# Patient Record
Sex: Male | Born: 1935 | Race: Black or African American | Hispanic: No | Marital: Single | Smoking: Never smoker
Health system: Southern US, Community
[De-identification: ages and names within clinical notes are randomized; demographics above are authoritative.]

## PROBLEM LIST (undated history)

## (undated) DIAGNOSIS — K5790 Diverticulosis of intestine, part unspecified, without perforation or abscess without bleeding: Secondary | ICD-10-CM

## (undated) DIAGNOSIS — Z0000 Encounter for general adult medical examination without abnormal findings: Secondary | ICD-10-CM

## (undated) DIAGNOSIS — E785 Hyperlipidemia, unspecified: Secondary | ICD-10-CM

## (undated) DIAGNOSIS — I252 Old myocardial infarction: Secondary | ICD-10-CM

## (undated) DIAGNOSIS — N19 Unspecified kidney failure: Secondary | ICD-10-CM

## (undated) DIAGNOSIS — D649 Anemia, unspecified: Secondary | ICD-10-CM

## (undated) DIAGNOSIS — I714 Abdominal aortic aneurysm, without rupture, unspecified: Secondary | ICD-10-CM

## (undated) DIAGNOSIS — C61 Malignant neoplasm of prostate: Secondary | ICD-10-CM

## (undated) DIAGNOSIS — M7989 Other specified soft tissue disorders: Secondary | ICD-10-CM

## (undated) DIAGNOSIS — M7020 Olecranon bursitis, unspecified elbow: Secondary | ICD-10-CM

## (undated) DIAGNOSIS — I10 Essential (primary) hypertension: Secondary | ICD-10-CM

## (undated) HISTORY — DX: Abdominal aortic aneurysm, without rupture, unspecified: I71.40

## (undated) HISTORY — DX: Unspecified kidney failure: N19

## (undated) HISTORY — DX: Diverticulosis of intestine, part unspecified, without perforation or abscess without bleeding: K57.90

## (undated) HISTORY — DX: Anemia, unspecified: D64.9

## (undated) HISTORY — DX: Malignant neoplasm of prostate: C61

## (undated) HISTORY — DX: Old myocardial infarction: I25.2

## (undated) HISTORY — DX: Other specified soft tissue disorders: M79.89

## (undated) HISTORY — DX: Olecranon bursitis, unspecified elbow: M70.20

## (undated) HISTORY — DX: Essential (primary) hypertension: I10

## (undated) HISTORY — DX: Abdominal aortic aneurysm, without rupture: I71.4

## (undated) HISTORY — DX: Hyperlipidemia, unspecified: E78.5

## (undated) HISTORY — DX: Encounter for general adult medical examination without abnormal findings: Z00.00

---

## 1997-09-22 ENCOUNTER — Ambulatory Visit: Admission: RE | Admit: 1997-09-22 | Discharge: 1997-09-22 | Payer: Self-pay | Admitting: Vascular Surgery

## 1997-12-20 ENCOUNTER — Inpatient Hospital Stay (HOSPITAL_COMMUNITY): Admission: RE | Admit: 1997-12-20 | Discharge: 1997-12-24 | Payer: Self-pay | Admitting: Vascular Surgery

## 1998-02-25 HISTORY — PX: OTHER SURGICAL HISTORY: SHX169

## 2000-04-15 ENCOUNTER — Emergency Department (HOSPITAL_COMMUNITY): Admission: EM | Admit: 2000-04-15 | Discharge: 2000-04-15 | Payer: Self-pay | Admitting: Emergency Medicine

## 2000-04-23 ENCOUNTER — Encounter: Payer: Self-pay | Admitting: Internal Medicine

## 2003-02-16 ENCOUNTER — Emergency Department (HOSPITAL_COMMUNITY): Admission: EM | Admit: 2003-02-16 | Discharge: 2003-02-16 | Payer: Self-pay | Admitting: Emergency Medicine

## 2004-01-27 ENCOUNTER — Ambulatory Visit: Payer: Self-pay | Admitting: Internal Medicine

## 2004-02-26 HISTORY — PX: PROSTATE CRYOABLATION: SUR358

## 2004-02-26 HISTORY — PX: SUPRAPUBIC CATHETER PLACEMENT: SHX2473

## 2004-02-26 HISTORY — PX: COLONOSCOPY: SHX174

## 2004-03-08 ENCOUNTER — Ambulatory Visit: Payer: Self-pay | Admitting: Internal Medicine

## 2004-04-19 ENCOUNTER — Ambulatory Visit: Payer: Self-pay | Admitting: Internal Medicine

## 2004-06-13 ENCOUNTER — Encounter: Admission: RE | Admit: 2004-06-13 | Discharge: 2004-06-13 | Payer: Self-pay | Admitting: Urology

## 2004-07-19 ENCOUNTER — Ambulatory Visit: Payer: Self-pay | Admitting: Internal Medicine

## 2004-08-17 ENCOUNTER — Ambulatory Visit (HOSPITAL_COMMUNITY): Admission: RE | Admit: 2004-08-17 | Discharge: 2004-08-17 | Payer: Self-pay | Admitting: Urology

## 2004-10-30 ENCOUNTER — Ambulatory Visit: Payer: Self-pay | Admitting: Internal Medicine

## 2004-12-17 ENCOUNTER — Ambulatory Visit: Payer: Self-pay | Admitting: Internal Medicine

## 2005-03-21 ENCOUNTER — Ambulatory Visit: Payer: Self-pay | Admitting: Internal Medicine

## 2005-06-21 ENCOUNTER — Ambulatory Visit: Payer: Self-pay | Admitting: Internal Medicine

## 2005-09-19 ENCOUNTER — Ambulatory Visit: Payer: Self-pay | Admitting: Internal Medicine

## 2005-12-20 ENCOUNTER — Ambulatory Visit: Payer: Self-pay | Admitting: Internal Medicine

## 2006-01-21 ENCOUNTER — Ambulatory Visit: Payer: Self-pay | Admitting: Internal Medicine

## 2006-02-12 ENCOUNTER — Ambulatory Visit: Payer: Self-pay | Admitting: Gastroenterology

## 2006-02-28 ENCOUNTER — Ambulatory Visit: Payer: Self-pay | Admitting: Gastroenterology

## 2006-04-15 ENCOUNTER — Ambulatory Visit: Payer: Self-pay | Admitting: Internal Medicine

## 2006-04-15 LAB — CONVERTED CEMR LAB
CO2: 34 meq/L — ABNORMAL HIGH (ref 19–32)
Calcium: 9.3 mg/dL (ref 8.4–10.5)
Chloride: 101 meq/L (ref 96–112)
GFR calc non Af Amer: 52 mL/min
Glucose, Bld: 120 mg/dL — ABNORMAL HIGH (ref 70–99)

## 2006-07-02 ENCOUNTER — Ambulatory Visit: Payer: Self-pay | Admitting: Internal Medicine

## 2006-07-02 LAB — CONVERTED CEMR LAB
Chloride: 107 meq/L (ref 96–112)
Creatinine, Ser: 1.6 mg/dL — ABNORMAL HIGH (ref 0.4–1.5)
Glucose, Bld: 91 mg/dL (ref 70–99)
Sodium: 144 meq/L (ref 135–145)

## 2006-12-05 DIAGNOSIS — R609 Edema, unspecified: Secondary | ICD-10-CM

## 2006-12-05 DIAGNOSIS — I252 Old myocardial infarction: Secondary | ICD-10-CM

## 2006-12-05 DIAGNOSIS — I10 Essential (primary) hypertension: Secondary | ICD-10-CM

## 2006-12-05 DIAGNOSIS — M109 Gout, unspecified: Secondary | ICD-10-CM

## 2006-12-05 DIAGNOSIS — K5730 Diverticulosis of large intestine without perforation or abscess without bleeding: Secondary | ICD-10-CM | POA: Insufficient documentation

## 2006-12-05 DIAGNOSIS — E785 Hyperlipidemia, unspecified: Secondary | ICD-10-CM

## 2006-12-05 DIAGNOSIS — C61 Malignant neoplasm of prostate: Secondary | ICD-10-CM

## 2007-01-23 ENCOUNTER — Ambulatory Visit: Payer: Self-pay | Admitting: Internal Medicine

## 2007-01-23 LAB — CONVERTED CEMR LAB
ALT: 25 units/L (ref 0–53)
Albumin: 4 g/dL (ref 3.5–5.2)
Alkaline Phosphatase: 56 units/L (ref 39–117)
BUN: 24 mg/dL — ABNORMAL HIGH (ref 6–23)
Basophils Absolute: 0 10*3/uL (ref 0.0–0.1)
Basophils Relative: 0.6 % (ref 0.0–1.0)
Bilirubin Urine: NEGATIVE
Calcium: 8.9 mg/dL (ref 8.4–10.5)
GFR calc Af Amer: 54 mL/min
GFR calc non Af Amer: 45 mL/min
HDL: 37.1 mg/dL — ABNORMAL LOW (ref >39.0)
Hemoglobin, Urine: NEGATIVE
Hemoglobin: 12.1 g/dL — ABNORMAL LOW (ref 13.0–17.0)
Leukocytes, UA: NEGATIVE
Lymphocytes Relative: 20.6 % (ref 12.0–46.0)
MCHC: 34.8 g/dL (ref 30.0–36.0)
Monocytes Absolute: 0.7 10*3/uL (ref 0.2–0.7)
Monocytes Relative: 9.6 % (ref 3.0–11.0)
Neutro Abs: 4.7 10*3/uL (ref 1.4–7.7)
Platelets: 174 10*3/uL (ref 150–400)
Potassium: 3.6 meq/L (ref 3.5–5.1)
Pro B Natriuretic peptide (BNP): 151 pg/mL — ABNORMAL HIGH (ref 0.0–100.0)
Specific Gravity, Urine: 1.01 (ref 1.000–1.03)
TSH: 2.43 microintl units/mL (ref 0.35–5.50)
Total CHOL/HDL Ratio: 4.9
Total Protein, Urine: NEGATIVE mg/dL
Total Protein: 7.8 g/dL (ref 6.0–8.3)
Triglycerides: 62 mg/dL (ref 0–149)
Urine Glucose: NEGATIVE mg/dL
VLDL: 12 mg/dL (ref 0–40)
pH: 6 (ref 5.0–8.0)

## 2007-02-27 ENCOUNTER — Encounter: Payer: Self-pay | Admitting: Internal Medicine

## 2007-07-30 ENCOUNTER — Ambulatory Visit: Payer: Self-pay | Admitting: Internal Medicine

## 2007-07-31 LAB — CONVERTED CEMR LAB
CO2: 30 meq/L (ref 19–32)
Chloride: 100 meq/L (ref 96–112)
Creatinine, Ser: 1.5 mg/dL (ref 0.4–1.5)
GFR calc non Af Amer: 48 mL/min
Potassium: 3.6 meq/L (ref 3.5–5.1)
Sodium: 139 meq/L (ref 135–145)

## 2007-08-27 ENCOUNTER — Encounter: Payer: Self-pay | Admitting: Internal Medicine

## 2007-09-18 ENCOUNTER — Encounter: Payer: Self-pay | Admitting: Internal Medicine

## 2008-02-01 ENCOUNTER — Ambulatory Visit: Payer: Self-pay | Admitting: Internal Medicine

## 2008-02-15 ENCOUNTER — Ambulatory Visit: Payer: Self-pay | Admitting: Internal Medicine

## 2008-03-15 ENCOUNTER — Encounter: Payer: Self-pay | Admitting: Internal Medicine

## 2008-03-16 ENCOUNTER — Inpatient Hospital Stay (HOSPITAL_COMMUNITY): Admission: EM | Admit: 2008-03-16 | Discharge: 2008-03-17 | Payer: Self-pay | Admitting: Emergency Medicine

## 2008-03-16 ENCOUNTER — Ambulatory Visit: Payer: Self-pay | Admitting: Internal Medicine

## 2008-03-16 ENCOUNTER — Ambulatory Visit: Payer: Self-pay | Admitting: Pulmonary Disease

## 2008-03-17 ENCOUNTER — Telehealth (INDEPENDENT_AMBULATORY_CARE_PROVIDER_SITE_OTHER): Payer: Self-pay | Admitting: *Deleted

## 2008-03-25 ENCOUNTER — Ambulatory Visit: Payer: Self-pay | Admitting: Internal Medicine

## 2008-03-25 LAB — CONVERTED CEMR LAB
Basophils Relative: 0.4 % (ref 0.0–3.0)
Eosinophils Relative: 1.4 % (ref 0.0–5.0)
HCT: 32.4 % — ABNORMAL LOW (ref 39.0–52.0)
Hemoglobin: 11.1 g/dL — ABNORMAL LOW (ref 13.0–17.0)
Monocytes Absolute: 0.7 10*3/uL (ref 0.1–1.0)
Monocytes Relative: 10.2 % (ref 3.0–12.0)
Neutro Abs: 4.8 10*3/uL (ref 1.4–7.7)
WBC: 7.2 10*3/uL (ref 4.5–10.5)

## 2008-03-29 ENCOUNTER — Ambulatory Visit: Payer: Self-pay | Admitting: Internal Medicine

## 2008-05-12 ENCOUNTER — Ambulatory Visit: Payer: Self-pay | Admitting: Internal Medicine

## 2008-05-12 DIAGNOSIS — D638 Anemia in other chronic diseases classified elsewhere: Secondary | ICD-10-CM | POA: Insufficient documentation

## 2008-05-13 ENCOUNTER — Telehealth (INDEPENDENT_AMBULATORY_CARE_PROVIDER_SITE_OTHER): Payer: Self-pay | Admitting: *Deleted

## 2008-05-13 LAB — CONVERTED CEMR LAB
ALT: 28 units/L (ref 0–53)
Basophils Relative: 0.5 % (ref 0.0–3.0)
Bilirubin, Direct: 0.1 mg/dL (ref 0.0–0.3)
Chloride: 101 meq/L (ref 96–112)
Creatinine, Ser: 1.6 mg/dL — ABNORMAL HIGH (ref 0.4–1.5)
Eosinophils Absolute: 0.1 10*3/uL (ref 0.0–0.7)
LDL Cholesterol: 73 mg/dL (ref 0–99)
MCHC: 33.9 g/dL (ref 30.0–36.0)
MCV: 93.2 fL (ref 78.0–100.0)
Monocytes Absolute: 0.9 10*3/uL (ref 0.1–1.0)
Neutro Abs: 6.1 10*3/uL (ref 1.4–7.7)
Neutrophils Relative %: 71.6 % (ref 43.0–77.0)
Potassium: 4 meq/L (ref 3.5–5.1)
RBC: 3.56 M/uL — ABNORMAL LOW (ref 4.22–5.81)
Sodium: 140 meq/L (ref 135–145)
Total Bilirubin: 0.8 mg/dL (ref 0.3–1.2)
Total CHOL/HDL Ratio: 3

## 2008-08-15 ENCOUNTER — Ambulatory Visit: Payer: Self-pay | Admitting: Internal Medicine

## 2008-09-23 ENCOUNTER — Encounter: Payer: Self-pay | Admitting: Internal Medicine

## 2008-11-14 ENCOUNTER — Ambulatory Visit: Payer: Self-pay | Admitting: Internal Medicine

## 2008-11-14 LAB — CONVERTED CEMR LAB
BUN: 25 mg/dL — ABNORMAL HIGH (ref 6–23)
Calcium: 9 mg/dL (ref 8.4–10.5)
Creatinine, Ser: 1.6 mg/dL — ABNORMAL HIGH (ref 0.4–1.5)
Eosinophils Relative: 1.2 % (ref 0.0–5.0)
GFR calc non Af Amer: 53.69 mL/min (ref >60)
Lymphocytes Relative: 13.3 % (ref 12.0–46.0)
Monocytes Relative: 11.1 % (ref 3.0–12.0)
Neutrophils Relative %: 73.6 % (ref 43.0–77.0)
Platelets: 151 10*3/uL (ref 150.0–400.0)
Potassium: 3.7 meq/L (ref 3.5–5.1)
WBC: 9 10*3/uL (ref 4.5–10.5)

## 2009-02-06 ENCOUNTER — Ambulatory Visit: Payer: Self-pay | Admitting: Internal Medicine

## 2009-02-08 LAB — CONVERTED CEMR LAB
ALT: 38 units/L (ref 0–53)
BUN: 30 mg/dL — ABNORMAL HIGH (ref 6–23)
Basophils Absolute: 0.1 10*3/uL (ref 0.0–0.1)
Bilirubin, Direct: 0.1 mg/dL (ref 0.0–0.3)
CRP, High Sensitivity: 19.2 — ABNORMAL HIGH (ref 0.00–5.00)
Cholesterol: 141 mg/dL (ref 0–200)
Creatinine, Ser: 1.6 mg/dL — ABNORMAL HIGH (ref 0.4–1.5)
Eosinophils Relative: 1.8 % (ref 0.0–5.0)
GFR calc non Af Amer: 53.66 mL/min (ref >60)
Glucose, Bld: 116 mg/dL — ABNORMAL HIGH (ref 70–99)
LDL Cholesterol: 87 mg/dL (ref 0–99)
Leukocytes, UA: NEGATIVE
MCV: 96.1 fL (ref 78.0–100.0)
Monocytes Absolute: 0.7 10*3/uL (ref 0.1–1.0)
Neutrophils Relative %: 68.4 % (ref 43.0–77.0)
Nitrite: NEGATIVE
Platelets: 206 10*3/uL (ref 150.0–400.0)
RDW: 12.1 % (ref 11.5–14.6)
Specific Gravity, Urine: <=1.005 (ref 1.000–1.030)
TSH: 1.83 microintl units/mL (ref 0.35–5.50)
Total Bilirubin: 1.1 mg/dL (ref 0.3–1.2)
Triglycerides: 49 mg/dL (ref 0.0–149.0)
Uric Acid, Serum: 10 mg/dL — ABNORMAL HIGH (ref 4.0–7.8)
Urine Glucose: NEGATIVE mg/dL
Urobilinogen, UA: 0.2 (ref 0.0–1.0)
VLDL: 9.8 mg/dL (ref 0.0–40.0)
WBC: 7.2 10*3/uL (ref 4.5–10.5)

## 2009-03-21 ENCOUNTER — Ambulatory Visit: Payer: Self-pay | Admitting: Internal Medicine

## 2009-03-24 ENCOUNTER — Encounter: Payer: Self-pay | Admitting: Internal Medicine

## 2009-04-20 ENCOUNTER — Ambulatory Visit: Payer: Self-pay | Admitting: Internal Medicine

## 2009-04-21 LAB — CONVERTED CEMR LAB
ALT: 33 units/L (ref 0–53)
Albumin: 3.8 g/dL (ref 3.5–5.2)
BUN: 26 mg/dL — ABNORMAL HIGH (ref 6–23)
Chloride: 104 meq/L (ref 96–112)
GFR calc non Af Amer: 46.82 mL/min (ref >60)
Potassium: 3.8 meq/L (ref 3.5–5.1)
Sodium: 141 meq/L (ref 135–145)
Total Protein: 7.8 g/dL (ref 6.0–8.3)
Uric Acid, Serum: 5.2 mg/dL (ref 4.0–7.8)

## 2009-04-25 ENCOUNTER — Telehealth (INDEPENDENT_AMBULATORY_CARE_PROVIDER_SITE_OTHER): Payer: Self-pay | Admitting: *Deleted

## 2009-07-11 ENCOUNTER — Ambulatory Visit: Payer: Self-pay | Admitting: Internal Medicine

## 2009-07-12 ENCOUNTER — Telehealth: Payer: Self-pay | Admitting: Internal Medicine

## 2009-07-12 ENCOUNTER — Encounter: Payer: Self-pay | Admitting: Internal Medicine

## 2009-07-12 ENCOUNTER — Ambulatory Visit: Payer: Self-pay

## 2009-07-12 LAB — CONVERTED CEMR LAB
ALT: 30 units/L (ref 0–53)
Albumin: 4 g/dL (ref 3.5–5.2)
Alkaline Phosphatase: 66 units/L (ref 39–117)
Basophils Relative: 0.1 % (ref 0.0–3.0)
Bilirubin, Direct: 0.1 mg/dL (ref 0.0–0.3)
CO2: 32 meq/L (ref 19–32)
Chloride: 103 meq/L (ref 96–112)
Creatinine, Ser: 1.7 mg/dL — ABNORMAL HIGH (ref 0.4–1.5)
Eosinophils Relative: 1.2 % (ref 0.0–5.0)
Hemoglobin: 11 g/dL — ABNORMAL LOW (ref 13.0–17.0)
Lymphocytes Relative: 13.6 % (ref 12.0–46.0)
MCHC: 34.1 g/dL (ref 30.0–36.0)
MCV: 94.5 fL (ref 78.0–100.0)
Monocytes Absolute: 0.8 10*3/uL (ref 0.1–1.0)
Neutro Abs: 5.7 10*3/uL (ref 1.4–7.7)
Neutrophils Relative %: 74.3 % (ref 43.0–77.0)
RBC: 3.4 M/uL — ABNORMAL LOW (ref 4.22–5.81)
Sodium: 145 meq/L (ref 135–145)
Total Protein: 7.2 g/dL (ref 6.0–8.3)
WBC: 7.6 10*3/uL (ref 4.5–10.5)

## 2009-08-22 ENCOUNTER — Ambulatory Visit: Payer: Self-pay | Admitting: Internal Medicine

## 2009-08-24 LAB — CONVERTED CEMR LAB
Basophils Absolute: 0 10*3/uL (ref 0.0–0.1)
Eosinophils Absolute: 0.1 10*3/uL (ref 0.0–0.7)
Folate: 19.7 ng/mL
HCT: 32.3 % — ABNORMAL LOW (ref 39.0–52.0)
Iron: 31 ug/dL — ABNORMAL LOW (ref 42–165)
Lymphs Abs: 1 10*3/uL (ref 0.7–4.0)
MCHC: 33.4 g/dL (ref 30.0–36.0)
MCV: 95.3 fL (ref 78.0–100.0)
Monocytes Absolute: 0.9 10*3/uL (ref 0.1–1.0)
Neutrophils Relative %: 78 % — ABNORMAL HIGH (ref 43.0–77.0)
Platelets: 196 10*3/uL (ref 150.0–400.0)
RDW: 14.2 % (ref 11.5–14.6)
Transferrin: 200.5 mg/dL — ABNORMAL LOW (ref 212.0–360.0)

## 2009-09-11 ENCOUNTER — Ambulatory Visit: Payer: Self-pay | Admitting: Internal Medicine

## 2009-09-12 LAB — CONVERTED CEMR LAB
Fecal Occult Blood: NEGATIVE
OCCULT 3: NEGATIVE
OCCULT 4: NEGATIVE
OCCULT 5: NEGATIVE

## 2009-09-29 ENCOUNTER — Encounter: Payer: Self-pay | Admitting: Internal Medicine

## 2009-10-16 ENCOUNTER — Ambulatory Visit: Payer: Self-pay | Admitting: Internal Medicine

## 2009-10-17 LAB — CONVERTED CEMR LAB
Basophils Absolute: 0 10*3/uL (ref 0.0–0.1)
Calcium: 9.2 mg/dL (ref 8.4–10.5)
Eosinophils Relative: 1.5 % (ref 0.0–5.0)
GFR calc non Af Amer: 53.18 mL/min (ref >60)
Glucose, Bld: 73 mg/dL (ref 70–99)
Hemoglobin: 11 g/dL — ABNORMAL LOW (ref 13.0–17.0)
Lymphocytes Relative: 15.7 % (ref 12.0–46.0)
Monocytes Relative: 9.6 % (ref 3.0–12.0)
Neutro Abs: 4.6 10*3/uL (ref 1.4–7.7)
RDW: 14.4 % (ref 11.5–14.6)
Sodium: 142 meq/L (ref 135–145)
WBC: 6.4 10*3/uL (ref 4.5–10.5)

## 2010-02-08 ENCOUNTER — Ambulatory Visit: Payer: Self-pay | Admitting: Internal Medicine

## 2010-02-08 ENCOUNTER — Encounter: Payer: Self-pay | Admitting: Internal Medicine

## 2010-02-08 LAB — CONVERTED CEMR LAB
AST: 48 units/L — ABNORMAL HIGH (ref 0–37)
Alkaline Phosphatase: 58 units/L (ref 39–117)
BUN: 26 mg/dL — ABNORMAL HIGH (ref 6–23)
Basophils Absolute: 0 10*3/uL (ref 0.0–0.1)
Calcium: 8.8 mg/dL (ref 8.4–10.5)
Cholesterol: 133 mg/dL (ref 0–200)
GFR calc non Af Amer: 52.76 mL/min — ABNORMAL LOW (ref >60.00)
Glucose, Bld: 93 mg/dL (ref 70–99)
HDL: 38.4 mg/dL — ABNORMAL LOW (ref >39.00)
Lymphocytes Relative: 14.5 % (ref 12.0–46.0)
Monocytes Relative: 10.2 % (ref 3.0–12.0)
Platelets: 166 10*3/uL (ref 150.0–400.0)
RDW: 13.6 % (ref 11.5–14.6)
Sodium: 141 meq/L (ref 135–145)
Specific Gravity, Urine: 1.01 (ref 1.000–1.030)
TSH: 3.14 microintl units/mL (ref 0.35–5.50)
Total Bilirubin: 0.9 mg/dL (ref 0.3–1.2)
Total Protein, Urine: NEGATIVE mg/dL
Urine Glucose: NEGATIVE mg/dL
Urobilinogen, UA: 0.2 (ref 0.0–1.0)
VLDL: 10.6 mg/dL (ref 0.0–40.0)

## 2010-03-25 LAB — CONVERTED CEMR LAB
Basophils Absolute: 0.2 10*3/uL — ABNORMAL HIGH (ref 0.0–0.1)
Basophils Relative: 2.1 % (ref 0.0–3.0)
Calcium: 9.1 mg/dL (ref 8.4–10.5)
Chloride: 102 meq/L (ref 96–112)
Cholesterol: 174 mg/dL (ref 0–200)
Creatinine, Ser: 1.6 mg/dL — ABNORMAL HIGH (ref 0.4–1.5)
Eosinophils Absolute: 0.2 10*3/uL (ref 0.0–0.7)
GFR calc non Af Amer: 45 mL/min
MCHC: 35 g/dL (ref 30.0–36.0)
MCV: 92.9 fL (ref 78.0–100.0)
Neutro Abs: 5 10*3/uL (ref 1.4–7.7)
Neutrophils Relative %: 69.7 % (ref 43.0–77.0)
RBC: 3.6 M/uL — ABNORMAL LOW (ref 4.22–5.81)
RDW: 12.8 % (ref 11.5–14.6)
Sodium: 141 meq/L (ref 135–145)
TSH: 2.4 microintl units/mL (ref 0.35–5.50)
Total Bilirubin: 1 mg/dL (ref 0.3–1.2)
Triglycerides: 63 mg/dL (ref 0–149)
VLDL: 13 mg/dL (ref 0–40)

## 2010-03-29 NOTE — Assessment & Plan Note (Signed)
Summary: Primary svc/ eval L leg swlling   Primary Provider/Referring Provider:  [name]  CC:  3 month followup.  Pt c/o swelling in left foot since had gout x 1 month ago.  He denies any other complaints today.Marland Kitchen  History of Present Illness: 89 yobm  with difficult to control hypertension complicated by mild chronic renal insufficiency and tendency to chronic leg swelling.  Developed Gout summer of 2009,  saw gout doctor ? given colchicine.  February 01, 2008 comprehensive eval no more gout, does not know gout doctor's name, did not bring in med calendar as requested  February 15, 2008 --returns for follow up, med review. . LDL goal <70, last visit, LDL 124. Discussed options, pt agreed to go on statin therapy along with lifestyle modifications.   March 29, 2008 after hosp discharge for gib on asa,  did not recur once asa stopped, GI svc saw pt and rec conservative rx for presumed diverticular bleed.   No abd pain/wt loss.  May 12, 2008 ov   main complaint is flareup of gout in the right hand.  It just started colchicine seen the day before office visit, no diarrhea yet  and only took avfew pills so far.  no fever, gout resolved and did not need to continue colchicine.  August 15, 2008 ov no change in rx  November 14, 2008 ov c/o mild increase in Left leg swelling when can't take furosemide due to activities planned x 1 month - in past both legs swelled, now just the left, no pain, no better after elevation. Pt denies any significant sore throat, dysphagia, itching, sneezing,  nasal congestion or excess secretions,  fever, chills, sweats, unintended wt loss, pleuritic or exertional cp, hempoptysis, change in activity tolerance  orthopnea, pnd.   March 21, 2009 6 wk followup.  Pt states no complaints today. Uric acid 10 on eval and no recent flares of gout but pt concerned about cost of even as needed colchcine.  no tia/claudication.  rec start allopurinol  April 20, 2009 ov minimal  gouty c/os or need for colchicine  Jul 11, 2009 3 month followup.  Pt c/o swelling in left foot since had gout x 1 month ago.  He denies any other complaints today. no pain does not resolved over night  Current Medications (verified): 1)  Clonidine Hcl 0.2 Mg  Tabs (Clonidine Hcl) .... 1/2 Tab in The Morning, 1 Whole Tab At Bedtime 2)  Protegra   Caps (Multiple Vitamins-Minerals) .... Once Daily 3)  Atenolol 100 Mg  Tabs (Atenolol) .... 1/2 Once Daily 4)  Furosemide 80 Mg  Tabs (Furosemide) .Marland Kitchen.. 1 Whole Tab By Mouth  Every Morning, 1/2 Tab Every Evening 5)  Minoxidil 10 Mg  Tabs (Minoxidil) .Marland Kitchen.. 1 Whole Tab By Mouth  Every Morning, 1/2 Tab in The Evening 6)  Simvastatin 20 Mg Tabs (Simvastatin) .Marland Kitchen.. 1 By Mouth At Bedtime 7)  Colchicine 0.6 Mg Tabs (Colchicine) .Marland Kitchen.. 1 Tab By Mouth Every 6 Hours As Needed 8)  Allopurinol 300 Mg Tabs (Allopurinol) .... One Daily  Allergies (verified): No Known Drug Allergies  Past History:  Past Medical History: Diverticulosis..............................................................Marland KitchenPatterson     - Colonoscopy 02/28/06     -  LGIB  03/15/2008 while  on asa, dc'd Gout     - Uric acid 10 01/2009 > allopurinol started March 21, 2009  Left leg swelling onset 05/2009     - Venous dopplers ordered Jul 11, 2009 >>> Hyperlipidemia     -  Target LDL < 70 (hbp, PVD) Myocardial infarction, hx of OLECRANON BURSITIIS........................................................Marland KitchenYates Renal failure baseline creat 1.5 Hypertension PROSTATE CA......................................................................Marland KitchenWrenn      - Cryotherapy 1/06 HEALTH MAINTENANCE.........................................................Marland KitchenWert     - DT 9/03     - Pneumovax 9/04     - CPX  February 06, 2009  Complex Med regimen     -Meds calendar adjust-February 15, 2008   Vital Signs:  Patient profile:   76 year old male Weight:      203 pounds O2 Sat:      97 % on Room air Temp:      98.4 degrees F oral Pulse rate:   61 / minute BP sitting:   126 / 74  (left arm)  Vitals Entered By: Vernie Murders (Jul 11, 2009 10:31 AM)  O2 Flow:  Room air  Physical Exam  Additional Exam:  Ambulatory healthy appearing in no acute distress. Afeb with normal vital signs   wt  206 March 29, 2008 >  201 February 06, 2009 > 201 March 21, 2009 > 206 April 20, 2009 > 203 Jul 11, 2009  HEENT: nl dentition, turbinates, and orophanx. EAC clear with coarse ear hair- no wax seen TMs normal Neck without JVD/Nodes/TM Lungs clear to A and P bilaterally without cough on insp or exp maneuvers RRR no s3 or murmur, mod pitting on left knee down, trace on right Abd soft and benign with nl excursion in the supine position. No bruits or organomegaly Ext warm without calf tenderness, cyanosis clubbing. Neg Homan's    Sodium                    145 mEq/L                   135-145   Potassium                 4.0 mEq/L                   3.5-5.1   Chloride                  103 mEq/L                   96-112   Carbon Dioxide            32 mEq/L                    19-32   Glucose                   94 mg/dL                    16-10   BUN                  [H]  32 mg/dL                    9-60   Creatinine           [H]  1.7 mg/dL                   4.5-4.0   Calcium                   9.1 mg/dL                   9.8-11.9   GFR  49.65 mL/min                >60  Tests: (2) CBC Platelet w/Diff (CBCD)   White Cell Count          7.6 K/uL                    4.5-10.5   Red Cell Count       [L]  3.40 Mil/uL                 4.22-5.81   Hemoglobin           [L]  11.0 g/dL                   78.2-95.6   Hematocrit           [L]  32.2 %                      39.0-52.0   MCV                       94.5 fl                     78.0-100.0   MCHC                      34.1 g/dL                   21.3-08.6   RDW                       14.6 %                      11.5-14.6   Platelet Count             166.0 K/uL                  150.0-400.0   Neutrophil %              74.3 %                      43.0-77.0   Lymphocyte %              13.6 %                      12.0-46.0   Monocyte %                10.8 %                      3.0-12.0   Eosinophils%              1.2 %                       0.0-5.0   Basophils %               0.1 %                       0.0-3.0   Neutrophill Absolute      5.7 K/uL                    1.4-7.7   Lymphocyte Absolute       1.0 K/uL  0.7-4.0   Monocyte Absolute         0.8 K/uL                    0.1-1.0  Eosinophils, Absolute                             0.1 K/uL                    0.0-0.7   Basophils Absolute        0.0 K/uL                    0.0-0.1  Tests: (3) Hepatic/Liver Function Panel (HEPATIC)   Total Bilirubin           0.5 mg/dL                   1.6-1.0   Direct Bilirubin          0.1 mg/dL                   9.6-0.4   Alkaline Phosphatase      66 U/L                      39-117   AST                  [H]  43 U/L                      0-37   ALT                       30 U/L                      0-53   Total Protein             7.2 g/dL                    5.4-0.9   Albumin                   4.0 g/dL                    8.1-1.9  Tests: (4) TSH (TSH)   FastTSH                   2.69 uIU/mL                 0.35-5.50  Tests: (5) B-Type Natiuretic Peptide (BNPR)  B-Type Natriuetic Peptide                        [H]  408.3 pg/mL                 0.0-100.0  Impression & Recommendations:  Problem # 1:  EDEMA, CHRONIC (ICD-782.3)  Orders: TLB-Hepatic/Liver Function Pnl (80076-HEPATIC) TLB-TSH (Thyroid Stimulating Hormone) (84443-TSH) TLB-BNP (B-Natriuretic Peptide) (83880-BNPR) Misc. Referral (Misc. Ref) Misc. Referral (Misc. Ref) Est. Patient Level IV ([address])  now asym and persistent, r/o dvt  See instructions for specific recommendations   Problem # 2:  HYPERTENSION (ICD-401.9)  ok on rx:  His updated medication list for  this problem includes:    Clonidine Hcl 0.2 Mg Tabs (Clonidine hcl) .Marland Kitchen... 1/2 tab in the morning, 1 whole tab  at bedtime    Atenolol 100 Mg Tabs (Atenolol) .Marland Kitchen... 1/2 once daily    Furosemide 80 Mg Tabs (Furosemide) .Marland Kitchen... 1 whole tab by mouth  every morning, 1/2 tab every evening    Minoxidil 10 Mg Tabs (Minoxidil) .Marland Kitchen... 1 whole tab by mouth  every morning, 1/2 tab in the evening  Orders: Est. Patient Level IV ([address])  Problem # 3:  RENAL FAILURE (ICD-586)  Orders: TLB-BMP (Basic Metabolic Panel-BMET) (80048-METABOL) Est. Patient Level IV (60454)  Labs Reviewed: BUN: 26 (04/20/2009)   Cr: 1.8 (04/20/2009)   > 1.7  Jul 11, 2009  Hgb: 12.5 (02/06/2009)   Hct: 37.8 (02/06/2009)   Ca++: 9.0 (04/20/2009)    TP: 7.8 (04/20/2009)   Alb: 3.8 (04/20/2009)  Problem # 4:  GOUT (ICD-274.9)  His updated medication list for this problem includes:    Colchicine 0.6 Mg Tabs (Colchicine) .Marland Kitchen... 1 tab by mouth every 6 hours as needed    Allopurinol 100 Mg Tabs (Allopurinol) ..... One daily  Orders: Prescription Created Electronically [phone])  Given renal insufficiency probably ok to use the 100 mg dose unless more freq flares erupt.   Each maintenance medication was reviewed in detail including most importantly the difference between maintenance and as needed and under what circumstances the prns are to be used. This was done in the context of a medication calendar review which provided the patient with a user-friendly unambiguous mechanism for medication administration and reconciliation and provides an action plan for all active problems. It is critical that this be shown to every doctor  for modification during the office visit if necessary so the patient can use it as a working document.   Problem # 5:  ANEMIA (ICD-285.9)  Orders: TLB-CBC Platelet - w/Differential (85025-CBCD) Est. Patient Level IV (99214)  Hct drifting down again, reccheck with fe studies next ov  Medications Added to  Medication List This Visit: 1)  Allopurinol 100 Mg Tabs (Allopurinol) .... One daily  Patient Instructions: 1)  See Patient Care Coordinator before leaving for venous dopplers 2)  Elevate leg above heart while sleeping 3)  See Tammy NP w/in 6 weeks with all your medications, even over the counter meds, separated in two separate bags, the ones you take no matter what vs the ones you stop once you feel better and take only as needed.  She will generate for you a new user friendly medication calendar that will put [place] all on the same page re: your medication use.  4)  NEEDS RECHECK CBC AND FE STUDIES NEXT OV Prescriptions: ALLOPURINOL 100 MG TABS (ALLOPURINOL) one daily  #30 x 11   Entered and Authorized by:   Nyoka Cowden MD   Signed by:   Nyoka Cowden MD on 07/11/2009   Method used:   Electronically to        [facility]. [phone]* (retail)       [address]       [place], [place]  [address]       Ph: [phone]       Fax: [phone]   RxID:   [id number]

## 2010-03-29 NOTE — Assessment & Plan Note (Signed)
Summary: Primary svc/  hyperuricemia, start allopurinol   Primary Provider/Referring Provider:  [name]  CC:  6 wk followup.  Pt states no complaints today.Marland Kitchen  History of Present Illness: 74 yobm  with difficult to control hypertension complicated by mild chronic renal insufficiency and tendency to chronic leg swelling.  Developed Gout summer of 2009,  saw gout doctor ? given colchicine.  February 01, 2008 comprehensive eval no more gout, does not know gout doctor's name, did not bring in med calendar as requested  February 15, 2008 --returns for follow up, med review. . LDL goal <70, last visit, LDL 124. Discussed options, pt agreed to go on statin therapy along with lifestyle modifications.   March 29, 2008 after hosp discharge for gib on asa,  did not recur once asa stopped, GI svc saw pt and rec conservative rx for presumed diverticular bleed.   No abd pain/wt loss.  May 12, 2008 ov   main complaint is flareup of gout in the right hand.  It just started colchicine seen the day before office visit, no diarrhea yet  and only took avfew pills so far.  no fever, gout resolved and did not need to continue colchicine.  August 15, 2008 ov no change in rx  November 14, 2008 ov c/o mild increase in leg swelling when can't take furosemide due to activities planned.   February 06, 2009 ov for comprehensive eval, no new c/o's, leg swelling tolerable. March 21, 2009 6 wk followup.  Pt states no complaints today. Uric acid 10 on eval and no recent flares of gout but pt concerned about cost of even as needed colchcine.  no tia/claudication.   Pt denies any significant sore throat, dysphagia, itching, sneezing,  nasal congestion or excess secretions,  fever, chills, sweats, unintended wt loss, pleuritic or exertional cp, hempoptysis, change in activity tolerance  orthopnea pnd or leg swelling   Current Medications (verified): 1)  Clonidine Hcl 0.2 Mg  Tabs (Clonidine Hcl) .... 1/2 Tab in The  Morning, 1 Whole Tab At Bedtime 2)  Protegra   Caps (Multiple Vitamins-Minerals) .... Once Daily 3)  Atenolol 100 Mg  Tabs (Atenolol) .... 1/2 Once Daily 4)  Furosemide 80 Mg  Tabs (Furosemide) .Marland Kitchen.. 1 Whole Tab By Mouth  Every Morning, 1/2 Tab Every Evening 5)  Minoxidil 10 Mg  Tabs (Minoxidil) .Marland Kitchen.. 1 Whole Tab By Mouth  Every Morning, 1/2 Tab in The Evening 6)  Simvastatin 20 Mg Tabs (Simvastatin) .Marland Kitchen.. 1 By Mouth At Bedtime 7)  Colchicine 0.6 Mg Tabs (Colchicine) .Marland Kitchen.. 1 Tab By Mouth Every 6 Hours As Needed  Allergies (verified): No Known Drug Allergies  Past History:  Past Medical History: Diverticulosis..............................................................Marland KitchenPatterson     - Colonoscopy 02/28/06     -  LGIB  03/15/2008 while  on asa, dc'd Gout     - Uric acid 10 01/2009 > allopurinol started March 21, 2009  Hyperlipidemia     - Target LDL < 70 (hbp, PVD) Myocardial infarction, hx of OLECRANON BURSITIIS.......................................................Marland KitchenYates Renal failure baseline creat 1.5 Hypertension PROSTATE CA......................................................................Marland KitchenWrenn      - Cryotherapy 1/06 HEALTH MAINTENANCE.........................................................Marland KitchenWert     - DT 9/03     - Pneumovax 9/04     - CPX  February 06, 2009  Complex Med regimen     -Meds calendar adjust-February 15, 2008   Vital Signs:  Patient profile:   77 year old male Weight:      201 pounds BMI:  28.94 O2 Sat:      99 % on Room air Temp:     97.4 degrees F oral Pulse rate:   60 / minute BP sitting:   130 / 86  (right arm)  Vitals Entered By: Vernie Murders (March 21, 2009 9:28 AM)  O2 Flow:  Room air  Physical Exam  Additional Exam:  Ambulatory healthy appearing in no acute distress. Afeb with normal vital signs   wt  206 March 29, 2008 > 205 May 12, 2008  > 201 November 14, 2008 > 201 February 06, 2009 > 201 March 21, 2009  HEENT: nl  dentition, turbinates, and orophanx. EAC clear with coarse ear hair- no wax seen TMs normal Neck without JVD/Nodes/TM Lungs clear to A and P bilaterally without cough on insp or exp maneuvers RRR no s3 or murmur,  mild pitting edema both feet Abd soft and benign with nl excursion in the supine position. No bruits or organomegaly Ext warm without calf tenderness, cyanosis clubbing    Impression & Recommendations:  Problem # 1:  HYPERTENSION (ICD-401.9)  ok on rx His updated medication list for this problem includes:    Clonidine Hcl 0.2 Mg Tabs (Clonidine hcl) .Marland Kitchen... 1/2 tab in the morning, 1 whole tab at bedtime    Atenolol 100 Mg Tabs (Atenolol) .Marland Kitchen... 1/2 once daily    Furosemide 80 Mg Tabs (Furosemide) .Marland Kitchen... 1 whole tab by mouth  every morning, 1/2 tab every evening    Minoxidil 10 Mg Tabs (Minoxidil) .Marland Kitchen... 1 whole tab by mouth  every morning, 1/2 tab in the evening  Orders: Est. Patient Level III (04540)  Problem # 2:  RENAL FAILURE (ICD-586)  Labs Reviewed: BUN: 30 (02/06/2009)   Cr: 1.6 (02/06/2009)    Hgb: 12.5 (02/06/2009)   Hct: 37.8 (02/06/2009)   Ca++: 9.1 (02/06/2009)    TP: 8.8 (02/06/2009)   Alb: 4.0 (02/06/2009)  Orders: Est. Patient Level III (98119)  Problem # 3:  GOUT (ICD-274.9)  His updated medication list for this problem includes:    Colchicine 0.6 Mg Tabs (Colchicine) .Marland Kitchen... 1 tab by mouth every 6 hours as needed    Allopurinol 300 Mg Tabs (Allopurinol) ..... One daily    Colchicine 0.6 Mg Tabs (Colchicine) ..... One twice daily as needed    Each maintenance medication was reviewed in detail including most importantly the difference between maintenance and as needed and under what circumstances the prns are to be used. This was done in the context of a medication calendar review which provided the patient with a user-friendly unambiguous mechanism for medication administration and reconciliation and provides an action plan for all active problems. It is  critical that this be shown to every doctor  for modification during the office visit if necessary so the patient can use it as a working document.   Orders: Est. Patient Level III (14782)  Medications Added to Medication List This Visit: 1)  Allopurinol 300 Mg Tabs (Allopurinol) .... One daily 2)  Colchicine 0.6 Mg Tabs (Colchicine) .... One twice daily as needed  Patient Instructions: 1)  Start Allopurinol daily 2)  Please schedule a follow-up appointment in 4 weeks, sooner if needed to recheck your uric acid level 3)  See calendar for specific medication instructions and bring it back for each and every office visit for every healthcare provider you see.  Without it,  you may not receive the best quality medical care that we feel you deserve.  Prescriptions: COLCHICINE 0.6  MG TABS (COLCHICINE) one twice daily as needed  #15 x 11   Entered and Authorized by:   Nyoka Cowden MD   Signed by:   Nyoka Cowden MD on 03/21/2009   Method used:   Electronically to        [facility]. [phone]* (retail)       [address]       [place], [place]  [address]       Ph: [phone]       Fax: [phone]   RxID:   [id number] ALLOPURINOL 300 MG TABS (ALLOPURINOL) one daily  #30 x 11   Entered and Authorized by:   Nyoka Cowden MD   Signed by:   Nyoka Cowden MD on 03/21/2009   Method used:   Electronically to        [facility]. [phone]* (retail)       [address]       [place], [place]  [address]       Ph: [phone]       Fax: [phone]   RxID:   [id number]

## 2010-03-29 NOTE — Assessment & Plan Note (Signed)
Summary: 6 wk med cal ///kp   Primary Provider/Referring Provider:  [name]  CC:  Med Calander.  Pt brought all meds today.  Marland Kitchen  History of Present Illness: 68 yobm  with difficult to control hypertension complicated by mild chronic renal insufficiency and tendency to chronic leg swelling.  Developed Gout summer of 2009,  saw gout doctor ? given colchicine.  February 01, 2008 comprehensive eval no more gout, does not know gout doctor's name, did not bring in med calendar as requested  February 15, 2008 --returns for follow up, med review. . LDL goal <70, last visit, LDL 124. Discussed options, pt agreed to go on statin therapy along with lifestyle modifications.   March 29, 2008 after hosp discharge for gib on asa,  did not recur once asa stopped, GI svc saw pt and rec conservative rx for presumed diverticular bleed.   No abd pain/wt loss.  May 12, 2008 ov   main complaint is flareup of gout in the right hand.  It just started colchicine seen the day before office visit, no diarrhea yet  and only took avfew pills so far.  no fever, gout resolved and did not need to continue colchicine.  August 15, 2008 ov no change in rx  November 14, 2008 ov c/o mild increase in Left leg swelling when can't take furosemide due to activities planned x 1 month - in past both legs swelled, now just the left, no pain, no better after elevation. Pt denies any significant sore throat, dysphagia, itching, sneezing,  nasal congestion or excess secretions,  fever, chills, sweats, unintended wt loss, pleuritic or exertional cp, hempoptysis, change in activity tolerance  orthopnea, pnd.    March 21, 2009 6 wk followup.  Pt states no complaints today. Uric acid 10 on eval and no recent flares of gout but pt concerned about cost of even as needed colchcine.  no tia/claudication.  rec start allopurinol  April 20, 2009 ov minimal gouty c/os or need for colchicine  Jul 11, 2009 3 month followup.  Pt c/o swelling in  left foot since had gout x 1 month ago.  He denies any other complaints today. no pain does not resolved over night  August 22, 2009--Returns for follow up and med review. He is doing well since last visit. No major gout flares since last visit.  Had venous dopplers last visit which were neg. Labs showed no sign. change in renal insufficiency w/ sCr at 1.7. Mild anemia, hgb sl down at 11.0. We reviewed meds today and updated his med calendar. Denies chest pain, dyspnea, orthopnea, hemoptysis, fever, n/v/d, edema, headache,  bloody stools. Last colon. showed devertics in 2008.   Allergies (verified): No Known Drug Allergies  Past History:  Past Surgical History: Last updated: 02/01/2008 R IA Aneurysmectomy...............................................Marland KitchenEarly  Family History: Last updated: 02/01/2008 HBP brother Pancreatic ca brother CVA  father and ? mother  Social History: Last updated: 02/06/2009 Never smoker No ETOH Retired, stays busy with church activities, no aerobics Single  Risk Factors: Smoking Status: never (12/05/2006)  Past Medical History: Diverticulosis..............................................................Marland KitchenPatterson     - Colonoscopy 02/28/06     -  LGIB  03/15/2008 while  on asa, dc'd Gout     - Uric acid 10 01/2009 > allopurinol started March 21, 2009  Left leg swelling onset 05/2009     - Venous dopplers ordered Jul 11, 2009 >>>neg  Hyperlipidemia     - Target LDL < 70 (hbp, PVD) Myocardial infarction,  hx of OLECRANON BURSITIIS........................................................Marland KitchenYates Renal failure baseline creat 1.5 Hypertension PROSTATE CA......................................................................Marland KitchenWrenn      - Cryotherapy 1/06 ANEMIA       -08/22/09 hbg 11.0, iron studies> HEALTH MAINTENANCE.........................................................Marland KitchenWert     - DT 9/03     - Pneumovax 9/04     - CPX  February 06, 2009  Complex Med  regimen     -Meds calendar adjust-February 15, 2008 , August 22, 2009   Review of Systems      See HPI  Vital Signs:  Patient profile:   77 year old male Height:      70 inches Weight:      201 pounds BMI:     28.94 O2 Sat:      95 % on Room air Temp:     97.9 degrees F oral Pulse rate:   59 / minute BP sitting:   140 / 88  (left arm) Cuff size:   regular  Vitals Entered By: Gweneth Dimitri RN (August 22, 2009 9:04 AM)  O2 Flow:  Room air CC: Med Calander.  Pt brought all meds today.   Is Patient Diabetic? No Comments Medications reviewed with patient Daytime contact number verified with patient. Gweneth Dimitri RN  August 22, 2009 9:05 AM    Physical Exam  Additional Exam:  Ambulatory healthy appearing in no acute distress. Afeb with normal vital signs   wt  206 March 29, 2008 >  201 February 06, 2009 > 201 March 21, 2009 > 206 April 20, 2009 > 203 Jul 11, 2009 >>201 August 22, 2009 HEENT: nl dentition, turbinates, and orophanx. EAC clear with coarse ear hair- no wax seen TMs normal Neck without JVD/Nodes/TM Lungs clear to A and P bilaterally without cough on insp or exp maneuvers RRR no s3 or murmur, , bilateral 1-2 + edema  Abd soft and benign with nl excursion in the supine position. No bruits or organomegaly Ext warm without calf tenderness, cyanosis clubbing. Neg Homan's    Impression & Recommendations:  Problem # 1:  ANEMIA (ICD-285.9) Sl tr down in hgb, will check iron panel.  follow up on labs.  Orders: TLB-CBC Platelet - w/Differential (85025-CBCD) TLB-IBC Pnl (Iron/FE;Transferrin) (83550-IBC) TLB-B12 + Folate Pnl (11914_78295-A21/HYQ) Est. Patient Level IV (65784)  Problem # 2:  HYPERTENSION (ICD-401.9)  controlled on rx  His updated medication list for this problem includes:    Clonidine Hcl 0.2 Mg Tabs (Clonidine hcl) .Marland Kitchen... 1/2 tab in the morning, 1 whole tab at bedtime    Atenolol 100 Mg Tabs (Atenolol) .Marland Kitchen... 1/2 once daily    Furosemide 80 Mg  Tabs (Furosemide) .Marland Kitchen... 1 whole tab by mouth  every morning, 1/2 tab every evening    Minoxidil 10 Mg Tabs (Minoxidil) .Marland Kitchen... 1 whole tab by mouth  every morning, 1/2 tab in the evening  Orders: Est. Patient Level IV (69629)  Problem # 3:  GOUT (ICD-274.9)  controlled on rx  His updated medication list for this problem includes:    Colchicine 0.6 Mg Tabs (Colchicine) .Marland Kitchen... 1 tab by mouth every 6 hours as needed    Allopurinol 100 Mg Tabs (Allopurinol) ..... One daily  Orders: Est. Patient Level IV (52841)  Complete Medication List: 1)  Clonidine Hcl 0.2 Mg Tabs (Clonidine hcl) .... 1/2 tab in the morning, 1 whole tab at bedtime 2)  Protegra Caps (Multiple vitamins-minerals) .... Once daily 3)  Atenolol 100 Mg Tabs (Atenolol) .... 1/2 once daily 4)  Furosemide 80 Mg Tabs (  Furosemide) .Marland KitchenMarland KitchenMarland Kitchen 1 whole tab by mouth  every morning, 1/2 tab every evening 5)  Minoxidil 10 Mg Tabs (Minoxidil) .Marland KitchenMarland KitchenMarland Kitchen 1 whole tab by mouth  every morning, 1/2 tab in the evening 6)  Simvastatin 20 Mg Tabs (Simvastatin) .Marland Kitchen.. 1 by mouth at bedtime 7)  Colchicine 0.6 Mg Tabs (Colchicine) .Marland Kitchen.. 1 tab by mouth every 6 hours as needed 8)  Allopurinol 100 Mg Tabs (Allopurinol) .... One daily  Patient Instructions: 1)  Follow med calendar closely and bring to each visit.  2)  I will call with lab results.  3)  follow up Dr. [name] in 3 months and as needed     Appended Document: med calendar update Medications Added FUROSEMIDE 80 MG  TABS (FUROSEMIDE) 1 whole tab by mouth  every morning, 1/2 tab at lunch MINOXIDIL 10 MG  TABS (MINOXIDIL) 1 whole tab by mouth  every morning, 1/2 tab at bedtime          Clinical Lists Changes  Medications: Changed medication from FUROSEMIDE 80 MG  TABS (FUROSEMIDE) 1 whole tab by mouth  every morning, 1/2 tab every evening to FUROSEMIDE 80 MG  TABS (FUROSEMIDE) 1 whole tab by mouth  every morning, 1/2 tab at lunch Changed medication from MINOXIDIL 10 MG  TABS (MINOXIDIL) 1 whole tab  by mouth  every morning, 1/2 tab in the evening to MINOXIDIL 10 MG  TABS (MINOXIDIL) 1 whole tab by mouth  every morning, 1/2 tab at bedtime

## 2010-03-29 NOTE — Assessment & Plan Note (Signed)
Summary: Primary svc/ ext ov with multiple chronic problems   Primary Provider/Referring Provider:  [name]  CC:  Followup.  Pt states has been doing well.  No complaints today.Marland Kitchen  History of Present Illness: 77 yobm  with difficult to control hypertension complicated by mild chronic renal insufficiency and tendency to chronic leg swelling.  Developed Gout summer of 2009,  saw gout doctor ? given colchicine.  February 01, 2008 comprehensive eval no more gout, does not know gout doctor's name, did not bring in med calendar as requested  February 15, 2008 --returns for follow up, med review. . LDL goal <70, last visit, LDL 124. Discussed options, pt agreed to go on statin therapy along with lifestyle modifications.   March 29, 2008 after hosp discharge for gib on asa,  did not recur once asa stopped, GI svc saw pt and rec conservative rx for presumed diverticular bleed.   No abd pain/wt loss.  May 12, 2008 ov   main complaint is flareup of gout in the right hand.  It just started colchicine seen the day before office visit, no diarrhea yet  and only took avfew pills so far.  no fever, gout resolved and did not need to continue colchicine.  August 15, 2008 ov no change in rx  November 14, 2008 ov c/o mild increase in Left leg swelling when can't take furosemide due to activities planned x 1 month - in past both legs swelled, now just the left, no pain, no better after elevation. Pt denies any significant sore throat, dysphagia, itching, sneezing,  nasal congestion or excess secretions,  fever, chills, sweats, unintended wt loss, pleuritic or exertional cp, hempoptysis, change in activity tolerance  orthopnea, pnd.    March 21, 2009 6 wk followup.  Pt states no complaints today. Uric acid 10 on eval and no recent flares of gout but pt concerned about cost of even as needed colchcine.  no tia/claudication.  rec start allopurinol  April 20, 2009 ov minimal gouty c/os or need for  colchicine  Jul 11, 2009 3 month followup.  Pt c/o swelling in left foot since had gout x 1 month ago.  He denies any other complaints today. no pain does not resolved over night  August 22, 2009--Returns for follow up and med review. He is doing well since last visit. No major gout flares since last visit.  Had venous dopplers last visit which were neg. Labs showed no sign. change in renal insufficiency w/ sCr at 1.7. Mild anemia, hgb sl down at 11.0. We reviewed meds today and updated his med calendar.   October 16, 2009 Followup.  Pt states has been doing well.  No complaints today. legs swell the more he's on them. No orthopnea, doe or cps tia or claudicaiton.  Current Medications (verified): 1)  Clonidine Hcl 0.2 Mg  Tabs (Clonidine Hcl) .... 1/2 Tab in The Morning, 1 Whole Tab At Bedtime 2)  Protegra   Caps (Multiple Vitamins-Minerals) .... Once Daily 3)  Atenolol 100 Mg  Tabs (Atenolol) .... 1/2 Once Daily 4)  Furosemide 80 Mg  Tabs (Furosemide) .Marland Kitchen.. 1 Whole Tab By Mouth  Every Morning, 1/2 Tab At Lunch 5)  Minoxidil 10 Mg  Tabs (Minoxidil) .Marland Kitchen.. 1 Whole Tab By Mouth  Every Morning, 1/2 Tab At Bedtime 6)  Allopurinol 100 Mg Tabs (Allopurinol) .... One Daily 7)  Simvastatin 20 Mg Tabs (Simvastatin) .Marland Kitchen.. 1 By Mouth At Bedtime 8)  Colchicine 0.6 Mg Tabs (Colchicine) .Marland KitchenMarland KitchenMarland Kitchen  1 Tab By Mouth Every 6 Hours As Needed 9)  Ferrous Sulfate 325 (65 Fe) Mg Tabs (Ferrous Sulfate) .... Take 1 Tablet By Mouth Two Times A Day  Allergies (verified): No Known Drug Allergies  Past History:  Past Medical History: Diverticulosis..............................................................Marland KitchenPatterson     - Colonoscopy 02/28/06     -  LGIB  03/15/2008 while  on asa, dc'd Gout     - Uric acid 10 01/2009 > allopurinol started March 21, 2009  Left leg swelling onset 05/2009     - Venous dopplers ordered Jul 11, 2009 >>>neg  Hyperlipidemia     - Target LDL < 70 (hbp, PVD) Myocardial infarction, hx of OLECRANON  BURSITIIS........................................................Marland KitchenYates Renal failure baseline creat 1.5 Hypertension PROSTATE CA......................................................................Marland KitchenWrenn      - Cryotherapy 1/06 ANEMIA       -08/22/09 hbg 11.0, iron studies> 11%  HEALTH MAINTENANCE..........................................................Marland KitchenWert     - DT 9/03     - Pneumovax 9/04     - CPX  February 06, 2009  Complex Med regimen     -Meds calendar adjust-February 15, 2008 , August 22, 2009   Vital Signs:  Patient profile:   77 year old male Weight:      204 pounds O2 Sat:      95 % on Room air Temp:     98.2 degrees F oral Pulse rate:   56 / minute BP sitting:   128 / 78  (left arm)  Vitals Entered By: Vernie Murders (October 16, 2009 10:49 AM)  O2 Flow:  Room air  Physical Exam  Additional Exam:  Ambulatory healthy appearing elderly bm  in no acute distress.  wt  206 March 29, 2008 >   201 March 21, 2009   > 203 Jul 11, 2009 >>201 August 22, 2009 > 204 October 16, 2009  HEENT: nl dentition, turbinates, and orophanx. EAC clear with coarse ear hair- no wax seen TMs normal Neck without JVD/Nodes/TM Lungs clear to A and P bilaterally without cough on insp or exp maneuvers RRR no s3 or murmur, , bilateral 1-2 + edema  Abd soft and benign with nl excursion in the supine position. No bruits or organomegaly Ext warm without calf tenderness, cyanosis clubbing. Neg Homan's    Sodium                    142 mEq/L                   135-145   Potassium                 3.9 mEq/L                   3.5-5.1   Chloride                  100 mEq/L                   96-112   Carbon Dioxide       [H]  33 mEq/L                    19-32   Glucose                   73 mg/dL                    40-98   BUN                  [  H]  28 mg/dL                    7-82   Creatinine           [H]  1.6 mg/dL                   9.5-6.2   Calcium                   9.2 mg/dL                    1.3-08.6   GFR                       53.18 mL/min                >60  Tests: (2) CBC Platelet w/Diff (CBCD)   White Cell Count          6.4 K/uL                    4.5-10.5   Red Cell Count       [L]  3.41 Mil/uL                 4.22-5.81   Hemoglobin           [L]  11.0 g/dL                   57.8-46.9   Hematocrit           [L]  32.1 %                      39.0-52.0   MCV                       94.1 fl                     78.0-100.0   MCHC                      34.2 g/dL                   62.9-52.8   RDW                       14.4 %                      11.5-14.6   Platelet Count            184.0 K/uL                  150.0-400.0   Neutrophil %              72.8 %                      43.0-77.0   Lymphocyte %              15.7 %                      12.0-46.0   Monocyte %                9.6 %                       3.0-12.0  Eosinophils%              1.5 %                       0.0-5.0   Basophils %               0.4 %                       0.0-3.0   Neutrophill Absolute      4.6 K/uL                    1.4-7.7   Lymphocyte Absolute       1.0 K/uL                    0.7-4.0   Monocyte Absolute         0.6 K/uL                    0.1-1.0  Eosinophils, Absolute                             0.1 K/uL                    0.0-0.7   Basophils Absolute        0.0 K/uL                    0.0-0.1  Impression & Recommendations:  Problem # 1:  RENAL FAILURE (ICD-586)  Orders: Est. Patient Level IV (04540) TLB-BMP (Basic Metabolic Panel-BMET) (80048-METABOL) TLB-CBC Platelet - w/Differential (85025-CBCD)  Labs Reviewed: BUN: 32 (07/11/2009)   Cr: 1.7 (07/11/2009)    >  October 16, 2009 = 1.6  Hgb: 10.8 (08/22/2009)   Hct: 32.3 (08/22/2009)   Ca++: 9.1 (07/11/2009)    TP: 7.2 (07/11/2009)   Alb: 4.0 (07/11/2009)  Problem # 2:  HYPERTENSION (ICD-401.9)  His updated medication list for this problem includes:    Clonidine Hcl 0.2 Mg Tabs (Clonidine hcl) .Marland Kitchen... 1/2 tab in the morning, 1 whole tab  at bedtime    Atenolol 100 Mg Tabs (Atenolol) .Marland Kitchen... 1/2 once daily    Furosemide 80 Mg Tabs (Furosemide) .Marland Kitchen... 1 whole tab by mouth  every morning, 1/2 tab at lunch    Minoxidil 10 Mg Tabs (Minoxidil) .Marland Kitchen... 1 whole tab by mouth  every morning, 1/2 tab at bedtime  Orders: Est. Patient Level IV (98119)  Problem # 3:  EDEMA, CHRONIC (ICD-782.3)  venous dopplers reviewed   Each maintenance medication was reviewed in detail including most importantly the difference between maintenance and as needed and under what circumstances the prns are to be used. This was done in the context of a medication calendar review which provided the patient with a user-friendly unambiguous mechanism for medication administration and reconciliation and provides an action plan for all active problems. It is critical that this be shown to every doctor  for modification during the office visit if necessary so the patient can use it as a working document.   Orders: Est. Patient Level IV (14782)  Problem # 4:  ANEMIA (ICD-285.9) s/p GIB secondary to divertiulitis  His updated medication list for this problem includes:    Ferrous Sulfate 325 (65 Fe) Mg Tabs (Ferrous sulfate) .Marland Kitchen... Take 1 tablet by mouth two times a day   Fesat 11% in June  HGB 10.8 > 11.0 October 16, 2009  so no change in rx  Patient Instructions: 1)  See calendar for specific medication instructions and bring it back for each and every office visit for every healthcare provider you see.  Without it,  you may not receive the best quality medical care that we feel you deserve. 2)  CPX due on Dec 13 or after, return in meantime if needed

## 2010-03-29 NOTE — Progress Notes (Signed)
Summary: diarrhea  Phone Note Call from Patient Call back at Home Phone [phone]   Caller: Patient Call For: wert Summary of Call: pt c/o diarrhea/ runny x 1 day "several times last night and again this morning after breakfast. denies fever/ vomiting. says this is "slightly uncomfortable". has not taken any OTC meds. please advise.  Initial call taken by: Tivis Ringer, CNA,  April 25, 2009 10:29 AM  Follow-up for Phone Call        told pt to try immodium for the diarrhea, pt not having any other symptoms told pt if further symptoms develop or if the diarrhea gets worse to give [place] a call back Follow-up by: Philipp Deputy CMA,  April 25, 2009 10:37 AM

## 2010-03-29 NOTE — Letter (Signed)
Summary: [facility]   [facility]   Imported By: Lennie Odor 03/30/2009 15:17:11  _____________________________________________________________________  External Attachment:    Type:   Image     Comment:   External Document

## 2010-03-29 NOTE — Assessment & Plan Note (Signed)
Summary: Primary svc/ f/u gout/ cri   Primary Provider/Referring Provider:  [name]  CC:  1 month followup.  Pt states no complaints today.Marland Kitchen  History of Present Illness: 4 yobm  with difficult to control hypertension complicated by mild chronic renal insufficiency and tendency to chronic leg swelling.  Developed Gout summer of 2009,  saw gout doctor ? given colchicine.  February 01, 2008 comprehensive eval no more gout, does not know gout doctor's name, did not bring in med calendar as requested  February 15, 2008 --returns for follow up, med review. . LDL goal <70, last visit, LDL 124. Discussed options, pt agreed to go on statin therapy along with lifestyle modifications.   March 29, 2008 after hosp discharge for gib on asa,  did not recur once asa stopped, GI svc saw pt and rec conservative rx for presumed diverticular bleed.   No abd pain/wt loss.  May 12, 2008 ov   main complaint is flareup of gout in the right hand.  It just started colchicine seen the day before office visit, no diarrhea yet  and only took avfew pills so far.  no fever, gout resolved and did not need to continue colchicine.  August 15, 2008 ov no change in rx  November 14, 2008 ov c/o mild increase in leg swelling when can't take furosemide due to activities planned.   February 06, 2009 ov for comprehensive eval, no new c/o's, leg swelling tolerable. March 21, 2009 6 wk followup.  Pt states no complaints today. Uric acid 10 on eval and no recent flares of gout but pt concerned about cost of even as needed colchcine.  no tia/claudication.  rec start allopurinol  April 20, 2009 ov minimal gouty c/os or need for colchicine.  Pt denies any significant sore throat, dysphagia, itching, sneezing,  nasal congestion or excess secretions,  fever, chills, sweats, unintended wt loss, pleuritic or exertional cp, hempoptysis, change in activity tolerance  orthopnea pnd or leg swelling   Current Medications (verified): 1)   Clonidine Hcl 0.2 Mg  Tabs (Clonidine Hcl) .... 1/2 Tab in The Morning, 1 Whole Tab At Bedtime 2)  Protegra   Caps (Multiple Vitamins-Minerals) .... Once Daily 3)  Atenolol 100 Mg  Tabs (Atenolol) .... 1/2 Once Daily 4)  Furosemide 80 Mg  Tabs (Furosemide) .Marland Kitchen.. 1 Whole Tab By Mouth  Every Morning, 1/2 Tab Every Evening 5)  Minoxidil 10 Mg  Tabs (Minoxidil) .Marland Kitchen.. 1 Whole Tab By Mouth  Every Morning, 1/2 Tab in The Evening 6)  Simvastatin 20 Mg Tabs (Simvastatin) .Marland Kitchen.. 1 By Mouth At Bedtime 7)  Colchicine 0.6 Mg Tabs (Colchicine) .Marland Kitchen.. 1 Tab By Mouth Every 6 Hours As Needed 8)  Allopurinol 300 Mg Tabs (Allopurinol) .... One Daily  Allergies (verified): No Known Drug Allergies  Vital Signs:  Patient profile:   77 year old male Weight:      206.25 pounds O2 Sat:      98 % on Room air Temp:     97.9 degrees F oral Pulse rate:   72 / minute BP sitting:   140 / 78  (left arm)  Vitals Entered By: Vernie Murders (April 20, 2009 11:24 AM)  O2 Flow:  Room air  Physical Exam  Additional Exam:  Ambulatory healthy appearing in no acute distress. Afeb with normal vital signs   wt  206 March 29, 2008 > 205 May 12, 2008   201 February 06, 2009 > 201 March 21, 2009 >  206 April 20, 2009  HEENT: nl dentition, turbinates, and orophanx. EAC clear with coarse ear hair- no wax seen TMs normal Neck without JVD/Nodes/TM Lungs clear to A and P bilaterally without cough on insp or exp maneuvers RRR no s3 or murmur,  mild pitting edema both feet Abd soft and benign with nl excursion in the supine position. No bruits or organomegaly Ext warm without calf tenderness, cyanosis clubbing  Total Bilirubin           0.5 mg/dL                   1.6-1.0   Direct Bilirubin          0.2 mg/dL                   9.6-0.4   Alkaline Phosphatase      64 U/L                      39-117   AST                  [H]  47 U/L                      0-37   ALT                       33 U/L                      0-53    Total Protein             7.8 g/dL                    5.4-0.9   Albumin                   3.8 g/dL                    8.1-1.9  Tests: (2) BMP (METABOL)   Sodium                    141 mEq/L                   135-145   Potassium                 3.8 mEq/L                   3.5-5.1   Chloride                  104 mEq/L                   96-112   Carbon Dioxide       [H]  33 mEq/L                    19-32   Glucose                   83 mg/dL                    14-78   BUN                  [H]  26 mg/dL                    2-95  Creatinine           [H]  1.8 mg/dL                   1.6-1.0   Calcium                   9.0 mg/dL                   9.6-04.5   GFR                       46.82 mL/min                >60  Tests: (3) Uric Acid (URIC)   Uric Acid                 5.2 mg/dL                   4.0-9.8   Impression & Recommendations:  Problem # 1:  RENAL FAILURE (ICD-586)  Orders: TLB-BMP (Basic Metabolic Panel-BMET) (80048-METABOL) Est. Patient Level III (11914)  Labs Reviewed: BUN: 30 (02/06/2009)   Cr: 1.6 (02/06/2009)    > 1.8 April 20, 2009  Hgb: 12.5 (02/06/2009)   Hct: 37.8 (02/06/2009)   Ca++: 9.1 (02/06/2009)    TP: 8.8 (02/06/2009)   Alb: 4.0 (02/06/2009)  Problem # 2:  GOUT (ICD-274.9)  The following medications were removed from the medication list:    Colchicine 0.6 Mg Tabs (Colchicine) ..... One twice daily as needed His updated medication list for this problem includes:    Colchicine 0.6 Mg Tabs (Colchicine) .Marland Kitchen... 1 tab by mouth every 6 hours as needed    Allopurinol 300 Mg Tabs (Allopurinol) ..... One daily  Uric acid down to acceptable range, no change in rx  Problem # 3:  HYPERTENSION (ICD-401.9)  ok on rx His updated medication list for this problem includes:    Clonidine Hcl 0.2 Mg Tabs (Clonidine hcl) .Marland Kitchen... 1/2 tab in the morning, 1 whole tab at bedtime    Atenolol 100 Mg Tabs (Atenolol) .Marland Kitchen... 1/2 once daily    Furosemide 80 Mg Tabs (Furosemide)  .Marland Kitchen... 1 whole tab by mouth  every morning, 1/2 tab every evening    Minoxidil 10 Mg Tabs (Minoxidil) .Marland Kitchen... 1 whole tab by mouth  every morning, 1/2 tab in the evening  Orders: Est. Patient Level III (78295)  Other Orders: TLB-Hepatic/Liver Function Pnl (80076-HEPATIC) TLB-Uric Acid, Blood (84550-URIC)  Patient Instructions: 1)  Return to office in 3 months, sooner if needed  2)  See calendar for specific medication instructions and bring it back for each and every office visit for every healthcare provider you see.  Without it,  you may not receive the best quality medical care that we feel you deserve.

## 2010-03-29 NOTE — Miscellaneous (Signed)
Summary: Orders Update  Clinical Lists Changes  Orders: Added new Test order of Venous Duplex Lower Extremity (Venous Duplex Lower) - Signed 

## 2010-03-29 NOTE — Progress Notes (Signed)
Summary: results  Phone Note Call from Patient Call back at Home Phone [phone]   Caller: Patient Call For: Harrison Zetina Reason for Call: Talk to Nurse Summary of Call: returned call from nurse re: blood results. Initial call taken by: Eugene Gavia,  Jul 12, 2009 2:18 PM  Follow-up for Phone Call        Pt advised of MW results. Zackery Barefoot CMA  Jul 12, 2009 2:23 PM

## 2010-03-29 NOTE — Letter (Signed)
[facility]   Imported By: Sherian Rein 10/05/2009 14:48:31  _____________________________________________________________________  External Attachment:    Type:   Image     Comment:   External Document

## 2010-03-29 NOTE — Assessment & Plan Note (Signed)
Summary: Primary svc/ cpx    Primary Provider/Referring Provider:  [name]  CC:  cpx fasting.  History of Present Illness: 55  yobm  with difficult to control hypertension complicated by mild chronic renal insufficiency and tendency to chronic leg swelling.  Developed Gout summer of 2009,  saw gout doctor ? given colchicine.  February 01, 2008 comprehensive eval no more gout, does not know gout doctor's name, did not bring in med calendar as requested  February 15, 2008 --returns for follow up, med review. . LDL goal <70, last visit, LDL 124. Discussed options, pt agreed to go on statin therapy along with lifestyle modifications.   March 29, 2008 after hosp discharge for gib on asa,  did not recur once asa stopped, GI svc saw pt and rec conservative rx for presumed diverticular bleed.   No abd pain/wt loss. maintain off asa  August 22, 2009--Returns for follow up and med review. He is doing well since last visit. No major gout flares since last visit.  Had venous dopplers last visit which were neg. Labs showed no sign. change in renal insufficiency w/ sCr at 1.7. Mild anemia, hgb sl down at 11.0. We reviewed meds today and updated his med calendar.   February 08, 2010 cpx no new complaints, occ tylenol no nsaids needed. Leg swelling L > R no change. Pt denies any significant sore throat, dysphagia, itching, sneezing,  nasal congestion or excess secretions,  fever, chills, sweats, unintended wt loss, pleuritic or exertional cp, hempoptysis, change in activity tolerance  orthopnea pnd / tia or claudication.        Current Medications (verified): 1)  Clonidine Hcl 0.2 Mg  Tabs (Clonidine Hcl) .... 1/2 Tab in The Morning, 1 Whole Tab At Bedtime 2)  Protegra   Caps (Multiple Vitamins-Minerals) .... Once Daily 3)  Atenolol 100 Mg  Tabs (Atenolol) .... 1/2 Once Daily 4)  Furosemide 80 Mg  Tabs (Furosemide) .Marland Kitchen.. 1 Whole Tab By Mouth  Every Morning, 1/2 Tab At Lunch 5)  Minoxidil 10 Mg  Tabs  (Minoxidil) .Marland Kitchen.. 1 Whole Tab By Mouth  Every Morning, 1/2 Tab At Bedtime 6)  Allopurinol 100 Mg Tabs (Allopurinol) .... One Daily 7)  Simvastatin 20 Mg Tabs (Simvastatin) .Marland Kitchen.. 1 By Mouth At Bedtime 8)  Colchicine 0.6 Mg Tabs (Colchicine) .Marland Kitchen.. 1 Tab By Mouth Every 6 Hours As Needed 9)  Ferrous Sulfate 325 (65 Fe) Mg Tabs (Ferrous Sulfate) .... Take 1 Tablet By Mouth Two Times A Day  Allergies (verified): No Known Drug Allergies  Past History:  Past Medical History: Diverticulosis..............................................................Marland KitchenPatterson     - Colonoscopy 02/28/06     -  LGIB  03/15/2008 while  on asa, dc'd Gout     - Uric acid 10 01/2009 > allopurinol started March 21, 2009  Left leg swelling onset 05/2009     - Venous dopplers ordered Jul 11, 2009 >>>neg  Hyperlipidemia     - Target LDL < 70 (hbp, PVD) Myocardial infarction, hx of OLECRANON BURSITIIS........................................................Marland KitchenYates Renal failure baseline creat 1.5 Hypertension PROSTATE CA......................................................................Marland KitchenWrenn      - Cryotherapy 02/2004 ANEMIA       -08/22/09 hbg 11.0, iron studies> 11%  HEALTH MAINTENANCE..........................................................Marland KitchenWert     - DT 9/03     - Pneumovax 9/04     - CPX  February 08, 2010  Complex Med regimen     -Meds calendar adjust-February 15, 2008 , August 22, 2009   Family History: Reviewed history from 02/01/2008  and no changes required. HBP brother Pancreatic ca brother CVA  father and ? mother  Social History: Reviewed history from 02/06/2009 and no changes required. Never smoker No ETOH Retired, stays busy with church activities, no aerobics Single  Vital Signs:  Patient profile:   77 year old male Weight:      205 pounds O2 Sat:      98 % on Room air Temp:     97.8 degrees F oral Pulse rate:   58 / minute BP sitting:   130 / 80  (left arm)  Vitals Entered By: Vernie Murders (February 08, 2010 8:41 AM)  O2 Flow:  Room air  Physical Exam  Additional Exam:  Ambulatory healthy appearing elderly bm  in no acute distress.  wt  206 March 29, 2008 >   201 March 21, 2009   > 203 Jul 11, 2009 >  204 October 16, 2009 > 205 February 08, 2010  HEENT: nl dentition, turbinates, and orophanx. EAC clear with coarse ear hair- no wax seen TMs normal Neck without JVD/Nodes/TM Lungs clear to A and P bilaterally without cough on insp or exp maneuvers RRR no s3 or murmur, , bilateral  mod L, mild pitting R Abd soft and benign with nl excursion in the supine position. No bruits or organomegaly Ext warm without calf tenderness, cyanosis clubbing. Neg Homan's MS  Kyphotic spine, moderate, o/w no obvious joint deformity, restriction, nl gait Neuro alert and approp no motor or cerebellar deficits, nl recall   Cholesterol               133 mg/dL                   5-784     ATP III Classification            Desirable:  < 200 mg/dL                    Borderline High:  200 - 239 mg/dL               High:  > = 240 mg/dL   Triglycerides             53.0 mg/dL                  6.9-629.5     Normal:  <150 mg/dL     Borderline High:  284 - 199 mg/dL   HDL                  [L]  13.24 mg/dL                 >40.10   VLDL Cholesterol          10.6 mg/dL                  2.7-25.3   LDL Cholesterol           84 mg/dL                    6-64  CHO/HDL Ratio:  CHD Risk                             3                    Men          Women  1/2 Average Risk     3.4          3.3     Average Risk          5.0          4.4     2X Average Risk          9.6          7.1     3X Average Risk          15.0          11.0                           Tests: (2) BMP (METABOL)   Sodium                    141 mEq/L                   135-145   Potassium                 3.8 mEq/L                   3.5-5.1   Chloride                  101 mEq/L                   96-112   Carbon Dioxide            32  mEq/L                    19-32   Glucose                   93 mg/dL                    82-95   BUN                  [H]  26 mg/dL                    6-21   Creatinine           [H]  1.6 mg/dL                   3.0-8.6   Calcium                   8.8 mg/dL                   5.7-84.6   GFR                  [L]  52.76 mL/min                >60.00  Tests: (3) CBC Platelet w/Diff (CBCD)   White Cell Count          7.4 K/uL                    4.5-10.5   Red Cell Count       [L]  3.62 Mil/uL                 4.22-5.81   Hemoglobin           [L]  11.9 g/dL  13.0-17.0   Hematocrit           [L]  34.8 %                      39.0-52.0   MCV                       96.0 fl                     78.0-100.0   MCHC                      34.2 g/dL                   04.5-40.9   RDW                       13.6 %                      11.5-14.6   Platelet Count            166.0 K/uL                  150.0-400.0   Neutrophil %              73.8 %                      43.0-77.0   Lymphocyte %              14.5 %                      12.0-46.0   Monocyte %                10.2 %                      3.0-12.0   Eosinophils%              1.1 %                       0.0-5.0   Basophils %               0.4 %                       0.0-3.0   Neutrophill Absolute      5.5 K/uL                    1.4-7.7   Lymphocyte Absolute       1.1 K/uL                    0.7-4.0   Monocyte Absolute         0.8 K/uL                    0.1-1.0  Eosinophils, Absolute                             0.1 K/uL                    0.0-0.7   Basophils Absolute        0.0 K/uL  0.0-0.1  Tests: (4) Hepatic/Liver Function Panel (HEPATIC)   Total Bilirubin           0.9 mg/dL                   5.9-5.6   Direct Bilirubin          0.1 mg/dL                   3.8-7.5   Alkaline Phosphatase      58 U/L                      39-117   AST                  [H]  48 U/L                      0-37   ALT                       31  U/L                      0-53   Total Protein             7.1 g/dL                    6.4-3.3   Albumin                   3.8 g/dL                    2.9-5.1  Tests: (5) TSH (TSH)   FastTSH                   3.14 uIU/mL                 0.35-5.50  Tests: (6) Uric Acid (URIC)   Uric Acid                 7.5 mg/dL                   8.8-4.1  Tests: (7) UDip Only (UDIP)   Color                     LT. YELLOW       RANGE:  Yellow;Lt. Yellow   Clarity                   CLEAR                       Clear   Specific Gravity          1.010                       1.000 - 1.030   Urine Ph                  6.5                         5.0-8.0   Protein                   NEGATIVE                    Negative   Urine Glucose  NEGATIVE                    Negative   Ketones                   NEGATIVE                    Negative   Urine Bilirubin           NEGATIVE                    Negative   Blood                     TRACE-INTACT                Negative   Urobilinogen              0.2                         0.0 - 1.0   Leukocyte Esterace        NEGATIVE                    Negative   Nitrite                   NEGATIVE                    Negative  CXR  Procedure date:  02/08/2010  Findings:      Marked enlargement cardiopericardial silhouette.  No acute finding. Stable compared prior exam.  Impression & Recommendations:  Problem # 1:  HYPERTENSION (ICD-401.9) ok on rx  His updated medication list for this problem includes:    Clonidine Hcl 0.2 Mg Tabs (Clonidine hcl) .Marland Kitchen... 1/2 tab in the morning, 1 whole tab at bedtime    Atenolol 100 Mg Tabs (Atenolol) .Marland Kitchen... 1/2 once daily    Furosemide 80 Mg Tabs (Furosemide) .Marland Kitchen... 1 whole tab by mouth  every morning, 1/2 tab at lunch    Minoxidil 10 Mg Tabs (Minoxidil) .Marland Kitchen... 1 whole tab by mouth  every morning, 1/2 tab at bedtime  Problem # 2:  RENAL FAILURE (ICD-586)  Labs Reviewed: BUN: 28 (10/16/2009)   Cr: 1.6 (10/16/2009)     vs   =     1.6  February 08, 2010  Hgb: 11.0 (10/16/2009)   Hct: 32.1 (10/16/2009)   Ca++: 9.2 (10/16/2009)    TP: 7.2 (07/11/2009)   Alb: 4.0 (07/11/2009)  Problem # 3:  GOUT (ICD-274.9)  His updated medication list for this problem includes:    Allopurinol 100 Mg Tabs (Allopurinol) ..... One daily    Colchicine 0.6 Mg Tabs (Colchicine) .Marland Kitchen... 1 tab by mouth every 6 hours as needed   U.A.  5.2 >  7.5  February 08, 2010 but no attacks  Problem # 4:  EDEMA, CHRONIC (ICD-782.3) L > R without change  Problem # 5:  ANEMIA (ICD-285.9)  His updated medication list for this problem includes:    Ferrous Sulfate 325 (65 Fe) Mg Tabs (Ferrous sulfate) .Marland Kitchen... Take 1 tablet by mouth two times a day  11.0 > 11.9 February 08, 2010 so no change rx  Problem # 6:  HYPERLIPIDEMIA (ICD-272.4)  His updated medication list for this problem includes:    Simvastatin 20 Mg Tabs (Simvastatin) .Marland Kitchen... 1 by mouth at bedtime  Labs Reviewed: SGOT: 43 (07/11/2009)  SGPT: 30 (07/11/2009)   HDL:43.90 (02/06/2009), 34.40 (05/12/2008)  LDL:87 (02/06/2009), 73 (05/12/2008)  > 84 February 08, 2010   Chol:141 (02/06/2009), 117 (05/12/2008)  Trig:49.0 (02/06/2009), 48.0 (05/12/2008)  Other Orders: EKG w/ Interpretation (93000) Est. Patient 65& > (60454) Flu Vaccine 89yrs + MEDICARE PATIENTS (U9811) Administration Flu vaccine - MCR (G0008) T-2 View CXR (71020TC) TLB-Lipid Panel (80061-LIPID) TLB-BMP (Basic Metabolic Panel-BMET) (80048-METABOL) TLB-CBC Platelet - w/Differential (85025-CBCD) TLB-Hepatic/Liver Function Pnl (80076-HEPATIC) TLB-TSH (Thyroid Stimulating Hormone) (84443-TSH) TLB-Uric Acid, Blood (84550-URIC) TLB-Udip ONLY (81003-UDIP)  Patient Instructions: 1)  Return to office in 3 months, sooner if needed  2)  Call [phone] for your results w/in next 3 days - if there's something important  I feel you need to know,  I'll be in touch with you directly.     Flu Vaccine Consent Questions     Do you have a  history of severe allergic reactions to this vaccine? no    Any prior history of allergic reactions to egg and/or gelatin? no    Do you have a sensitivity to the preservative Thimersol? no    Do you have a past history of Guillan-Barre Syndrome? no    Do you currently have an acute febrile illness? no    Have you ever had a severe reaction to latex? no    Vaccine information given and explained to patient? yes    Are you currently pregnant? no    Lot Number:[id number]   Exp Date:08/25/2010   Site Given  Right Deltoid IMflu  Elray Buba RN  February 08, 2010 10:21 AM

## 2010-05-08 NOTE — Letter (Signed)
Summary: [facility]   Imported By: Sherian Rein 05/01/2010 10:58:45  _____________________________________________________________________  External Attachment:    Type:   Image     Comment:   External Document

## 2010-06-11 LAB — TSH: TSH: 2.81 u[IU]/mL (ref 0.350–4.500)

## 2010-06-11 LAB — HEMOGLOBIN AND HEMATOCRIT, BLOOD
HCT: 30.2 % — ABNORMAL LOW (ref 39.0–52.0)
Hemoglobin: 10 g/dL — ABNORMAL LOW (ref 13.0–17.0)
Hemoglobin: 10.3 g/dL — ABNORMAL LOW (ref 13.0–17.0)
Hemoglobin: 10.4 g/dL — ABNORMAL LOW (ref 13.0–17.0)

## 2010-06-11 LAB — COMPREHENSIVE METABOLIC PANEL
Albumin: 3.4 g/dL — ABNORMAL LOW (ref 3.5–5.2)
BUN: 20 mg/dL (ref 6–23)
Calcium: 8.5 mg/dL (ref 8.4–10.5)
Creatinine, Ser: 1.33 mg/dL (ref 0.4–1.5)
GFR calc Af Amer: >60 mL/min (ref >60)
Total Bilirubin: 1 mg/dL (ref 0.3–1.2)
Total Protein: 6.4 g/dL (ref 6.0–8.3)

## 2010-06-11 LAB — CK TOTAL AND CKMB (NOT AT ARMC)
CK, MB: 8.5 ng/mL — ABNORMAL HIGH (ref 0.3–4.0)
Relative Index: 1.9 (ref 0.0–2.5)
Total CK: 445 U/L — ABNORMAL HIGH (ref 7–232)

## 2010-06-11 LAB — DIFFERENTIAL
Eosinophils Relative: 1 % (ref 0–5)
Lymphocytes Relative: 22 % (ref 12–46)
Monocytes Absolute: 0.8 10*3/uL (ref 0.1–1.0)
Monocytes Relative: 9 % (ref 3–12)
Neutro Abs: 5.5 10*3/uL (ref 1.7–7.7)

## 2010-06-11 LAB — BASIC METABOLIC PANEL
CO2: 30 mEq/L (ref 19–32)
Calcium: 8.9 mg/dL (ref 8.4–10.5)
GFR calc Af Amer: 54 mL/min — ABNORMAL LOW (ref >60)
Sodium: 139 mEq/L (ref 135–145)

## 2010-06-11 LAB — CROSSMATCH

## 2010-06-11 LAB — APTT: aPTT: 37 seconds (ref 24–37)

## 2010-06-11 LAB — CBC
HCT: 34.4 % — ABNORMAL LOW (ref 39.0–52.0)
Hemoglobin: 11.5 g/dL — ABNORMAL LOW (ref 13.0–17.0)
RBC: 3.64 MIL/uL — ABNORMAL LOW (ref 4.22–5.81)

## 2010-06-11 LAB — TROPONIN I: Troponin I: 0.02 ng/mL (ref 0.00–0.06)

## 2010-06-11 LAB — ABO/RH: ABO/RH(D): O POS

## 2010-06-11 LAB — PSA: PSA: 0.11 ng/mL (ref 0.10–4.00)

## 2010-06-11 LAB — HEMOCCULT GUIAC POC 1CARD (OFFICE): Fecal Occult Bld: POSITIVE

## 2010-07-10 NOTE — Discharge Summary (Signed)
[name], [name]               ACCOUNT NO.:  [id number]   MEDICAL RECORD NO.:  [id number]          PATIENT TYPE:  INP   LOCATION:  1438                         FACILITY[facility]  [facility]   PHYSICIAN:  Casimiro Needle B. [name], MD, FCCPDATE OF BIRTH:  Dec 12, 1926   DATE OF ADMISSION:  03/15/2008  DATE OF DISCHARGE:  03/17/2008                               DISCHARGE SUMMARY   DISCHARGE DIAGNOSIS:  Consists of rectal bleed.   HPI:  [name] is a 77 year old African American male with known  hypertension, chronic renal insufficiency, prostate cancer, coronary  artery disease, diverticulosis who presented with nonpainful bleeding of  the rectum.  He had been in his usual state of health without further  complaints.  He was admitted for further evaluation and treatment.   LAB DATA:  Hemoglobin initially 11.5, 10.4, 10.0, 10.3 on day of  discharge, hematocrit 30.2, WBC is 8.3, sodium 142, potassium 3.3,  chloride 108, CO2 of 29, BUN is 20, creatinine 1.33, glucose 100, PTT is  37, INR is 1.1, AST is 28, ALT is 19, alkaline phosphatase 47, total  bilirubin 1.0, albumin is 3.4, troponin I is 0.02, CK 445, CK-MB is 8.5,  calcium is 8.5, B-type natriuretic peptide is 243, sed rate is noted to  be 44, PSA is noted to be 0.11, TSH 2.810.  Occult blood in stool  positive.  Abdominal film demonstrates cardiomegaly, marked tortuous  aorta, benign abdominal films.   HOSPITAL COURSE BY DISCHARGE DIAGNOSIS:  Rectal bleed.  [name] was  admitted to [facility] and underwent GI evaluation by [facility].  No further bleeding was noted.  Hemoglobin  and hematocrit remained stable.  He is anemic.  This is being monitored.  There are no other health problems, no prostate cancer, chronic renal  insufficiency, hypertension, coronary artery disease, and right iliac  aneurysm remained unchanged.  He will be discharged home in improved  condition to follow up with Dr. Sandrea Hughs on  March 28, 2008, with a  CBC 3 days before to accurately follow his hemoglobin.   DISCHARGE MEDICATIONS:  1. Note his aspirin has been discontinued.  2. Clonidine 0.2 mg 1/2 in the a.m. and 1 at bedtime.  3. Furosemide 1 every a.m. and 1/2 in the p.m.  4. Atenolol 100 mg 1/2 daily.  5. Minoxidil 10 mg 1 every morning and 1/2 every night.  6. Protegra capsules 1 daily.  7. Centrum Silver 1 daily.  8. Simvastatin 20 mg 1 daily.   He has got a followup appointment with Dr. Sandrea Hughs on March 28, 2008, at 9:40 a.m.  He will have a CBC drawn 3 days prior.  He is on a  heart-healthy diet.  He is to call for any bright red bleeding from any  site.  He is being discharged in improved condition.      [name], MSN, ACNP      [name]. [name], MD, [facility]  Electronically Signed    SM/MEDQ  D:  03/17/2008  T:  03/17/2008  Job:  [id number]

## 2010-07-10 NOTE — Assessment & Plan Note (Signed)
[facility]                             PULMONARY OFFICE NOTE   ARROW, TOMKO                      MRN:          [id number]  DATE:01/23/2007                            DOB:          January 13, 1927    HISTORY:  A 77 year old, black male with difficult to control  hypertension complicated by mild chronic renal insufficiency and  tendency to chronic leg swelling.  He has had difficulty controlling his  weight, but has not had any further weight gain since his previous  visit.  He admits he is physically active, but not doing regular  exercise for the purpose of weight loss and tends to eat too much good  church food.  However, he denies any TIA or claudication symptoms,  orthopnea, PND or leg swelling, chest pains, fevers, chills or sweats.   PAST MEDICAL HISTORY:  1. Hypertension, difficult to control since 1968.  2. Hyperlipidemia with target LDL of less than 100 based on male      gender and longstanding hypertension along with previous aneurysms.  3. Chronic renal insufficiency with creatinine baseline of around 1.5.  4. Olecranon bursitis followed by Dr. Annell Greening.  5. Obesity with target weight less than 176.  6. Diverticulosis, status post colonoscopy on February 28, 2006.  7. Prostate cancer diagnosed in 2006, by Dr. Bjorn Pippin and followed      by Dr. Bjorn Pippin.   ALLERGIES:  No known drug allergies.   MEDICATIONS:  Taken in detail on the worksheet.  Correct in the column  dated January 23, 2007, as listed on the face sheet.   SOCIAL HISTORY:  He has never smoked and denies any alcohol use.  He is  busy with church activities, but does not do any regular aerobic  exercise.   FAMILY HISTORY:  Positive for hypertension and pancreatic cancer in his  brother.  Breast cancer in his sister.  Father died of a stroke.  No  heart disease or colon cancer in his family to his knowledge, although  his mother may have had a heart attack or stroke  at the age of 75, he is  just not sure.   REVIEW OF SYSTEMS:  Taken in detail on the worksheet.  Negative except  as outlined above.   PHYSICAL EXAMINATION:  GENERAL:  This is a robust, pleasant, healthy-  appearing, ambulatory, black male who appears actually younger than  stated age.  VITAL SIGNS:  Blood pressure 118/82, pulse rate 55.  HEENT:  Ocular exam was limited revealing no obvious retinal arterial  change.  Oropharynx was clear.  Dentition was intact.  Ear canals clear  bilaterally.  NECK:  Supple without cervical adenopathy or tenderness.  Trachea is  midline.  LUNGS:  Completely clear to auscultation bilaterally.  CARDIAC:  Regular rate and rhythm without murmurs, rubs or gallops.  ABDOMEN:  Soft, benign with a nickel sized umbilical hernia.  There were  no bruits.  Femoral pulses were present bilaterally.  GENITALIA/RECTAL:  Per Dr. Bjorn Pippin.  EXTREMITIES:  Warm without calf tenderness, cyanosis, clubbing.  Pedal  pulses  reduced symmetric bilaterally.  There was trace edema.  NEUROLOGIC:  No focal deficits.  SKIN:  Warm and dry.   LABORATORY DATA AND X-RAY FINDINGS:  Hematocrit of 34.9%.  Creatinine  1.6.  LDL cholesterol is 134 with an HDL of 37.  TSH was normal.  BNP  151.  CRP 1.  Urinalysis unremarkable.   EKG showed nonspecific T wave changes along with classic LVH change.   IMPRESSION:  1. Hypertension under adequate control complicated by chronic renal      insufficiency with no change in creatinine over the last year and      no evidence of congestive heart failure or excess fluid retention      from minoxidil.  I have therefore asked him to continue it.  2. Hyperlipidemia.  His LDL was 142 last year, 134 this year and with      a target of less than 100.  I will discuss specific options      including statins with him when he returns.  3. History of prostate cancer diagnosed in 2006, by Dr. Bjorn Pippin      with no significant obstructive symptoms.   Followup by Dr. Bjorn Pippin.  4. Health maintenance.  He was updated on tetanus in 2003, Pneumovax      in 2004, and received a flu vaccination given today.  5. Diverticulosis, status post most recent colonoscopy dated February 28, 2006, reviewed with the patient in detail today.  6. Asymptomatic umbilical hernia.  I have advised the patient      regarding options for management and the need to contact us for any      abdominal pain or obstructive symptoms.   FOLLOW UP:  Return every 3 months, sooner if needed.     [name]. [name], MD, [facility]  Electronically Signed    MBW/MedQ  DD: 01/26/2007  DT: 01/27/2007  Job #: [id number]
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
## 2010-07-13 NOTE — Assessment & Plan Note (Signed)
[facility]                             PULMONARY OFFICE NOTE   [name], [name]                      MRN:          [id number]  DATE:07/02/2006                            DOB:          07/20/1926    PRIMARY SERVICE/EXTENDED FOLLOWUP OFFICE VISIT   HISTORY OF PRESENT ILLNESS:  This is a 77 year old black male with  severe difficult hypertension complicated by previous right iliac artery  aneurysm and chronic renal insufficiency with a creatinine of 1.5.  He  is on complex medical regimen that he seems to be following very well by  the medical calendar that he brought with him today which displays his  medications in a very user friendly and unambiguous fashion.  He denies  excessive leg swelling and exertional chest pain, orthopnea, PND and  denies TIA or claudication symptoms and says he is passing his water  just fine.   MEDICATIONS:  For full inventory of medications, please see column dated  Jul 02, 2006.   PHYSICAL EXAMINATION:  GENERAL:  He is a pleasant, ambulatory black male  in no acute distress.  VITAL SIGNS:  Blood pressure of 110/78, pulse rate 68.  HEENT:  Unremarkable.  Pharynx is clear.  NECK:  Clear bilaterally.  LUNGS:  Lung fields clear bilaterally to auscultation and percussion.  HEART:  Regular rhythm without murmurs, gallops, rubs.  ABDOMEN:  Soft, benign without palpable organomegaly, masses or  tenderness.  There is no aortic enlargement of the bruits.  EXTREMITIES:  Warm without calf tenderness, cyanosis, clubbing or edema.   IMPRESSION:  1. This patient finally has good control of his hypertension with no      excessive leg swelling, orthostatic symptoms on his present      regimen.  I did review each and every one of his medications from      his sheet to make sure he and I were on the same wavelength.  He      seems to be using it very effectively to self administer a complex      medical regimen in a consistent  fashion.  2. Chronic renal insufficiency.  The record indicates that his      baseline creatinine is around 1.5 with his most recent study at 1.4      in February 2008.  Will recheck, therefore, a B-MET today.   I went over an extensive list of continuous list of contingencies for  the patient.  Today I actually spent 20 minutes of a 25 minute visit  making sure he understood both his medications and what to do under  certain circumstances (for leg swelling or orthostatic complaints etc.   Unless he is having new symptoms or worsening renal function, we can see  him back here for comprehensive evaluation in November 2008.     [name]. [name], MD, [facility]  Electronically Signed    MBW/MedQ  DD: 07/02/2006  DT: 07/02/2006  Job #: [id number]

## 2010-07-13 NOTE — Assessment & Plan Note (Signed)
[facility]                             PULMONARY OFFICE NOTE   MEHKI, KLUMPP                      MRN:          [id number]  DATE:04/15/2006                            DOB:          May 25, 1926    This is a primary service followup office visit.   HISTORY:  [age]-old black male with history of severe  difficult-to-control hypertension complicated by chronic renal  insufficiency with a creatinine typically that runs between 1.5 and 1.9.  He did not take his furosemide today, because he was coming to the  doctor but states he took all of his other morning medicines with no  significant history of any exertional chest pain, orthopnea, PND or  increased leg swelling over baseline.   MEDICATIONS:  For a full inventory of medications, please see the face  sheet column dated April 15, 2006.   PHYSICAL EXAMINATION:  He is a pleasant ambulatory black male in no  acute distress.  He is afebrile, stable vital signs except for a blood pressure of 160/90  and pulse rate 67.  HEENT:  Unremarkable, oropharynx is clear.  LUNGS:  Lung fields are completely clear bilaterally to auscultation and  percussion.  CARDIAC:  There is a regular rhythm without murmur, gallop or rub.  ABDOMEN:  Soft, benign.  EXTREMITIES:  Warm without calf tenderness, cyanosis or clubbing, there  was trace edema.   BMET is pending.   IMPRESSION:  Severe, difficult-to-control hypertension complicated by  chronic renal insufficiency and mild fluid retention.  If his creatinine  is adequate and by self-monitoring his blood pressures are maintained  below 160/90 I believe it is reasonable to continue on the same regimen.  Otherwise we may need to increase either the diuretic or the morning  clonidine back to higher levels depending on his tolerance to the higher  doses of clonidine (previously he had trouble with dry mouth and mild  sedation from the clonidine, but  now having been on it for such a long  time he may tolerate it better).  Followup either way will be every 3  months with continued self-monitoring of blood pressure in the meantime.     [name]. [name], MD, [facility]  Electronically Signed    MBW/MedQ  DD: 04/15/2006  DT: 04/16/2006  Job #: [id number]

## 2010-07-13 NOTE — Op Note (Signed)
NAMECOLE, EASTRIDGE               ACCOUNT NO.:  [id number]   MEDICAL RECORD NO.:  [id number]          PATIENT TYPE:  AMB   LOCATION:  DAY                          FACILITY:  [facility]   PHYSICIAN:  Ronald L. Earlene Plater, M.D.  DATE OF BIRTH:  [birth year]   DATE OF PROCEDURE:  08/17/2004  DATE OF DISCHARGE:                                 OPERATIVE REPORT   DIAGNOSIS:  Adenocarcinoma of the prostate.   OPERATIVE PROCEDURE:  Cryosurgical ablation of the prostate,  cystourethroscopy, placement of a suprapubic tube.   SURGEON:  Lucrezia Starch. Earlene Plater, M.D.   ANESTHESIA:  General endotracheal.   ESTIMATED BLOOD LOSS:  15 cc.   TUBES:  A [address] [place] Fader-tip suprapubic tube.   COMPLICATIONS:  None.   INDICATIONS FOR PROCEDURE:  Mr. Mardella Layman is a very nice 77 year old black  male who was initially found to have an elevated PSA of 5.1 and 13.5% free  to total ratio.  He underwent a transurethral ultrasound and biopsy of the  prostate and was found to have a Gleason score 8 adenocarcinoma, which was  noted to be present in 10% of the tissue from the left side of the prostate.  He had high grade TIN from the right side of the prostate.  The prostate was  essentially palpably normal, so he was stage T1C, Gleason score 8, 4+4.  He  has considered all options carefully.  Metastatic workup was negative.  After understanding the risks, benefits and alternatives, it was elected to  proceed with cryosurgical ablation of the prostate.   PROCEDURE IN DETAIL:  The patient was placed in a supine position.  After  proper general endotracheal anesthesia, was placed in the dorsal lithotomy  position, prepped and draped with Betadine in a sterile fashion.  The  cystourethroscopy was performed with a flexible Olympus cystourethroscope.  He was noted to have moderate trilobar hypertrophy and grade I  trabeculation.  The bladder was noted to efflux with clear urine, which was  noted from the normally  placed ureteral orifices bilaterally.  Under direct  vision, a suprapubic tube was placed with a [address] [place] [employer]  Fader-tip suprapubic tube and secured in position with a Cope mechanism and  sutured in place with silk suture.  A cystoscope was removed, and the BNK  biplanar ultrasound probe was placed.  On a setting of 7.1 MHz, the prostate  was localized and sized.  Needles were then placed, as was designated in the  record.  Four iced rods were used.  After proper placement of these rods, a  6 French Foley catheter was then placed to more readily visualize the  urethra.  Thermistors were placed in Denonvilliers fascia midline and in the  external sphincter, again visualized on axial and saggital scan.  A total of  two probes were in row 1, two probes were in row 2.  Row 3, 4, and 5 were  single probes posteriorly.  Following this, the Foley catheter was removed.  Cystourethroscopy was performed.  The external sphincter probe could be seen  tweaking the muscle, but  there was no perforation noted.  An Amplatz super-  stiff wire was placed into position to place the warming catheter.  The  flexible cystourethroscope was removed, and under sagittal scanning  visualization, the urethral warming catheter was placed over the Amplatz  wire and cycled with warm water.  Next, cryosurgical ablation was performed,  as is designated in the chart.  The ice ball appeared to form very well and  encompassed the entire prostate, avoiding the external sphincter thermistor  and muscle and avoiding Denonvilliers fascia.  These remained well above 15  degrees Centigrade.  An active thaw was then perform with helium.  The  freeze had been with argon gas.  After a full active thaw, a second freeze-  thaw cycle was performed in a similar manner.  Following the second active  thaw, all needles were removed.  The ultrasound probe was removed, and the  wound was dressed sterilely.  The suprapubic tube was  turned to drainage.  The urethral warmer was left a total of 20 minutes.  The patient was taken  to the recovery room stable.       RLD/MEDQ  D:  08/17/2004  T:  08/17/2004  Job:  [id number]

## 2010-07-13 NOTE — Assessment & Plan Note (Signed)
[facility]                             PULMONARY OFFICE NOTE   [name], STAIRS                      MRN:          [id number]  DATE:01/21/2006                            DOB:          [birth year]    COMPREHENSIVE EVALUATION:   HISTORY:  This is a 77 year old black male, never smoker, with severe  hypertension complicated by chronic renal insufficiency with creatinine  ranging from 1.5 to 2.0 and previous renal artery aneurysmectomy by Dr.  Tawanna Cooler Early in 1999.  The patient is active with church work but denies  any regular aerobic exercise.  She denies any exertional chest pain,  however, or any orthopnea, PND, or leg swelling, fever, chills, sweats.   PAST MEDICAL HISTORY:  1. Hypertension that has been difficult to control since 1968.  2. Hyperlipidemia with target LDL less than 100 based on male gender      and longstanding hypertension with previous aneurysm and,      therefore, presumably peripheral vascular disease although      asymptomatic to date.  3. Chronic renal insufficiency with creatinine 1.5-2.0.  4. Olecranon bursitis followed by Dr. Annell Greening, and the patient      understands not to use nonsteroidals.  5. Obesity with target weight of 176 with weight of 207 a year ago and      no improvement toward goal to date.  6. Diverticulosis status post colonoscopy October 2002, in the      computer for recall this year and has received a letter and plans      to make the phone call.  7. Prostate cancer diagnosed in 2006 by Dr. Bjorn Pippin following      cryotherapy.   ALLERGIES:  None known.   MEDICATIONS:  Taken in detail on the worksheet, correct as listed in  column dated January 21, 2006.   SOCIAL HISTORY:  She has never smoked, denies any alcohol use.  She is  busy with church activities but does not do any regular exercise.   FAMILY HISTORY:  Positive for hypertension, pancreatic cancer in  brother, breast cancer in a  sister.  Father died of a stroke.  No heart  disease or colon cancer in family to his knowledge.  His mother may have  had a heart attack or stroke at the age of 51, but he is not sure.   REVIEW OF SYSTEMS:  Taken in detail on the worksheet.  Significant for  problems outlined above.  He does have nocturia x2.   PHYSICAL EXAMINATION:  GENERAL:  This is a very pleasant, ambulatory  black male in no acute distress.  VITAL SIGNS: After taking his blood pressure medicines this morning  except for furosemide, his blood pressure is 126/74, and his weight is  211 which is up another 4 pounds from  year ago.  HEENT:  Limited eye exam revealed no obvious retinal arterial change.  Oropharynx clear.  Dentition intact. Ear canals revealed minimal  retained wax.  NECK:  Supple without cervical adenopathy or tenderness. Carotid  upstrokes brisk without any bruits.  CHEST:  Lung fields were completely clear bilaterally to auscultation.  CARDIOVASCULAR:  Regular rate and rhythm without murmurs, rubs, or  gallops present. Slight displacement of PMI to the left.  ABDOMEN: Soft, benign.  No organomegaly, mass, or tenderness. No  aneurysm appreciated.  EXTREMITIES: Femoral pulses were present bilaterally with no bruits.  Extremities warm without calf tenderness, cyanosis, clubbing, or edema.  Pedal pulses present bilaterally, although reduced in symmetric fashion.  NEUROLOGIC: No focal deficits or pathological reflexes.  He had  excellent long-term and normal gait.  SKIN: Warm and dry with no lesions.  MUSCULOSKELETAL: Exam revealed no evidence of significant crepitations  of shoulders, knees, or hips and full range of motion of hips.   Chest x-ray showed cardiomegaly with no infiltrates or effusions.   EKG revealed sinus rhythm with borderline LDH criteria.   Creatinine 1.9, up from 1.6 on his previous visit.  TSH was normal.  BNP  was 168.  CRP was 11.  Urinalysis revealed trace protein. CBC was   normal.  LDL cholesterol was 142 with an HDL of 38.   IMPRESSION:  1. Hypertension is relatively well controlled with no evidence of      significant congestive heart failure or fluid retention form      minoxidil.  2. Chronic renal insufficiency, creatinine toward the upper end of his      normal range at 1.9.  He has again been advised about following the      medications on his calendar and avoiding nonsteroidals and any      other medication that is not on the list.  3. Hyperlipidemia.  His LDL was 121 last year and improving towards      goal, but now he is slipping in the other direction associated with      weight gain. I will discuss this with him over the phone tree and      give him the option of either starting medication, being much more      careful with diet, or accepting a higher risk of atherosclerotic      events.  4. History of benign prostatic hypertrophy, now with prostate cancer      diagnosed in 2006 by Dr. Bjorn Pippin with no obstructive symptoms.  5. Health maintenance.  He was updated in tetanus in 2003, Pneumovax      in 2004, and received the flu vaccination last month.  6. Diverticulosis.  The patient is in the computer for recall and has      received a letter but needs      to make the phone call to schedule repeat colonoscopy if not now,      after the first of the year      and between now and his next visit.  7. Followup will be every 3 months, sooner if needed.     [name]. Dillon Sires, MD, [facility]  Electronically Signed    MBW/MedQ  DD: 01/22/2006  DT: 01/22/2006  Job #: [id number]

## 2010-07-13 NOTE — Assessment & Plan Note (Signed)
[facility]                               PULMONARY OFFICE NOTE   OLNEY, MONIER                      MRN:          [id number]  DATE:12/20/2005                            DOB:          [birth year]    HISTORY:  Mr. Delay is a 77 year old black male with severe hypertension  complicated by renal insufficiency on high dose combination therapy which he  keeps track of in a form of a medication counter.  He has been doing very  well in this regard.  He denies any orthopnea, PND or leg swelling, fever,  chills, shortness of breath, chest pain, TIA or claudication symptoms.   PHYSICAL EXAMINATION:  GENERAL:  He is a pleasant, mature black man in no  acute distress.  VITAL SIGNS:  Blood pressure 122/78, pulse 56.  HEENT:  Unremarkable.  Oropharynx is clear.  CHEST:  Lung fields clear bilaterally to auscultation and percussion.  HEART:  Regular rhythm without murmur, gallop or rub.  ABDOMEN:  Soft, benign.  EXTREMITIES:  Warm without calf tenderness, cyanosis or clubbing or edema.   IMPRESSION:  Hypertension, well controlled on his present regimen with mild  bradycardia from atenolol.  May well be possible to reduce the atenolol  further since clonidine is also serving to control blood pressure from an  inotropic/chromotropic perspective.   The patient is actually due a comprehensive healthcare evaluation.  We are  going to go ahead and obtain the blood work today including the renal  profile and have him return  to complete the comprehensive evaluation within  the next four weeks.  At that time I will consider reducing the atenolol  down if not off.    ______________________________  [name]. [name], MD, [facility]    MBW/MedQ  DD: 12/20/2005  DT: 12/21/2005  Job #: [id number]

## 2010-07-20 ENCOUNTER — Other Ambulatory Visit: Payer: Self-pay | Admitting: Internal Medicine

## 2010-07-24 ENCOUNTER — Telehealth: Payer: Self-pay | Admitting: Internal Medicine

## 2010-07-24 NOTE — Telephone Encounter (Signed)
Spoke with pt and explained that he was overdue for appt, and this is why we only gave 1 month supply. Pt verbalized understanding. I sched him rov with MW for 08/16/10 at 3:15 and advised that he should keep this for future refills and he verbalized understanding.

## 2010-08-04 ENCOUNTER — Other Ambulatory Visit: Payer: Self-pay | Admitting: Internal Medicine

## 2010-08-11 ENCOUNTER — Encounter: Payer: Self-pay | Admitting: Internal Medicine

## 2010-08-16 ENCOUNTER — Ambulatory Visit (INDEPENDENT_AMBULATORY_CARE_PROVIDER_SITE_OTHER): Payer: Medicare Other | Admitting: Internal Medicine

## 2010-08-16 ENCOUNTER — Other Ambulatory Visit (INDEPENDENT_AMBULATORY_CARE_PROVIDER_SITE_OTHER): Payer: Medicare Other

## 2010-08-16 ENCOUNTER — Encounter: Payer: Self-pay | Admitting: Internal Medicine

## 2010-08-16 DIAGNOSIS — M109 Gout, unspecified: Secondary | ICD-10-CM

## 2010-08-16 DIAGNOSIS — D649 Anemia, unspecified: Secondary | ICD-10-CM

## 2010-08-16 DIAGNOSIS — N19 Unspecified kidney failure: Secondary | ICD-10-CM

## 2010-08-16 DIAGNOSIS — R609 Edema, unspecified: Secondary | ICD-10-CM

## 2010-08-16 DIAGNOSIS — C61 Malignant neoplasm of prostate: Secondary | ICD-10-CM

## 2010-08-16 DIAGNOSIS — I10 Essential (primary) hypertension: Secondary | ICD-10-CM

## 2010-08-16 LAB — CBC WITH DIFFERENTIAL/PLATELET
Eosinophils Relative: 1.4 % (ref 0.0–5.0)
HCT: 33.4 % — ABNORMAL LOW (ref 39.0–52.0)
Hemoglobin: 11.5 g/dL — ABNORMAL LOW (ref 13.0–17.0)
Lymphs Abs: 1.3 10*3/uL (ref 0.7–4.0)
Monocytes Relative: 12.3 % — ABNORMAL HIGH (ref 3.0–12.0)
Neutro Abs: 5.4 10*3/uL (ref 1.4–7.7)
WBC: 7.9 10*3/uL (ref 4.5–10.5)

## 2010-08-16 LAB — BASIC METABOLIC PANEL
CO2: 32 mEq/L (ref 19–32)
Calcium: 8.8 mg/dL (ref 8.4–10.5)
Creatinine, Ser: 1.6 mg/dL — ABNORMAL HIGH (ref 0.4–1.5)
Glucose, Bld: 78 mg/dL (ref 70–99)

## 2010-08-16 LAB — HEPATIC FUNCTION PANEL
Albumin: 4.3 g/dL (ref 3.5–5.2)
Alkaline Phosphatase: 58 U/L (ref 39–117)

## 2010-08-16 NOTE — Assessment & Plan Note (Signed)
Ok control on rx

## 2010-08-16 NOTE — Progress Notes (Signed)
Subjective:     Patient ID: [name], male   DOB: 25-Apr-1926, 77 y.o.   MRN: [id number]  HPI  20 yobm with difficult to control hypertension complicated by mild chronic renal insufficiency and tendency to chronic leg swelling.  Developed Gout summer of 2009, saw gout doctor ? given colchicine.   March 29, 2008 after hosp discharge for gib on asa, did not recur once asa stopped, GI svc saw pt and rec conservative rx for presumed diverticular bleed. No abd pain/wt loss. rec  maintain off asa   August 22, 2009--Returns for follow up and med review. He is doing well since last visit. No major gout flares since last visit.  Had venous dopplers last visit which were neg. Labs showed no sign. change in renal insufficiency w/ sCr at 1.7. Mild anemia, hgb sl down at 11.0. We reviewed meds today and updated his med calendar.   February 08, 2010 cpx no new complaints, occ tylenol no nsaids needed. Leg swelling L > R no change so no change in rx  08/16/2010 ov/ Senaya Dicenso f/u HBP/ CRI cc intermittent dep swelling L > R just a little worse than baseline on feet a lot and not wearing support hose. occ gout flares controlled with prn allopurinol.     Pt denies any significant sore throat, dysphagia, itching, sneezing,  nasal congestion or excess/ purulent secretions,  fever, chills, sweats, unintended wt loss, pleuritic or exertional cp, hempoptysis, orthopnea pnd     Also denies any obvious fluctuation of symptoms with weather or environmental changes or other aggravating or alleviating factors.     :  Past Medical History:  Diverticulosis..............................................................Marland KitchenPatterson  - Colonoscopy 02/28/06  - LGIB 03/15/2008 while on asa, dc'd  Gout  - Uric acid 10 01/2009 > allopurinol started March 21, 2009 > recheck 08/16/2010  Left leg swelling onset 05/2009  - Venous dopplers   Jul 11, 2009 >>>neg  Hyperlipidemia  - Target LDL < 70 (hbp, PVD)  Myocardial infarction, hx  of  OLECRANON BURSITIIS........................................................Marland KitchenYates  Renal failure baseline creat 1.5  Hypertension  PROSTATE CA......................................................................Marland KitchenWrenn  - Cryotherapy 02/2004  ANEMIA  -08/22/09 hbg 11.0, iron studies> 11% sat HEALTH MAINTENANCE..........................................................Marland KitchenWert  - DT 10/2001  - Pneumovax 10/2002 - CPX February 08, 2010  Complex Med regimen  -Meds calendar adjust-February 15, 2008 , August 22, 2009    Family History:  HBP brother  Pancreatic ca brother  CVA father and ? mother    Social History:  Never smoker  No ETOH  Retired, stays busy with church activities, no aerobics  Single     Review of Systems     Objective:   Physical Exam  Ambulatory healthy appearing elderly bm in no acute distress.  wt 206 March 29, 2008 > 201 March 21, 2009 >  206 08/16/2010  HEENT: nl dentition, turbinates, and orophanx. EAC clear with coarse ear hair- no wax seen TMs normal  Neck without JVD/Nodes/TM  Lungs clear to A and P bilaterally without cough on insp or exp maneuvers  RRR no s3 or murmur, , bilateral mod L, mild pitting R  Abd soft and benign with nl excursion in the supine position. No bruits or organomegaly  Ext warm without calf tenderness, cyanosis clubbing. Neg Homan's  MS Kyphotic spine, moderate, o/w no obvious joint deformity, restriction, nl gait    Assessment:         Plan:

## 2010-08-16 NOTE — Assessment & Plan Note (Addendum)
No change baseline   Cautioned re over use of diuretics and avoidance of nsaids

## 2010-08-16 NOTE — Assessment & Plan Note (Signed)
Minimally worse, improves when elevated no assoc pain so rx conservatively with elevation/ support hose.

## 2010-08-16 NOTE — Patient Instructions (Signed)
Wear tighter knee high support stockings to prevent fluid retention and elevated  them as much as possible  Please schedule a follow up visit in 3 months but call sooner if needed

## 2010-08-16 NOTE — Assessment & Plan Note (Signed)
No change hgb

## 2010-08-16 NOTE — Assessment & Plan Note (Signed)
Uric Acid 7.5 to 7.8 despite allopurinol, still having occ flares,  will increase to 300 mg daily allopurinol

## 2010-08-17 ENCOUNTER — Telehealth: Payer: Self-pay | Admitting: Internal Medicine

## 2010-08-17 MED ORDER — CLONIDINE HCL 0.2 MG PO TABS: ORAL_TABLET | ORAL | Status: DC

## 2010-08-17 MED ORDER — ALLOPURINOL 300 MG PO TABS: 300.0000 mg | ORAL_TABLET | Freq: Every day | ORAL | Status: DC

## 2010-08-17 NOTE — Telephone Encounter (Signed)
Spoke with pt. Gave him his lab results and refilled meds per his request.

## 2010-11-06 ENCOUNTER — Encounter: Payer: Self-pay | Admitting: Internal Medicine

## 2010-11-06 ENCOUNTER — Ambulatory Visit (INDEPENDENT_AMBULATORY_CARE_PROVIDER_SITE_OTHER): Payer: Medicare Other | Admitting: Internal Medicine

## 2010-11-06 DIAGNOSIS — M109 Gout, unspecified: Secondary | ICD-10-CM

## 2010-11-06 DIAGNOSIS — I10 Essential (primary) hypertension: Secondary | ICD-10-CM

## 2010-11-06 DIAGNOSIS — R609 Edema, unspecified: Secondary | ICD-10-CM

## 2010-11-06 DIAGNOSIS — Z23 Encounter for immunization: Secondary | ICD-10-CM

## 2010-11-06 DIAGNOSIS — N19 Unspecified kidney failure: Secondary | ICD-10-CM

## 2010-11-06 NOTE — Assessment & Plan Note (Signed)
Adequate control on present rx, reviewed  

## 2010-11-06 NOTE — Patient Instructions (Signed)
See calendar for specific medication instructions and bring it back for each and every office visit for every healthcare provider you see.  Without it,  you may not receive the best quality medical care that we feel you deserve.  You will note that the calendar groups together  your maintenance  medications that are timed at particular times of the day.  Think of this as your checklist for what your doctor has instructed you to do until your next evaluation to see what benefit  there is  to staying on a consistent group of medications intended to keep you well.  The other group at the bottom is entirely up to you to use as you see fit  for specific symptoms that may arise between visits that require you to treat them on an as needed basis.  Think of this as your action plan or "what if" list.   Separating the top medications from the bottom group is fundamental to providing you adequate care going forward.    CPX due after Feb 09 2011

## 2010-11-06 NOTE — Assessment & Plan Note (Signed)
Lab Results  Component Value Date   CREATININE 1.6* 08/16/2010   CREATININE 1.6* 02/08/2010   CREATININE 1.6* 10/16/2009    Adequate control on present rx, reviewed > recheck at cpx 02/09/11

## 2010-11-06 NOTE — Progress Notes (Signed)
Subjective:     Patient ID: [name], male   DOB: [birth year], 77 y.o.   MRN: [id number]  HPI  39 yobm with difficult to control hypertension complicated by mild chronic renal insufficiency and tendency to chronic leg swelling L >> R - Developed Gout summer of 2009, saw gout doctor >  Colchicine rx.  March 29, 2008 after hosp discharge for gib on asa, did not recur once asa stopped, GI svc saw pt and rec conservative rx for presumed diverticular bleed. No abd pain/wt loss. rec  maintain off asa   August 22, 2009--Returns for follow up and med review. He is doing well since last visit. No major gout flares since last visit.  Had venous dopplers last visit which were neg. Labs showed no sign. change in renal insufficiency w/ sCr at 1.7. Mild anemia, hgb sl down at 11.0. We reviewed meds today and updated his med calendar.rec no change in rx   February 08, 2010 cpx no new complaints, occ tylenol no nsaids needed. Leg swelling L > R no change so  rec no change in rx  08/16/2010 ov/ [name] f/u HBP/ CRI cc intermittent dep swelling L > R just a little worse than baseline on feet a lot and not wearing support hose. occ gout flares controlled with prn colchicine rec Wear tighter knee high support stockings to prevent fluid retention and elevated  them as much as possible    11/06/2010 f/u ov/[name] cc swelling better, not using support hose much. No tia or claudication. Swelling is always better with L leg elevated.     Pt denies any significant sore throat, dysphagia, itching, sneezing,  nasal congestion or excess/ purulent secretions,  fever, chills, sweats, unintended wt loss, pleuritic or exertional cp, hempoptysis, orthopnea pnd     Also denies any obvious fluctuation of symptoms with weather or environmental changes or other aggravating or alleviating factors.     :  Past Medical History:  Diverticulosis..............................................................Marland KitchenPatterson  - Colonoscopy  02/28/06  - LGIB 03/15/2008 while on asa, dc'd  Gout  - Uric acid 10 01/2009 > allopurinol started March 21, 2009 > recheck 08/16/2010  Left leg swelling onset 05/2009  - Venous dopplers   Jul 11, 2009 >>>neg  Hyperlipidemia  - Target LDL < 70 (hbp, PVD)  Myocardial infarction, hx of  OLECRANON BURSITIIS........................................................Marland KitchenYates  Renal failure baseline creat 1.5  Hypertension  PROSTATE CA......................................................................Marland KitchenWrenn  - Cryotherapy 02/2004  ANEMIA  -08/22/09 hbg 11.0, iron studies> 11% sat HEALTH MAINTENANCE..........................................................Marland KitchenWert  - DT 10/2001  - Pneumovax 10/2002 - CPX February 08, 2010  Complex Med regimen  -Meds calendar adjust-February 15, 2008 , August 22, 2009    Family History:  HBP brother  Pancreatic ca brother  CVA father and ? mother    Social History:  Never smoker  No ETOH  Retired, stays busy with church activities, no aerobics  Single           Objective:   Physical Exam  Ambulatory healthy appearing elderly bm in no acute distress.  wt 206 March 29, 2008 > 201 March 21, 2009 >  206 08/16/2010 > 11/06/2010  202 HEENT: nl dentition, turbinates, and orophanx. EAC clear with coarse ear hair- no wax seen TMs normal  Neck without JVD/Nodes/TM  Lungs clear to A and P bilaterally without cough on insp or exp maneuvers  RRR no s3 or murmur, , bilateral mod L, trace  pitting R  Abd soft and benign with nl excursion in  the supine position. No bruits or organomegaly  Ext warm without calf tenderness, cyanosis clubbing. Neg Homan's  MS Kyphotic spine, moderate, o/w no obvious joint deformity, restriction, nl gait    Assessment:         Plan:

## 2010-11-06 NOTE — Assessment & Plan Note (Signed)
Reviewed strategy of elevating legs when supine and wearing support hose when upright with lasix dosing as well    Each maintenance medication was reviewed in detail including most importantly the difference between maintenance and as needed and under what circumstances the prns are to be used.    This was done in the context of a medication calendar review which provided the patient with a user-friendly unambiguous mechanism for medication administration and reconciliation and provides an action plan for all active problems. It is critical that this be shown to every doctor  for modification during the office visit if necessary so the patient can use it as a working document. Marland Kitchen

## 2010-11-27 ENCOUNTER — Other Ambulatory Visit: Payer: Self-pay | Admitting: Internal Medicine

## 2010-12-07 ENCOUNTER — Other Ambulatory Visit: Payer: Self-pay | Admitting: Internal Medicine

## 2011-02-14 ENCOUNTER — Other Ambulatory Visit: Payer: Self-pay | Admitting: Internal Medicine

## 2011-02-15 ENCOUNTER — Ambulatory Visit (INDEPENDENT_AMBULATORY_CARE_PROVIDER_SITE_OTHER)
Admission: RE | Admit: 2011-02-15 | Discharge: 2011-02-15 | Disposition: A | Discharge status: Home / Self Care | Payer: Medicare Other | Source: Ambulatory Visit | Attending: Internal Medicine | Admitting: Internal Medicine

## 2011-02-15 ENCOUNTER — Encounter: Payer: Self-pay | Admitting: Internal Medicine

## 2011-02-15 ENCOUNTER — Ambulatory Visit (INDEPENDENT_AMBULATORY_CARE_PROVIDER_SITE_OTHER): Payer: Medicare Other | Admitting: Internal Medicine

## 2011-02-15 ENCOUNTER — Other Ambulatory Visit (INDEPENDENT_AMBULATORY_CARE_PROVIDER_SITE_OTHER): Payer: Medicare Other

## 2011-02-15 VITALS — BP 160/98 | HR 57 | Temp 97.9°F | Ht 68.0 in | Wt 202.2 lb

## 2011-02-15 DIAGNOSIS — N19 Unspecified kidney failure: Secondary | ICD-10-CM

## 2011-02-15 DIAGNOSIS — D649 Anemia, unspecified: Secondary | ICD-10-CM

## 2011-02-15 DIAGNOSIS — M109 Gout, unspecified: Secondary | ICD-10-CM

## 2011-02-15 DIAGNOSIS — I10 Essential (primary) hypertension: Secondary | ICD-10-CM

## 2011-02-15 DIAGNOSIS — E785 Hyperlipidemia, unspecified: Secondary | ICD-10-CM

## 2011-02-15 LAB — LIPID PANEL
HDL: 51.8 mg/dL (ref >39.00)
Total CHOL/HDL Ratio: 3
VLDL: 8 mg/dL (ref 0.0–40.0)

## 2011-02-15 LAB — URINALYSIS
Nitrite: NEGATIVE
Total Protein, Urine: NEGATIVE
pH: 6.5 (ref 5.0–8.0)

## 2011-02-15 LAB — CBC WITH DIFFERENTIAL/PLATELET
Eosinophils Relative: 1.1 % (ref 0.0–5.0)
Lymphocytes Relative: 12.8 % (ref 12.0–46.0)
Monocytes Absolute: 0.8 10*3/uL (ref 0.1–1.0)
Monocytes Relative: 10 % (ref 3.0–12.0)
Neutrophils Relative %: 75.8 % (ref 43.0–77.0)
Platelets: 150 10*3/uL (ref 150.0–400.0)
WBC: 7.6 10*3/uL (ref 4.5–10.5)

## 2011-02-15 LAB — BASIC METABOLIC PANEL
GFR: 49.12 mL/min — ABNORMAL LOW (ref >60.00)
Glucose, Bld: 100 mg/dL — ABNORMAL HIGH (ref 70–99)
Potassium: 3.9 mEq/L (ref 3.5–5.1)
Sodium: 143 mEq/L (ref 135–145)

## 2011-02-15 LAB — HEPATIC FUNCTION PANEL
AST: 47 U/L — ABNORMAL HIGH (ref 0–37)
Alkaline Phosphatase: 67 U/L (ref 39–117)
Total Bilirubin: 0.7 mg/dL (ref 0.3–1.2)

## 2011-02-15 MED ORDER — MINOXIDIL 10 MG PO TABS: 10.0000 mg | ORAL_TABLET | Freq: Every day | ORAL | Status: DC

## 2011-02-15 NOTE — Progress Notes (Signed)
Subjective:    Patient ID: [name], male    DOB: 08-May-1926, 77 y.o.   MRN: [id number]  HPI  41 yobm with difficult to control hypertension complicated by mild chronic renal insufficiency and tendency to chronic leg swelling L >> R - Developed Gout summer of 2009, saw gout doctor > Colchicine rx.   March 29, 2008 after hosp discharge for gib on asa, did not recur once asa stopped, GI svc saw pt and rec conservative rx for presumed diverticular bleed. No abd pain/wt loss.  rec maintain off asa   August 22, 2009--Returns for follow up and med review. He is doing well since last visit. No major gout flares since last visit.  Had venous dopplers last visit which were neg. Labs showed no sign. change in renal insufficiency w/ sCr at 1.7. Mild anemia, hgb sl down at 11.0. We reviewed meds today and updated his med calendar rec no change in rx   02/15/2011 f/u ov/[name] cc cpx, no new complaints, no change in chronic L > R leg swelling  Pt denies any significant sore throat, dysphagia, itching, sneezing, nasal congestion or excess/ purulent secretions, fever, chills, sweats, unintended wt loss, pleuritic or exertional cp, hempoptysis, orthopnea pnd  Also denies any obvious fluctuation of symptoms with weather or environmental changes or other aggravating or alleviating factors.     Past Medical History:  Diverticulosis..............................................................Marland KitchenPatterson  - Colonoscopy 02/28/06  - LGIB 03/15/2008 while on asa, dc'd  Gout  - Uric acid 10 01/2009 > allopurinol started March 21, 2009 > recheck 08/16/2010  7.8  Left leg swelling onset 05/2009  - Venous dopplers Jul 11, 2009 >>>neg  Hyperlipidemia  - Target LDL < 70 (hbp, PVD)   OLECRANON BURSITIIS........................................................Marland KitchenYates  Renal failure baseline creat 1.5  Hypertension  PROSTATE CA......................................................................Marland KitchenWrenn  -  Cryotherapy 02/2004  ANEMIA  -08/22/09 hbg 11.0, iron studies> 11% sat  HEALTH MAINTENANCE..........................................................Marland KitchenWert  - DT 10/2001  - Pneumovax 10/2002  - CPX 02/15/2011  Complex Med regimen  -Meds calendar adjust-February 15, 2008 , August 22, 2009    Family History:  HBP brother  Pancreatic ca brother  CVA father and ? mother   Social History:  Never smoker  No ETOH  Retired, stays busy with church activities, no aerobics  Single    Review of Systems  Constitutional: Negative for fever, chills, diaphoresis, activity change, appetite change, fatigue and unexpected weight change.  HENT: Negative for hearing loss, ear pain, nosebleeds, congestion, sore throat, facial swelling, rhinorrhea, sneezing, mouth sores, trouble swallowing, neck pain, neck stiffness, dental problem, voice change, postnasal drip, sinus pressure, tinnitus and ear discharge.   Eyes: Negative for photophobia, discharge, itching and visual disturbance.  Respiratory: Negative for apnea, cough, choking, chest tightness, shortness of breath, wheezing and stridor.   Cardiovascular: Negative for chest pain, palpitations and leg swelling.  Gastrointestinal: Negative for nausea, vomiting, abdominal pain, constipation, blood in stool and abdominal distention.  Genitourinary: Negative for dysuria, urgency, frequency, hematuria, flank pain, decreased urine volume and difficulty urinating.  Musculoskeletal: Negative for myalgias, back pain, joint swelling, arthralgias and gait problem.  Skin: Negative for color change, pallor and rash.  Neurological: Negative for dizziness, tremors, seizures, syncope, speech difficulty, weakness, light-headedness, numbness and headaches.  Hematological: Negative for adenopathy. Does not bruise/bleed easily.  Psychiatric/Behavioral: Negative for confusion, sleep disturbance and agitation. The patient is not nervous/anxious.        Objective:   Physical  Exam Ambulatory healthy appearing elderly bm in no acute  distress.  wt 206 March 29, 2008 > 201 March 21, 2009 > 203 Jul 11, 2009 > 204 October 16, 2009 > 205 February 08, 2010 > 02/15/2011 202  HEENT: nl dentition, turbinates, and orophanx. EAC clear with coarse ear hair- no wax seen TMs normal  Neck without JVD/Nodes/TM  Lungs clear to A and P bilaterally without cough on insp or exp maneuvers  RRR no s3 or murmur, , bilateral mod L, mild pitting R  Abd soft and benign with nl excursion in the supine position. No bruits or organomegaly  Ext warm without calf tenderness, cyanosis clubbing. Neg Homan's  MS Kyphotic spine, moderate, o/w no obvious joint deformity, restriction, nl gait  Neuro alert and approp no motor or cerebellar deficits, nl recall  GU per Dr Annabell Howells   CXR  02/15/2011 :  Stable moderate cardiomegaly. No active lung disease.       Assessment & Plan:

## 2011-02-15 NOTE — Patient Instructions (Signed)
Please remember to go to the lab and x-ray department downstairs for your tests - we will call you with the results when they are available.    Increase your minoxidil so you take 10 mg one twice daily  See calendar for specific medication instructions and bring it back for each and every office visit for every healthcare provider you see.  Without it,  you may not receive the best quality medical care that we feel you deserve.  You will note that the calendar groups together  your maintenance  medications that are timed at particular times of the day.  Think of this as your checklist for what your doctor has instructed you to do until your next evaluation to see what benefit  there is  to staying on a consistent group of medications intended to keep you well.  The other group at the bottom is entirely up to you to use as you see fit  for specific symptoms that may arise between visits that require you to treat them on an as needed basis.  Think of this as your action plan or "what if" list.   Separating the top medications from the bottom group is fundamental to providing you adequate care going forward.    Please schedule a follow up office visit in 6 weeks, call sooner if needed

## 2011-02-17 NOTE — Assessment & Plan Note (Signed)
Lab Results  Component Value Date   CREATININE 1.7* 02/15/2011   CREATININE 1.6* 08/16/2010   CREATININE 1.6* 02/08/2010   no changes needed

## 2011-02-17 NOTE — Assessment & Plan Note (Signed)
Adequate control on present rx, reviewed  

## 2011-02-17 NOTE — Assessment & Plan Note (Signed)
Almost completely resolved, no furhter w/u needed

## 2011-02-17 NOTE — Assessment & Plan Note (Signed)
Not Adequate control on present rx, reviewed options >  Increase minoxidil to 10 mg bid

## 2011-02-18 ENCOUNTER — Telehealth: Payer: Self-pay | Admitting: Internal Medicine

## 2011-02-18 MED ORDER — MINOXIDIL 10 MG PO TABS: 10.0000 mg | ORAL_TABLET | Freq: Two times a day (BID) | ORAL | Status: DC

## 2011-02-18 NOTE — Telephone Encounter (Signed)
Pt called back and did receive his results of labs and cxr per MW.  Pt voiced his understanding of the results.  Pt stated that MW wanted pt to take the minoxidil 10mg   1 po bid but the rx that was sent in was for 1 daily.  Pt is requesting recs from MW on this.  Please advise. thanks

## 2011-02-18 NOTE — Telephone Encounter (Signed)
Should be 10 mg bid

## 2011-02-18 NOTE — Telephone Encounter (Signed)
Pt is aware Minoxidil is twice a day and a new Rx has been called to [facility] with the corrected directions and quantity.

## 2011-02-18 NOTE — Telephone Encounter (Signed)
All labs are negative and cxr looks okay per MW. LMOMTCB  X 1.

## 2011-03-05 ENCOUNTER — Other Ambulatory Visit: Payer: Self-pay | Admitting: Internal Medicine

## 2011-03-07 ENCOUNTER — Telehealth: Payer: Self-pay | Admitting: Internal Medicine

## 2011-03-07 MED ORDER — FUROSEMIDE 80 MG PO TABS: ORAL_TABLET | ORAL | Status: DC

## 2011-03-07 NOTE — Telephone Encounter (Signed)
RX has been sent and pt is aware 

## 2011-04-01 ENCOUNTER — Ambulatory Visit (INDEPENDENT_AMBULATORY_CARE_PROVIDER_SITE_OTHER): Payer: Medicare Other | Admitting: Internal Medicine

## 2011-04-01 ENCOUNTER — Encounter: Payer: Self-pay | Admitting: Internal Medicine

## 2011-04-01 ENCOUNTER — Other Ambulatory Visit (INDEPENDENT_AMBULATORY_CARE_PROVIDER_SITE_OTHER): Payer: Medicare Other

## 2011-04-01 VITALS — BP 138/78 | HR 67 | Temp 98.3°F | Ht 70.0 in | Wt 207.4 lb

## 2011-04-01 DIAGNOSIS — R609 Edema, unspecified: Secondary | ICD-10-CM

## 2011-04-01 DIAGNOSIS — I10 Essential (primary) hypertension: Secondary | ICD-10-CM

## 2011-04-01 DIAGNOSIS — N19 Unspecified kidney failure: Secondary | ICD-10-CM

## 2011-04-01 LAB — BASIC METABOLIC PANEL
BUN: 35 mg/dL — ABNORMAL HIGH (ref 6–23)
CO2: 32 mEq/L (ref 19–32)
Chloride: 102 mEq/L (ref 96–112)
Creatinine, Ser: 1.8 mg/dL — ABNORMAL HIGH (ref 0.4–1.5)
Potassium: 3.5 mEq/L (ref 3.5–5.1)

## 2011-04-01 NOTE — Patient Instructions (Signed)
Wear the knee high stockings as much as possible  See calendar for specific medication instructions and bring it back for each and every office visit for every healthcare provider you see.  Without it,  you may not receive the best quality medical care that we feel you deserve.  You will note that the calendar groups together  your maintenance  medications that are timed at particular times of the day.  Think of this as your checklist for what your doctor has instructed you to do until your next evaluation to see what benefit  there is  to staying on a consistent group of medications intended to keep you well.  The other group at the bottom is entirely up to you to use as you see fit  for specific symptoms that may arise between visits that require you to treat them on an as needed basis.  Think of this as your action plan or "what if" list.   Separating the top medications from the bottom group is fundamental to providing you adequate care going forward.    Please remember to go to the lab department downstairs for your tests - we will call you with the results when they are available.  Please schedule a follow up visit in 3 months but call sooner if needed to see Tammy for new med calendar with all active medications in hand

## 2011-04-01 NOTE — Assessment & Plan Note (Signed)
- -   Venous dopplers   Jul 11, 2009 > neg   No change exam L > R swelling.  Needs to remember to use knee highs

## 2011-04-01 NOTE — Assessment & Plan Note (Signed)
Improved control on present rx, reviewed, with no tendency to increased leg swelling on minoxidil.    Each maintenance medication was reviewed in detail including most importantly the difference between maintenance and as needed and under what circumstances the prns are to be used. This was done in the context of a medication calendar review which provided the patient with a user-friendly unambiguous mechanism for medication administration and reconciliation and provides an action plan for all active problems. It is critical that this be shown to every doctor  for modification during the office visit if necessary so the patient can use it as a working document.

## 2011-04-01 NOTE — Progress Notes (Signed)
Quick Note:  Spoke with pt and notified of results per Dr. Wert. Pt verbalized understanding and denied any questions.  ______ 

## 2011-04-01 NOTE — Progress Notes (Signed)
Subjective:    Patient ID: [name], male    DOB: [birth year]  78 y.o.   MRN: [id number]  HPI  12 yobm with difficult to control hypertension complicated by mild chronic renal insufficiency and tendency to chronic leg swelling L >> R - Developed Gout summer of 2009, saw gout doctor > Colchicine rx.   March 29, 2008 after hosp discharge for gib on asa, did not recur once asa stopped, GI svc saw pt and rec conservative rx for presumed diverticular bleed. No abd pain/wt loss.  rec maintain off asa   August 22, 2009--Returns for follow up and med review. He is doing well since last visit. No major gout flares since last visit.  Had venous dopplers last visit which were neg. Labs showed no sign. change in renal insufficiency w/ sCr at 1.7. Mild anemia, hgb sl down at 11.0. We reviewed meds today and updated his med calendar rec no change in rx   02/15/2011 f/u ov/Alanis Clift cc cpx, no new complaints, no change in chronic L > R leg swelling rec Increase your minoxidil so you take 10 mg one twice daily     04/01/2011 f/u ov/[facility] for f/u hbp/cri/leg swelling  cc no change chronic L > R leg swelling, no cough or sob, tia or claudication  Sleeping ok without nocturnal  or early am exacerbation  of respiratory  c/o's. Also denies any obvious fluctuation of symptoms with weather or environmental changes or other aggravating or alleviating factors except as outlined above .   ROS  At present neg for  any significant sore throat, dysphagia, itching, sneezing,  nasal congestion or excess/ purulent secretions,  fever, chills, sweats, unintended wt loss, pleuritic or exertional cp, hempoptysis, orthopnea pnd or leg swelling.  Also denies presyncope, palpitations, heartburn, abdominal pain, nausea, vomiting, diarrhea  or change in bowel or urinary habits, dysuria,hematuria,  rash, arthralgias, visual complaints, headache, numbness weakness or ataxia.       Past Medical History:    Diverticulosis..............................................................Marland KitchenPatterson  - Colonoscopy 02/28/06  - LGIB 03/15/2008 while on asa, dc'd  Gout  - Uric acid 10 01/2009 > allopurinol started March 21, 2009 > recheck 08/16/2010  7.8  Left leg swelling onset 05/2009  - Venous dopplers Jul 11, 2009 >>>neg  Hyperlipidemia  - Target LDL < 70 (hbp, PVD)   OLECRANON BURSITIIS........................................................Marland KitchenYates  Renal failure baseline creat 1.5  Hypertension  PROSTATE CA......................................................................Marland KitchenWrenn  - Cryotherapy 02/2004  ANEMIA  -08/22/09 hbg 11.0, iron studies> 11% sat  HEALTH MAINTENANCE..........................................................Marland KitchenWert  - DT 10/2001  - Pneumovax 10/2002 (age 20 last shot needed) - CPX 02/15/2011  Complex Med regimen  -Meds calendar adjust-February 15, 2008 , August 22, 2009    Family History:  HBP brother  Pancreatic ca brother  CVA father and ? mother   Social History:  Never smoker  No ETOH  Retired, stays busy with church activities, no aerobics  Single            Objective:   Physical Exam Ambulatory healthy appearing elderly bm in no acute distress.  wt 206 March 29, 2008 > 201 March 21, 2009 > 203 Jul 11, 2009 > 204 October 16, 2009 > 205 February 08, 2010 > 02/15/2011 202  > 04/01/2011  207  HEENT: nl dentition, turbinates, and orophanx. EAC clear with coarse ear hair- no wax seen TMs normal  Neck without JVD/Nodes/TM  Lungs clear to A and P bilaterally without cough on insp or exp maneuvers  RRR no  s3 or murmur, , bilateral mod L, mild pitting R  Abd soft and benign with nl excursion in the supine position. No bruits or organomegaly  Ext warm without calf tenderness, cyanosis clubbing. Neg Homan's  MS Kyphotic spine, moderate, o/w no obvious joint deformity, restriction,  Mod stooped over gait  GU per Dr Annabell Howells   CXR  02/15/2011 :  Stable moderate  cardiomegaly. No active lung disease.       Assessment & Plan:

## 2011-04-01 NOTE — Assessment & Plan Note (Signed)
Lab Results  Component Value Date   CREATININE 1.8* 04/01/2011   CREATININE 1.7* 02/15/2011   CREATININE 1.6* 08/16/2010     No need to change rx , bp much better

## 2011-07-01 ENCOUNTER — Ambulatory Visit (INDEPENDENT_AMBULATORY_CARE_PROVIDER_SITE_OTHER): Payer: Medicare Other | Admitting: Adult Health

## 2011-07-01 ENCOUNTER — Encounter: Payer: Self-pay | Admitting: Adult Health

## 2011-07-01 ENCOUNTER — Other Ambulatory Visit (INDEPENDENT_AMBULATORY_CARE_PROVIDER_SITE_OTHER): Payer: Medicare Other

## 2011-07-01 VITALS — BP 144/84 | HR 64 | Temp 97.7°F | Ht 70.0 in | Wt 209.3 lb

## 2011-07-01 DIAGNOSIS — D649 Anemia, unspecified: Secondary | ICD-10-CM

## 2011-07-01 DIAGNOSIS — I10 Essential (primary) hypertension: Secondary | ICD-10-CM

## 2011-07-01 DIAGNOSIS — R0989 Other specified symptoms and signs involving the circulatory and respiratory systems: Secondary | ICD-10-CM

## 2011-07-01 DIAGNOSIS — R29898 Other symptoms and signs involving the musculoskeletal system: Secondary | ICD-10-CM | POA: Insufficient documentation

## 2011-07-01 LAB — BASIC METABOLIC PANEL
BUN: 34 mg/dL — ABNORMAL HIGH (ref 6–23)
Calcium: 8.9 mg/dL (ref 8.4–10.5)
Creatinine, Ser: 1.8 mg/dL — ABNORMAL HIGH (ref 0.4–1.5)
GFR: 45.68 mL/min — ABNORMAL LOW (ref >60.00)
Glucose, Bld: 78 mg/dL (ref 70–99)
Potassium: 3.7 mEq/L (ref 3.5–5.1)

## 2011-07-01 LAB — CBC WITH DIFFERENTIAL/PLATELET
Basophils Relative: 0.5 % (ref 0.0–3.0)
Eosinophils Absolute: 0.1 10*3/uL (ref 0.0–0.7)
Eosinophils Relative: 1.6 % (ref 0.0–5.0)
HCT: 33.1 % — ABNORMAL LOW (ref 39.0–52.0)
Lymphs Abs: 0.7 10*3/uL (ref 0.7–4.0)
MCHC: 32.9 g/dL (ref 30.0–36.0)
MCV: 96.8 fl (ref 78.0–100.0)
Monocytes Absolute: 0.8 10*3/uL (ref 0.1–1.0)
Neutrophils Relative %: 79.2 % — ABNORMAL HIGH (ref 43.0–77.0)
RBC: 3.42 Mil/uL — ABNORMAL LOW (ref 4.22–5.81)
WBC: 7.9 10*3/uL (ref 4.5–10.5)

## 2011-07-01 LAB — IBC PANEL: Iron: 62 ug/dL (ref 42–165)

## 2011-07-01 NOTE — Patient Instructions (Signed)
Follow med calendar closely and bring to each visit.  We are setting you up for a doppler of your neck to evaluate your carotid arteries.  I will call with results and lab work .  follow up Dr. [name]  In 3 months and As needed

## 2011-07-01 NOTE — Assessment & Plan Note (Signed)
Recheck labs today with iron panel

## 2011-07-01 NOTE — Assessment & Plan Note (Signed)
Set up for carotid dopplers

## 2011-07-01 NOTE — Progress Notes (Signed)
Subjective:    Patient ID: [name], male    DOB: 1926/06/29  78 y.o.   MRN: [id number]  HPI 26 yobm with difficult to control hypertension complicated by mild chronic renal insufficiency and tendency to chronic leg swelling L >> R - Developed Gout summer of 2009, saw gout doctor > [facility] rx.   March 29, 2008 after hosp discharge for gib on asa, did not recur once asa stopped, GI svc saw pt and rec conservative rx for presumed diverticular bleed. No abd pain/wt loss.  rec maintain off asa   August 22, 2009--Returns for follow up and med review. He is doing well since last visit. No major gout flares since last visit.  Had venous dopplers last visit which were neg. Labs showed no sign. change in renal insufficiency w/ sCr at 1.7. Mild anemia, hgb sl down at 11.0. We reviewed meds today and updated his med calendar rec no change in rx   02/15/2011 f/u ov/Wert cc cpx, no new complaints, no change in chronic L > R leg swelling rec Increase your minoxidil so you take 10 mg one twice daily     04/01/2011 f/u ov/Wert for f/u hbp/cri/leg swelling  cc no change chronic L > R leg swelling, no cough or sob, tia or claudication >No changes   07/01/2011 Follow up  Patient returns for a followup visit and medication review. Patient reports he has been doing well without any no complaints. He denies any chest pain, orthopnea, PND, increased leg swelling, syncope. Patient is very active in volunteer work and his church. He denies any gout flares and remains on allopurinol daily. We reviewed all his medications and organized them into his medication calendar. It appears that he is taking his medications correctly.      Past Medical History:  Diverticulosis..............................................................Marland KitchenPatterson  - Colonoscopy 02/28/06  - LGIB 03/15/2008 while on asa, dc'd  Gout  - Uric acid 10 01/2009 > allopurinol started March 21, 2009 > recheck 08/16/2010  7.8  Left leg  swelling onset 05/2009  - Venous dopplers Jul 11, 2009 >>>neg  Hyperlipidemia  - Target LDL < 70 (hbp, PVD)   OLECRANON BURSITIIS........................................................Marland KitchenYates  Renal failure baseline creat 1.5  Hypertension  PROSTATE CA......................................................................Marland KitchenWrenn  - Cryotherapy 02/2004  ANEMIA  -08/22/09 hbg 11.0, iron studies> 11% sat  HEALTH MAINTENANCE..........................................................Marland KitchenWert  - DT 10/2001  - Pneumovax 10/2002 (age 44 last shot needed) - CPX 02/15/2011  Complex Med regimen  -Meds calendar adjust-February 15, 2008 , August 22, 2009 , 07/01/2011    Family History:  HBP brother  Pancreatic ca brother  CVA father and ? mother   Social History:  Never smoker  No ETOH  Retired, stays busy with church activities, no aerobics  Single            Objective:   Physical Exam Ambulatory healthy appearing elderly bm in no acute distress.  wt 206 March 29, 2008 > 201 March 21, 2009 > 203 Jul 11, 2009 > 204 October 16, 2009 > 205 February 08, 2010 > 02/15/2011 202  > 04/01/2011  207 >209 07/01/2011  HEENT: nl dentition, turbinates, and orophanx. EAC clear with coarse ear hair- no wax seen TMs normal  Neck without JVD/Nodes/TM ,+right carotid bruit , 2+pulses  Lungs clear to A and P bilaterally without cough on insp or exp maneuvers  RRR grade 1-2 SM ,  bilateral mod L, mild pitting R  Abd soft and benign with nl excursion in the supine position.  No bruits or organomegaly  Ext warm without calf tenderness, cyanosis clubbing. Neg Homan's  MS Kyphotic spine, moderate, arthritic changes of the hands. w no obvious joint deformity, restriction,  Mod stooped over gait    CXR  02/15/2011 :  Stable moderate cardiomegaly. No active lung disease.       Assessment & Plan:

## 2011-07-01 NOTE — Assessment & Plan Note (Signed)
Compensated on present regimen.   

## 2011-07-01 NOTE — Assessment & Plan Note (Signed)
Follow up bmet

## 2011-07-04 ENCOUNTER — Encounter (INDEPENDENT_AMBULATORY_CARE_PROVIDER_SITE_OTHER): Payer: Medicare Other

## 2011-07-04 DIAGNOSIS — R0989 Other specified symptoms and signs involving the circulatory and respiratory systems: Secondary | ICD-10-CM

## 2011-07-09 ENCOUNTER — Encounter: Payer: Self-pay | Admitting: Adult Health

## 2011-07-10 ENCOUNTER — Telehealth: Payer: Self-pay | Admitting: Internal Medicine

## 2011-07-10 NOTE — Telephone Encounter (Signed)
Called spoke with patient and advised of negative carotid doppler results done on 5.9.13.  Pt verbalized his understanding and will call if anything further is needed.  Will sign off.

## 2011-08-09 ENCOUNTER — Other Ambulatory Visit: Payer: Self-pay | Admitting: Internal Medicine

## 2011-08-22 ENCOUNTER — Other Ambulatory Visit: Payer: Self-pay | Admitting: Internal Medicine

## 2011-09-13 ENCOUNTER — Other Ambulatory Visit: Payer: Self-pay | Admitting: Internal Medicine

## 2011-09-30 ENCOUNTER — Encounter: Payer: Self-pay | Admitting: Internal Medicine

## 2011-09-30 ENCOUNTER — Ambulatory Visit (INDEPENDENT_AMBULATORY_CARE_PROVIDER_SITE_OTHER): Payer: Medicare Other | Admitting: Internal Medicine

## 2011-09-30 ENCOUNTER — Other Ambulatory Visit (INDEPENDENT_AMBULATORY_CARE_PROVIDER_SITE_OTHER): Payer: Medicare Other

## 2011-09-30 VITALS — BP 128/62 | HR 55 | Temp 97.9°F | Ht 69.0 in | Wt 203.0 lb

## 2011-09-30 DIAGNOSIS — I10 Essential (primary) hypertension: Secondary | ICD-10-CM

## 2011-09-30 DIAGNOSIS — N19 Unspecified kidney failure: Secondary | ICD-10-CM

## 2011-09-30 DIAGNOSIS — R29898 Other symptoms and signs involving the musculoskeletal system: Secondary | ICD-10-CM

## 2011-09-30 DIAGNOSIS — D649 Anemia, unspecified: Secondary | ICD-10-CM

## 2011-09-30 DIAGNOSIS — K5730 Diverticulosis of large intestine without perforation or abscess without bleeding: Secondary | ICD-10-CM

## 2011-09-30 LAB — CBC WITH DIFFERENTIAL/PLATELET
Basophils Absolute: 0 10*3/uL (ref 0.0–0.1)
Eosinophils Absolute: 0.2 10*3/uL (ref 0.0–0.7)
Hemoglobin: 10.6 g/dL — ABNORMAL LOW (ref 13.0–17.0)
Lymphocytes Relative: 12.2 % (ref 12.0–46.0)
MCHC: 32.8 g/dL (ref 30.0–36.0)
Monocytes Relative: 11.1 % (ref 3.0–12.0)
Neutro Abs: 5.1 10*3/uL (ref 1.4–7.7)
Neutrophils Relative %: 73.9 % (ref 43.0–77.0)
Platelets: 132 10*3/uL — ABNORMAL LOW (ref 150.0–400.0)
RDW: 14.1 % (ref 11.5–14.6)

## 2011-09-30 LAB — BASIC METABOLIC PANEL
BUN: 36 mg/dL — ABNORMAL HIGH (ref 6–23)
CO2: 30 mEq/L (ref 19–32)
Calcium: 9 mg/dL (ref 8.4–10.5)
Creatinine, Ser: 1.7 mg/dL — ABNORMAL HIGH (ref 0.4–1.5)
GFR: 49.04 mL/min — ABNORMAL LOW (ref >60.00)
Glucose, Bld: 86 mg/dL (ref 70–99)
Sodium: 141 mEq/L (ref 135–145)

## 2011-09-30 NOTE — Patient Instructions (Addendum)
Please remember to go to the lab department downstairs for your tests - we will call you with the results when they are available.  Please see patient coordinator before you leave today  to schedule neurology evaluation for L leg weakness  Please schedule a follow up office visit in 6 weeks, call sooner if needed

## 2011-09-30 NOTE — Assessment & Plan Note (Signed)
-   Colonoscopy 02/28/06  - LGIB 03/15/2008 while on asa, dc'd    Significant risk therefore considering more aggressive rx for possible cva's

## 2011-09-30 NOTE — Assessment & Plan Note (Addendum)
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

## 2011-09-30 NOTE — Assessment & Plan Note (Signed)
Nothing specific on exam and pt tends to minimize but feel neurology eval needed to see if having any ischemic events noting h/o gib places him at risk of more aggressive anticoagulation at this point

## 2011-09-30 NOTE — Progress Notes (Signed)
Subjective:    Patient ID: [name], male    DOB: [birth year] .   MRN: [id number]  HPI 78 yobm with difficult to control hypertension complicated by mild chronic renal insufficiency and tendency to chronic leg swelling L >> R - Developed Gout summer of 2009, saw gout doctor > [facility] rx.   March 29, 2008 after hosp discharge for gib on asa, did not recur once asa stopped, GI svc saw pt and rec conservative rx for presumed diverticular bleed. No abd pain/wt loss.  rec maintain off asa   August 22, 2009--Returns for follow up and med review. He is doing well since last visit. No major gout flares since last visit.  Had venous dopplers last visit which were neg. Labs showed no sign. change in renal insufficiency w/ sCr at 1.7. Mild anemia, hgb sl down at 11.0. We reviewed meds today and updated his med calendar rec no change in rx   02/15/2011 f/u ov/Aalani Aikens cc cpx, no new complaints, no change in chronic L > R leg swelling rec Increase your minoxidil so you take 10 mg one twice daily     04/01/2011 f/u ov/Mikle Sternberg for f/u hbp/cri/leg swelling  cc no change chronic L > R leg swelling, no cough or sob, tia or claudication >No changes   07/01/2011 Follow up  Patient returns for a followup visit and medication review. Patient reports he has been doing well without any no complaints.   We reviewed all his medications and organized them into his medication calendar. It appears that he is taking his medications correctly. rec Follow med calendar closely and bring to each visit.  We are setting you up for a doppler of your neck to evaluate your carotid arteries> ok  09/30/2011 f/u ov/Darik Massing using calnendar well  (L handed ) cc leg weakness >  indolent onset x sev months ago, sometimes worse than others s pattern, no problmes with coordination or speech or HA, numbness, falling or L hand problems, viz complaints and doesn't really seem all that concerned about it.    Sleeping ok without nocturnal  or  early am exacerbation  of respiratory  c/o's or need for noct saba. Also denies any obvious fluctuation of symptoms with weather or environmental changes or other aggravating or alleviating factors except as outlined above   ROS  The following are not active complaints unless bolded sore throat, dysphagia, dental problems, itching, sneezing,  nasal congestion or excess/ purulent secretions, ear ache,   fever, chills, sweats, unintended wt loss, pleuritic or exertional cp, hemoptysis,  orthopnea pnd or leg swelling, presyncope, palpitations, heartburn, abdominal pain, anorexia, nausea, vomiting, diarrhea  or change in bowel or urinary habits, change in stools or urine, dysuria,hematuria,  rash, arthralgias, visual complaints, headache, numbness weakness or ataxia or problems with walking or coordination,  change in mood/affect or memory.          Past Medical History:  Diverticulosis..............................................................Marland KitchenPatterson  - Colonoscopy 02/28/06  - LGIB 03/15/2008 while on asa, dc'd  Gout  - Uric acid 10 01/2009 > allopurinol started March 21, 2009 > recheck 08/16/2010  7.8  Left leg swelling onset 05/2009  - Venous dopplers Jul 11, 2009 >>>neg  Hyperlipidemia  - Target LDL < 70 (hbp, PVD)   OLECRANON BURSITIIS........................................................Marland KitchenYates  Renal failure baseline creat 1.5  Hypertension  PROSTATE CA......................................................................Marland KitchenWrenn  - Cryotherapy 02/2004  ANEMIA  -08/22/09 hbg 11.0, iron studies> 11% sat  HEALTH MAINTENANCE..........................................................Marland KitchenWert  - DT 10/2001  - Pneumovax 10/2002 (age  78 last shot needed) - CPX 02/15/2011  Complex Med regimen  -Meds calendar adjust-February 15, 2008 , August 22, 2009 , 07/01/2011    Family History:  HBP brother  Pancreatic ca brother  CVA father and ? mother   Social History:  Never smoker  No ETOH    Retired, stays busy with church activities, no aerobics  Single            Objective:   Physical Exam Ambulatory healthy appearing elderly bm in no acute distress with somewhat of a shuffling stooped over gait wt 206 March 29, 2008 > 201 March 21, 2009 > 203 Jul 11, 2009 > 204 October 16, 2009 > 205 February 08, 2010 > 02/15/2011 202  > 04/01/2011  207 >209 07/01/2011 > 09/30/2011 203 HEENT: nl dentition, turbinates, and orophanx. EAC clear with coarse ear hair- no wax seen TMs normal  Neck without JVD/Nodes/TM ,+right carotid bruit , 2+pulses  Lungs clear to A and P bilaterally without cough on insp or exp maneuvers  RRR grade 1-2 SM ,  bilateral mod L, mild pitting R with tightly wrapped lesg bilaterlly Abd soft and benign with nl excursion in the supine position. No bruits or organomegaly  Ext warm without calf tenderness, cyanosis clubbing. Neg Homan's  MS Kyphotic spine, moderate, arthritic changes of the hands. w no obvious joint deformity, restriction,  Mod stooped over gait Neuro:  slt reduced FTN testing bilaterally = o/w nl exam with no def weakness in lower extremety flex/ ext at knees or ankles and no pathologic reflexes.  2 point discrimination also nl all 4 ext and viz field teststing intact    CXR  02/15/2011 : Stable moderate cardiomegaly. No active lung disease.       Assessment & Plan:

## 2011-09-30 NOTE — Assessment & Plan Note (Signed)
Adequate control on present rx, reviewed  

## 2011-09-30 NOTE — Progress Notes (Signed)
Quick Note:  Spoke with pt and notified of results per Dr. Wert. Pt verbalized understanding and denied any questions.  ______ 

## 2011-09-30 NOTE — Assessment & Plan Note (Signed)
-   LGIB 03/15/2008 while on asa, dc'd    Lab 09/30/11 1020  HGB 10.6*    Adequate control on present rx, reviewed

## 2011-10-21 ENCOUNTER — Other Ambulatory Visit: Payer: Self-pay | Admitting: Neurology

## 2011-10-21 DIAGNOSIS — R5381 Other malaise: Secondary | ICD-10-CM

## 2011-10-21 DIAGNOSIS — R269 Unspecified abnormalities of gait and mobility: Secondary | ICD-10-CM

## 2011-10-21 DIAGNOSIS — R6889 Other general symptoms and signs: Secondary | ICD-10-CM

## 2011-10-21 DIAGNOSIS — D518 Other vitamin B12 deficiency anemias: Secondary | ICD-10-CM

## 2011-10-21 DIAGNOSIS — R0989 Other specified symptoms and signs involving the circulatory and respiratory systems: Secondary | ICD-10-CM

## 2011-10-23 ENCOUNTER — Ambulatory Visit
Admission: RE | Admit: 2011-10-23 | Discharge: 2011-10-23 | Disposition: A | Discharge status: Home / Self Care | Payer: Medicare Other | Source: Ambulatory Visit | Attending: Neurology | Admitting: Neurology

## 2011-10-23 DIAGNOSIS — R5381 Other malaise: Secondary | ICD-10-CM

## 2011-10-23 DIAGNOSIS — R6889 Other general symptoms and signs: Secondary | ICD-10-CM

## 2011-10-23 DIAGNOSIS — D518 Other vitamin B12 deficiency anemias: Secondary | ICD-10-CM

## 2011-10-23 DIAGNOSIS — R269 Unspecified abnormalities of gait and mobility: Secondary | ICD-10-CM

## 2011-10-23 DIAGNOSIS — R0989 Other specified symptoms and signs involving the circulatory and respiratory systems: Secondary | ICD-10-CM

## 2011-11-04 ENCOUNTER — Other Ambulatory Visit: Payer: Self-pay | Admitting: Neurology

## 2011-11-04 DIAGNOSIS — R269 Unspecified abnormalities of gait and mobility: Secondary | ICD-10-CM

## 2011-11-04 DIAGNOSIS — G544 Lumbosacral root disorders, not elsewhere classified: Secondary | ICD-10-CM

## 2011-11-08 ENCOUNTER — Ambulatory Visit: Payer: Medicare Other | Attending: Neurology | Admitting: *Deleted

## 2011-11-08 ENCOUNTER — Ambulatory Visit
Admission: RE | Admit: 2011-11-08 | Discharge: 2011-11-08 | Disposition: A | Discharge status: Home / Self Care | Payer: Medicare Other | Source: Ambulatory Visit | Attending: Neurology | Admitting: Neurology

## 2011-11-08 DIAGNOSIS — R293 Abnormal posture: Secondary | ICD-10-CM | POA: Insufficient documentation

## 2011-11-08 DIAGNOSIS — R269 Unspecified abnormalities of gait and mobility: Secondary | ICD-10-CM

## 2011-11-08 DIAGNOSIS — G544 Lumbosacral root disorders, not elsewhere classified: Secondary | ICD-10-CM

## 2011-11-08 DIAGNOSIS — IMO0001 Reserved for inherently not codable concepts without codable children: Secondary | ICD-10-CM | POA: Insufficient documentation

## 2011-11-11 ENCOUNTER — Ambulatory Visit (INDEPENDENT_AMBULATORY_CARE_PROVIDER_SITE_OTHER): Payer: Medicare Other | Admitting: Internal Medicine

## 2011-11-11 ENCOUNTER — Encounter: Payer: Self-pay | Admitting: Internal Medicine

## 2011-11-11 VITALS — BP 132/80 | HR 66 | Temp 98.0°F | Ht 68.0 in | Wt 201.0 lb

## 2011-11-11 DIAGNOSIS — I10 Essential (primary) hypertension: Secondary | ICD-10-CM

## 2011-11-11 DIAGNOSIS — N19 Unspecified kidney failure: Secondary | ICD-10-CM

## 2011-11-11 DIAGNOSIS — Z23 Encounter for immunization: Secondary | ICD-10-CM

## 2011-11-11 NOTE — Patient Instructions (Addendum)
See calendar for specific medication instructions and bring it back for each and every office visit for every healthcare provider you see.  Without it,  you may not receive the best quality medical care that we feel you deserve.  You will note that the calendar groups together  your maintenance  medications that are timed at particular times of the day.  Think of this as your checklist for what your doctor has instructed you to do until your next evaluation to see what benefit  there is  to staying on a consistent group of medications intended to keep you well.  The other group at the bottom is entirely up to you to use as you see fit  for specific symptoms that may arise between visits that require you to treat them on an as needed basis.  Think of this as your action plan or "what if" list.   Separating the top medications from the bottom group is fundamental to providing you adequate care going forward.   Ok to take aspirin 81 mg enteric coated one daily with breakfast but stop at first sign of any bleeding   Please schedule a follow up visit in 3 months but call sooner if needed CPX on return after 12/21

## 2011-11-11 NOTE — Progress Notes (Signed)
Subjective:    Patient ID: [name], male    DOB: 04-30-1926 .   MRN: [id number]  HPI 70 yobm with difficult to control hypertension complicated by mild chronic renal insufficiency and tendency to chronic leg swelling L >> R - Developed Gout summer of 2009, saw gout doctor > [facility] rx.   March 29, 2008 after hosp discharge for gib on asa, did not recur once asa stopped, GI svc saw pt and rec conservative rx for presumed diverticular bleed. No abd pain/wt loss.  rec maintain off asa   August 22, 2009--Returns for follow up and med review. He is doing well since last visit. No major gout flares since last visit.  Had venous dopplers last visit which were neg. Labs showed no sign. change in renal insufficiency w/ sCr at 1.7. Mild anemia, hgb sl down at 11.0. We reviewed meds today and updated his med calendar rec no change in rx   02/15/2011 f/u ov/Wert cc cpx, no new complaints, no change in chronic L > R leg swelling rec Increase your minoxidil so you take 10 mg one twice daily     04/01/2011 f/u ov/Wert for f/u hbp/cri/leg swelling  cc no change chronic L > R leg swelling, no cough or sob, tia or claudication >No changes   07/01/2011 Follow up  Patient returns for a followup visit and medication review. Patient reports he has been doing well without any no complaints.   We reviewed all his medications and organized them into his medication calendar. It appears that he is taking his medications correctly. rec Follow med calendar closely and bring to each visit.  We are setting you up for a doppler of your neck to evaluate your carotid arteries> ok  09/30/2011 f/u ov/Wert using calendar well  (L handed ) cc leg weakness >  indolent onset x sev months ago, sometimes worse than others s pattern, no problmes with coordination or speech or HA, numbness, falling or L hand problems, viz complaints and doesn't really seem all that concerned about it.   rec Please remember to go to the lab  department downstairs for your tests - we will call you with the results when they are available. Please see patient coordinator before you leave today  to schedule neurology evaluation for L leg weakness      11/11/2011 f/u ov/Wert cc no change chronic leg swelling, no sob, no change mild ataxia, no falls. So far neruo w/u neg but mri spine pending.   Sleeping ok without nocturnal  or early am exacerbation  of respiratory  c/o's or need for noct saba. Also denies any obvious fluctuation of symptoms with weather or environmental changes or other aggravating or alleviating factors except as outlined above   ROS  The following are not active complaints unless bolded sore throat, dysphagia, dental problems, itching, sneezing,  nasal congestion or excess/ purulent secretions, ear ache,   fever, chills, sweats, unintended wt loss, pleuritic or exertional cp, hemoptysis,  orthopnea pnd or leg swelling, presyncope, palpitations, heartburn, abdominal pain, anorexia, nausea, vomiting, diarrhea  or change in bowel or urinary habits, change in stools or urine, dysuria,hematuria,  rash, arthralgias, visual complaints, headache, numbness weakness or ataxia or problems with walking or coordination,  change in mood/affect or memory.          Past Medical History:  Diverticulosis..............................................................Marland KitchenPatterson  - Colonoscopy 02/28/06  - LGIB 03/15/2008 while on asa, dc'd  Gout  - Uric acid 10 01/2009 > allopurinol  started March 21, 2009 > recheck 08/16/2010  7.8  Left leg swelling onset 05/2009  - Venous dopplers Jul 11, 2009 >>>neg  Hyperlipidemia  - Target LDL < 70 (hbp, PVD)   OLECRANON BURSITIIS........................................................Marland KitchenYates  Renal failure baseline creat 1.5  Hypertension  PROSTATE CA......................................................................Marland KitchenWrenn  - Cryotherapy 02/2004  ANEMIA  -08/22/09 hbg 11.0, iron studies> 11%  sat  HEALTH MAINTENANCE..........................................................Marland KitchenWert  - DT 10/2001  - Pneumovax 10/2002 (age 50 last shot needed) - CPX 02/15/2011  Complex Med regimen  -Meds calendar adjust-February 15, 2008 , August 22, 2009 , 07/01/2011    Family History:  HBP brother  Pancreatic ca brother  CVA father and ? mother   Social History:  Never smoker  No ETOH  Retired, stays busy with church activities, no aerobics  Single            Objective:   Physical Exam Ambulatory healthy appearing elderly bm in no acute distress with somewhat of a shuffling stooped over gait wt 206 March 29, 2008 > 201 March 21, 2009 > 203 Jul 11, 2009 > 204 October 16, 2009 > 205 February 08, 2010 > 02/15/2011 202  > 04/01/2011  207 >209 07/01/2011 > 09/30/2011 203 > 201 11/11/2011  HEENT: nl dentition, turbinates, and orophanx. EAC clear with coarse ear hair- no wax seen TMs normal  Neck without JVD/Nodes/TM ,+right carotid bruit , 2+pulses  Lungs clear to A and P bilaterally without cough on insp or exp maneuvers  RRR grade 1-2 SM ,  bilateral mod L, mild pitting R with tightly wrapped lesg bilaterlly Abd soft and benign with nl excursion in the supine position. No bruits or organomegaly  Ext warm without calf tenderness, cyanosis clubbing. Neg Homan's  MS Kyphotic spine, moderate, arthritic changes of the hands. w no obvious joint deformity, restriction,  Mod stooped over gait Neuro:  slt reduced FTN testing bilaterally = o/w nl exam with no def weakness in lower extremety flex/ ext at knees or ankles and no pathologic reflexes.  2 point discrimination also nl all 4 ext and viz field teststing intact    CXR  02/15/2011 : Stable moderate cardiomegaly. No active lung disease.       Assessment & Plan:

## 2011-11-11 NOTE — Assessment & Plan Note (Signed)
Lab Results  Component Value Date   CREATININE 1.7* 09/30/2011   CREATININE 1.8* 07/01/2011   CREATININE 1.8* 04/01/2011    Adequate control on present rx, reviewed > recheck 01/2012 in setting of cpx

## 2011-11-11 NOTE — Assessment & Plan Note (Signed)
Adequate control on present rx, reviewed  

## 2011-11-13 ENCOUNTER — Ambulatory Visit (HOSPITAL_COMMUNITY)
Admission: RE | Admit: 2011-11-13 | Discharge: 2011-11-13 | Disposition: A | Discharge status: Home / Self Care | Payer: Medicare Other | Source: Ambulatory Visit | Attending: Neurology | Admitting: Neurology

## 2011-11-13 DIAGNOSIS — R0989 Other specified symptoms and signs involving the circulatory and respiratory systems: Secondary | ICD-10-CM

## 2011-11-13 DIAGNOSIS — I6529 Occlusion and stenosis of unspecified carotid artery: Secondary | ICD-10-CM | POA: Insufficient documentation

## 2011-11-13 DIAGNOSIS — G8190 Hemiplegia, unspecified affecting unspecified side: Secondary | ICD-10-CM | POA: Insufficient documentation

## 2011-11-13 NOTE — Progress Notes (Signed)
*  PRELIMINARY RESULTS* Vascular Ultrasound Carotid Duplex (Doppler) has been completed.   There is no obvious evidence of hemodynamically significant internal carotid artery stenosis bilaterally. Vertebral arteries are patent with antegrade flow.  11/13/2011 2:29 PM Gertie Fey, RDMS, RDCS

## 2011-11-18 ENCOUNTER — Ambulatory Visit: Payer: Medicare Other | Admitting: Physical Therapy

## 2011-11-18 ENCOUNTER — Encounter: Payer: Medicare Other | Admitting: Physical Therapy

## 2011-11-20 ENCOUNTER — Other Ambulatory Visit: Payer: Self-pay | Admitting: Internal Medicine

## 2011-11-20 ENCOUNTER — Encounter: Payer: Medicare Other | Admitting: *Deleted

## 2011-11-26 ENCOUNTER — Ambulatory Visit: Payer: Medicare Other | Attending: Neurology | Admitting: Physical Therapy

## 2011-11-26 DIAGNOSIS — R293 Abnormal posture: Secondary | ICD-10-CM | POA: Insufficient documentation

## 2011-11-26 DIAGNOSIS — IMO0001 Reserved for inherently not codable concepts without codable children: Secondary | ICD-10-CM | POA: Insufficient documentation

## 2011-11-26 DIAGNOSIS — R269 Unspecified abnormalities of gait and mobility: Secondary | ICD-10-CM | POA: Insufficient documentation

## 2011-11-27 ENCOUNTER — Encounter: Payer: Medicare Other | Admitting: *Deleted

## 2011-11-29 ENCOUNTER — Ambulatory Visit: Payer: Medicare Other | Admitting: *Deleted

## 2011-12-02 ENCOUNTER — Ambulatory Visit: Payer: Medicare Other | Admitting: *Deleted

## 2011-12-04 ENCOUNTER — Encounter: Payer: Medicare Other | Admitting: *Deleted

## 2011-12-06 ENCOUNTER — Ambulatory Visit: Payer: Medicare Other | Admitting: *Deleted

## 2011-12-09 ENCOUNTER — Ambulatory Visit: Payer: Medicare Other | Admitting: Physical Therapy

## 2011-12-10 ENCOUNTER — Telehealth: Payer: Self-pay | Admitting: Internal Medicine

## 2011-12-10 NOTE — Telephone Encounter (Signed)
Returning call can be reached at [phone].[name]

## 2011-12-10 NOTE — Telephone Encounter (Signed)
LMTCB Seems like I remember seeing something and putting it in MW lookat but not sure

## 2011-12-10 NOTE — Telephone Encounter (Signed)
Spoke with pt. He just wants to make sure that MW received the labs per Dr. Anne Hahn. I advised will check in the am and then call and let him know Pt states okay to leave detailed msg on machine.

## 2011-12-11 NOTE — Telephone Encounter (Signed)
Labs and ov note received and are in [facility] lookat for review Pt aware and states nothing further needed

## 2011-12-11 NOTE — Telephone Encounter (Signed)
No labs or notes received Spoke with pt and notified of this I have called Dr. Clarisa Kindred office and requested last ov note and recent labs Will await fax

## 2011-12-12 ENCOUNTER — Ambulatory Visit: Payer: Medicare Other | Admitting: *Deleted

## 2011-12-16 ENCOUNTER — Ambulatory Visit: Payer: Medicare Other | Admitting: *Deleted

## 2011-12-19 ENCOUNTER — Ambulatory Visit: Payer: Medicare Other | Admitting: *Deleted

## 2011-12-24 ENCOUNTER — Ambulatory Visit: Payer: Medicare Other | Admitting: *Deleted

## 2011-12-26 ENCOUNTER — Ambulatory Visit: Payer: Medicare Other | Admitting: *Deleted

## 2011-12-30 ENCOUNTER — Ambulatory Visit: Payer: Medicare Other | Attending: Internal Medicine | Admitting: *Deleted

## 2011-12-30 DIAGNOSIS — R293 Abnormal posture: Secondary | ICD-10-CM | POA: Insufficient documentation

## 2011-12-30 DIAGNOSIS — IMO0001 Reserved for inherently not codable concepts without codable children: Secondary | ICD-10-CM | POA: Insufficient documentation

## 2011-12-30 DIAGNOSIS — R269 Unspecified abnormalities of gait and mobility: Secondary | ICD-10-CM | POA: Insufficient documentation

## 2012-01-02 ENCOUNTER — Ambulatory Visit: Payer: Medicare Other | Admitting: *Deleted

## 2012-01-10 ENCOUNTER — Other Ambulatory Visit: Payer: Self-pay | Admitting: Internal Medicine

## 2012-01-27 ENCOUNTER — Other Ambulatory Visit: Payer: Self-pay | Admitting: Internal Medicine

## 2012-02-17 ENCOUNTER — Other Ambulatory Visit: Payer: Self-pay | Admitting: Internal Medicine

## 2012-03-04 ENCOUNTER — Ambulatory Visit (INDEPENDENT_AMBULATORY_CARE_PROVIDER_SITE_OTHER): Payer: Medicare Other | Admitting: Internal Medicine

## 2012-03-04 ENCOUNTER — Other Ambulatory Visit (INDEPENDENT_AMBULATORY_CARE_PROVIDER_SITE_OTHER): Payer: Medicare Other

## 2012-03-04 ENCOUNTER — Encounter: Payer: Self-pay | Admitting: Internal Medicine

## 2012-03-04 ENCOUNTER — Ambulatory Visit (INDEPENDENT_AMBULATORY_CARE_PROVIDER_SITE_OTHER)
Admission: RE | Admit: 2012-03-04 | Discharge: 2012-03-04 | Disposition: A | Discharge status: Home / Self Care | Payer: Medicare Other | Source: Ambulatory Visit | Attending: Internal Medicine | Admitting: Internal Medicine

## 2012-03-04 VITALS — BP 122/80 | HR 56 | Temp 96.5°F | Ht 69.0 in | Wt 195.0 lb

## 2012-03-04 DIAGNOSIS — E785 Hyperlipidemia, unspecified: Secondary | ICD-10-CM

## 2012-03-04 DIAGNOSIS — I10 Essential (primary) hypertension: Secondary | ICD-10-CM

## 2012-03-04 DIAGNOSIS — D649 Anemia, unspecified: Secondary | ICD-10-CM

## 2012-03-04 DIAGNOSIS — Z23 Encounter for immunization: Secondary | ICD-10-CM

## 2012-03-04 DIAGNOSIS — R609 Edema, unspecified: Secondary | ICD-10-CM

## 2012-03-04 DIAGNOSIS — M109 Gout, unspecified: Secondary | ICD-10-CM

## 2012-03-04 DIAGNOSIS — R29898 Other symptoms and signs involving the musculoskeletal system: Secondary | ICD-10-CM

## 2012-03-04 DIAGNOSIS — N19 Unspecified kidney failure: Secondary | ICD-10-CM

## 2012-03-04 LAB — URINALYSIS, ROUTINE W REFLEX MICROSCOPIC
Nitrite: NEGATIVE
Specific Gravity, Urine: 1.01 (ref 1.000–1.030)
Total Protein, Urine: NEGATIVE
Urine Glucose: NEGATIVE
pH: 6 (ref 5.0–8.0)

## 2012-03-04 LAB — IBC PANEL
Iron: 52 ug/dL (ref 42–165)
Transferrin: 197.2 mg/dL — ABNORMAL LOW (ref 212.0–360.0)

## 2012-03-04 LAB — LIPID PANEL
HDL: 55.7 mg/dL (ref >39.00)
LDL Cholesterol: 97 mg/dL (ref 0–99)
Total CHOL/HDL Ratio: 3
Triglycerides: 46 mg/dL (ref 0.0–149.0)

## 2012-03-04 LAB — BASIC METABOLIC PANEL
CO2: 32 mEq/L (ref 19–32)
Calcium: 9.4 mg/dL (ref 8.4–10.5)
Chloride: 104 mEq/L (ref 96–112)
Sodium: 144 mEq/L (ref 135–145)

## 2012-03-04 LAB — HEPATIC FUNCTION PANEL
AST: 77 U/L — ABNORMAL HIGH (ref 0–37)
Albumin: 4.2 g/dL (ref 3.5–5.2)

## 2012-03-04 NOTE — Progress Notes (Signed)
Subjective:    Patient ID: [name], male    DOB: 1926/08/29     MRN: [id number]  HPI 41 yobm with difficult to control hypertension complicated by mild chronic renal insufficiency and tendency to chronic leg swelling L >> R - Developed Gout summer of 2009, saw gout doctor > [place] rx.   March 29, 2008 after hosp discharge for gib on asa, did not recur once asa stopped, GI svc saw pt and rec conservative rx for presumed diverticular bleed. No abd pain/wt loss.  rec maintain off asa   August 22, 2009--Returns for follow up and med review. He is doing well since last visit. No major gout flares since last visit.  Had venous dopplers last visit which were neg. Labs showed no sign. change in renal insufficiency w/ sCr at 1.7. Mild anemia, hgb sl down at 11.0. We reviewed meds today and updated his med calendar rec no change in rx   02/15/2011 f/u ov/[name] cc cpx, no new complaints, no change in chronic L > R leg swelling rec Increase your minoxidil so you take 10 mg one twice daily    04/01/2011 f/u ov/[name] for f/u hbp/cri/leg swelling  cc no change chronic L > R leg swelling, no cough or sob, tia or claudication >No changes   07/01/2011 Follow up/ NP rec Ok to take aspirin 81 mg enteric coated one daily with breakfast but stop at first sign of any bleeding   03/04/2012 f/u ov/[name] cc  No change L lower ext weakness x one year, eval by [name]> spinal eval in progress.  No increase in leg swelling, sob, cough.  Sleeping ok without nocturnal  or early am exacerbation  of respiratory  c/o's or need for noct saba. Also denies any obvious fluctuation of symptoms with weather or environmental changes or other aggravating or alleviating factors except as outlined above   ROS  The following are not active complaints unless bolded sore throat, dysphagia, dental problems, itching, sneezing,  nasal congestion or excess/ purulent secretions, ear ache,   fever, chills, sweats, unintended wt loss,  pleuritic or exertional cp, hemoptysis,  orthopnea pnd or leg swelling, presyncope, palpitations, heartburn, abdominal pain, anorexia, nausea, vomiting, diarrhea  or change in bowel or urinary habits, change in stools or urine, dysuria,hematuria,  rash, arthralgias, visual complaints, headache, numbness weakness or ataxia or problems with walking or coordination,  change in mood/affect or memory.            Past Medical History:  Diverticulosis..............................................................Marland KitchenPatterson  - Colonoscopy 02/28/06  - LGIB 03/15/2008 while on asa, dc'd  Gout  - Uric acid 10 01/2009 > allopurinol started March 21, 2009 > recheck 08/16/2010  7.8  Left leg swelling onset 05/2009  - Venous dopplers Jul 11, 2009 >>>neg  Hyperlipidemia  - Target LDL < 70 (hbp, PVD)   OLECRANON BURSITIIS........................................................Marland KitchenYates  Renal failure baseline creat 1.5  Hypertension  PROSTATE CA......................................................................Marland KitchenWrenn  - Cryotherapy 02/2004  ANEMIA  -08/22/09 hbg 11.0, iron studies> 11% sat  L leg weakness ......................................................................Marland Kitchen [name]..........................................................Marland KitchenWert  - DT 03/04/2012  - Pneumovax 10/2002 (age 79 last shot needed) - CPX 03/04/2012   Complex Med regimen  -Meds calendar adjust-February 15, 2008 , August 22, 2009 , 07/01/2011    Family History:  HBP brother  Pancreatic ca brother  CVA father and ? mother   Social History:  Never smoker  No ETOH  Retired, stays busy with church activities, no aerobics  Single  Objective:   Physical Exam Ambulatory healthy appearing elderly bm in no acute distress.  wt 206 March 29, 2008 > 201 March 21, 2009 > 203 Jul 11, 2009 > 204 October 16, 2009 > 205 February 08, 2010 > 02/15/2011 202  > 04/01/2011  207 >209 07/01/2011 > 03/04/2012  195 HEENT:  nl dentition, turbinates, and orophanx. EAC clear with coarse ear hair- no wax seen TMs normal  Neck without JVD/Nodes/TM ,+right carotid bruit , 2+pulses  Lungs clear to A and P bilaterally without cough on insp or exp maneuvers  RRR grade 1-2 SM ,  bilateral mod L, mild pitting R  Abd soft and benign with nl excursion in the supine position. No bruits or organomegaly  Ext warm without calf tenderness, cyanosis clubbing. Neg Homan's  MS Kyphotic spine, moderate, arthritic changes of the hands. w no obvious joint deformity, restriction,  Mod stooped over gait    CXR  03/04/2012 :   Stable cardiomegaly. No active disease.        Assessment & Plan:

## 2012-03-04 NOTE — Patient Instructions (Addendum)
Ok to leave off the simvastatin and the iron as per your medication calendar  Please remember to go to the lab and x-ray department downstairs for your tests - we will call you with the results when they are available.     Please schedule a follow up visit in 3 months but call sooner if needed to see Tammy with all your medications for a new med calendar

## 2012-03-05 NOTE — Progress Notes (Signed)
Quick Note:  Left detailed msg on machine with results ______ 

## 2012-03-05 NOTE — Assessment & Plan Note (Signed)
-   LGIB 03/15/2008 while on asa, dc'd   Back on asa 81 mg daily and no drop in hgb or Iron rx, which is adequate so ok to d/c feosol @ fe sat 18%

## 2012-03-05 NOTE — Assessment & Plan Note (Signed)
Adequate control on present rx, reviewed  

## 2012-03-05 NOTE — Assessment & Plan Note (Signed)
-   target LDL < 70 due PVD - Trial off statins due to myositis Fall 2013  Adequate control on present rx, reviewed need to leave off statins.

## 2012-03-05 NOTE — Progress Notes (Signed)
Quick Note:  Left detailed ms on machine with results ______

## 2012-03-05 NOTE — Assessment & Plan Note (Signed)
Lab Results  Component Value Date   CREATININE 1.9* 03/04/2012   CREATININE 1.7* 09/30/2011   CREATININE 1.8* 07/01/2011     Adequate control on present rx, reviewed

## 2012-03-05 NOTE — Assessment & Plan Note (Signed)
- -   Venous dopplers   Jul 11, 2009 >>>neg   No change / Adequate control on present rx, reviewed

## 2012-03-14 ENCOUNTER — Emergency Department (HOSPITAL_COMMUNITY): Payer: Medicare Other

## 2012-03-14 ENCOUNTER — Other Ambulatory Visit: Payer: Self-pay

## 2012-03-14 ENCOUNTER — Inpatient Hospital Stay (HOSPITAL_COMMUNITY)
Admission: EM | Admit: 2012-03-14 | Discharge: 2012-03-19 | DRG: 558 | Disposition: A | Discharge status: Skilled Nursing Facility | Payer: Medicare Other | Attending: Family Medicine | Admitting: Family Medicine

## 2012-03-14 ENCOUNTER — Encounter (HOSPITAL_COMMUNITY): Payer: Self-pay

## 2012-03-14 DIAGNOSIS — D638 Anemia in other chronic diseases classified elsewhere: Secondary | ICD-10-CM | POA: Diagnosis present

## 2012-03-14 DIAGNOSIS — D649 Anemia, unspecified: Secondary | ICD-10-CM

## 2012-03-14 DIAGNOSIS — D72829 Elevated white blood cell count, unspecified: Secondary | ICD-10-CM | POA: Diagnosis present

## 2012-03-14 DIAGNOSIS — N289 Disorder of kidney and ureter, unspecified: Secondary | ICD-10-CM

## 2012-03-14 DIAGNOSIS — K5900 Constipation, unspecified: Secondary | ICD-10-CM | POA: Diagnosis not present

## 2012-03-14 DIAGNOSIS — E860 Dehydration: Secondary | ICD-10-CM | POA: Diagnosis present

## 2012-03-14 DIAGNOSIS — I10 Essential (primary) hypertension: Secondary | ICD-10-CM

## 2012-03-14 DIAGNOSIS — I129 Hypertensive chronic kidney disease with stage 1 through stage 4 chronic kidney disease, or unspecified chronic kidney disease: Secondary | ICD-10-CM | POA: Diagnosis present

## 2012-03-14 DIAGNOSIS — I252 Old myocardial infarction: Secondary | ICD-10-CM

## 2012-03-14 DIAGNOSIS — M6282 Rhabdomyolysis: Principal | ICD-10-CM | POA: Diagnosis present

## 2012-03-14 DIAGNOSIS — N19 Unspecified kidney failure: Secondary | ICD-10-CM

## 2012-03-14 DIAGNOSIS — R55 Syncope and collapse: Secondary | ICD-10-CM

## 2012-03-14 DIAGNOSIS — W19XXXA Unspecified fall, initial encounter: Secondary | ICD-10-CM

## 2012-03-14 DIAGNOSIS — R710 Precipitous drop in hematocrit: Secondary | ICD-10-CM | POA: Diagnosis not present

## 2012-03-14 DIAGNOSIS — K5730 Diverticulosis of large intestine without perforation or abscess without bleeding: Secondary | ICD-10-CM | POA: Diagnosis present

## 2012-03-14 DIAGNOSIS — E785 Hyperlipidemia, unspecified: Secondary | ICD-10-CM | POA: Diagnosis present

## 2012-03-14 DIAGNOSIS — Z79899 Other long term (current) drug therapy: Secondary | ICD-10-CM

## 2012-03-14 DIAGNOSIS — R609 Edema, unspecified: Secondary | ICD-10-CM | POA: Diagnosis present

## 2012-03-14 DIAGNOSIS — R5381 Other malaise: Secondary | ICD-10-CM | POA: Diagnosis present

## 2012-03-14 DIAGNOSIS — M7989 Other specified soft tissue disorders: Secondary | ICD-10-CM | POA: Diagnosis present

## 2012-03-14 DIAGNOSIS — Z7982 Long term (current) use of aspirin: Secondary | ICD-10-CM

## 2012-03-14 DIAGNOSIS — Z8546 Personal history of malignant neoplasm of prostate: Secondary | ICD-10-CM

## 2012-03-14 DIAGNOSIS — R29898 Other symptoms and signs involving the musculoskeletal system: Secondary | ICD-10-CM | POA: Diagnosis present

## 2012-03-14 DIAGNOSIS — N183 Chronic kidney disease, stage 3 unspecified: Secondary | ICD-10-CM | POA: Diagnosis present

## 2012-03-14 DIAGNOSIS — M109 Gout, unspecified: Secondary | ICD-10-CM | POA: Diagnosis present

## 2012-03-14 DIAGNOSIS — N179 Acute kidney failure, unspecified: Secondary | ICD-10-CM | POA: Diagnosis present

## 2012-03-14 LAB — COMPREHENSIVE METABOLIC PANEL
ALT: 114 U/L — ABNORMAL HIGH (ref 0–53)
AST: 378 U/L — ABNORMAL HIGH (ref 0–37)
Albumin: 3.7 g/dL (ref 3.5–5.2)
CO2: 26 mEq/L (ref 19–32)
Chloride: 104 mEq/L (ref 96–112)
GFR calc non Af Amer: 24 mL/min — ABNORMAL LOW (ref >90)
Potassium: 3.7 mEq/L (ref 3.5–5.1)
Sodium: 145 mEq/L (ref 135–145)
Total Bilirubin: 0.9 mg/dL (ref 0.3–1.2)

## 2012-03-14 LAB — URINE MICROSCOPIC-ADD ON

## 2012-03-14 LAB — URINALYSIS, ROUTINE W REFLEX MICROSCOPIC
Bilirubin Urine: NEGATIVE
Glucose, UA: NEGATIVE mg/dL
Protein, ur: 100 mg/dL — AB
Specific Gravity, Urine: 1.014 (ref 1.005–1.030)
Urobilinogen, UA: 0.2 mg/dL (ref 0.0–1.0)

## 2012-03-14 LAB — CBC
Platelets: 179 10*3/uL (ref 150–400)
RBC: 3.08 MIL/uL — ABNORMAL LOW (ref 4.22–5.81)
RDW: 14.3 % (ref 11.5–15.5)
WBC: 19.7 10*3/uL — ABNORMAL HIGH (ref 4.0–10.5)

## 2012-03-14 LAB — CK TOTAL AND CKMB (NOT AT ARMC): Relative Index: 0.8 (ref 0.0–2.5)

## 2012-03-14 LAB — POCT I-STAT TROPONIN I

## 2012-03-14 MED ORDER — PIPERACILLIN-TAZOBACTAM 3.375 G IVPB 30 MIN
3.3750 g | Freq: Once | INTRAVENOUS | Status: AC
  Administered 2012-03-15: 3.375 g via INTRAVENOUS
  Filled 2012-03-14: qty 50

## 2012-03-14 MED ORDER — SODIUM CHLORIDE 0.9 % IV SOLN
Freq: Once | INTRAVENOUS | Status: AC
  Administered 2012-03-14: 19:00:00 via INTRAVENOUS

## 2012-03-14 MED ORDER — SODIUM CHLORIDE 0.9 % IV SOLN
INTRAVENOUS | Status: AC
  Administered 2012-03-15: 01:00:00 via INTRAVENOUS

## 2012-03-14 MED ORDER — SODIUM CHLORIDE 0.9 % IV BOLUS (SEPSIS): 500.0000 mL | Freq: Once | INTRAVENOUS | Status: DC

## 2012-03-14 MED ORDER — ASPIRIN EC 81 MG PO TBEC
81.0000 mg | DELAYED_RELEASE_TABLET | Freq: Every day | ORAL | Status: DC
  Administered 2012-03-15 – 2012-03-17 (×3): 81 mg via ORAL
  Filled 2012-03-14 (×4): qty 1

## 2012-03-14 MED ORDER — SODIUM CHLORIDE 0.9 % IV SOLN: INTRAVENOUS | Status: DC

## 2012-03-14 MED ORDER — PIPERACILLIN-TAZOBACTAM 3.375 G IVPB
3.3750 g | Freq: Three times a day (TID) | INTRAVENOUS | Status: DC
  Administered 2012-03-15 (×2): 3.375 g via INTRAVENOUS
  Filled 2012-03-14 (×4): qty 50

## 2012-03-14 MED ORDER — ENOXAPARIN SODIUM 30 MG/0.3ML ~~LOC~~ SOLN
30.0000 mg | SUBCUTANEOUS | Status: DC
  Administered 2012-03-15 – 2012-03-17 (×3): 30 mg via SUBCUTANEOUS
  Filled 2012-03-14 (×3): qty 0.3

## 2012-03-14 NOTE — H&P (Addendum)
PCP:   Sandrea Hughs, MD   Chief Complaint:  Found down  HPI: 79 yo male lives alone and was not answering phone by family members, they called ems to ck on him and he was found down.  Pt is overall dehydrated.  Denies any pain.  Family have left the building.  No report from ED staff or pt of fevers.  No n/v/d.  He denies any cp or sob.  No h/o dementia.  Found to have rhabdo in ED with a on ckd.  Is receiving his second liter of ivf bolus right now.  Pt is resting but arouuses to voice and has no complaints.  Review of Systems:  O/w neg per pt but unsure about reliabity   Past Medical History: Past Medical History  Diagnosis Date  . Diverticulosis   . Gout   . Leg swelling     left  . Hyperlipidemia   . History of myocardial infarction   . Olecranon bursitis   . Renal failure   . HTN (hypertension)   . Prostate cancer   . Anemia   . Healthcare maintenance    Past Surgical History  Procedure Date  . R ia aneurysmectomy     Medications: Prior to Admission medications   Medication Sig Start Date End Date Taking? Authorizing Provider  acetaminophen (TYLENOL) 325 MG tablet Take 650 mg by mouth 2 (two) times daily as needed. For pain   Yes Historical Provider, MD  allopurinol (ZYLOPRIM) 300 MG tablet Take 300 mg by mouth daily.   Yes Historical Provider, MD  aspirin EC 81 MG tablet Take 81 mg by mouth daily.   Yes Historical Provider, MD  atenolol (TENORMIN) 100 MG tablet Take 50 mg by mouth daily.   Yes Historical Provider, MD  cloNIDine (CATAPRES) 0.2 MG tablet Take 0.1-0.2 mg by mouth 2 (two) times daily. 0.1 mg  (1/2 tablet) every morning and 0.1 mg (1 tablet) at bedtime   Yes Historical Provider, MD  colchicine 0.6 MG tablet Take 0.6 mg by mouth every 6 (six) hours as needed. For gout   Yes Historical Provider, MD  furosemide (LASIX) 80 MG tablet Take 80 mg by mouth 2 (two) times daily.   Yes Historical Provider, MD  minoxidil (LONITEN) 10 MG tablet Take 10 mg by mouth 2  (two) times daily.   Yes Historical Provider, MD  Multiple Vitamin (MULTIVITAMIN WITH MINERALS) TABS Take 1 tablet by mouth daily. Centrum silver   Yes Historical Provider, MD  Multiple Vitamins-Minerals (PROTEGRA PO) Take 1 capsule by mouth daily.     Yes Historical Provider, MD    Allergies:  No Known Allergies  Social History:  reports that he has never smoked. He has never used smokeless tobacco. He reports that he does not drink alcohol. His drug history not on file.  Family History: Family History  Problem Relation Age of Onset  . Hypertension Brother   . Pancreatic cancer Brother   . Stroke Father   . Stroke Mother     Physical Exam: Filed Vitals:   03/14/12 1830 03/14/12 1930 03/14/12 2042 03/14/12 2115  BP: 170/101 160/72 132/62 139/59  Pulse: 60 65 57 60  Temp:      TempSrc:      Resp: 11 16 15 15   SpO2: 100% 100% 100% 100%   General appearance: alert, cooperative and no distress  Appears dehydrated with dry MMM Neck: no JVD and supple, symmetrical, trachea midline Lungs: clear to auscultation bilaterally Heart:  regular rate and rhythm, S1, S2 normal, no murmur, click, rub or gallop Abdomen: soft, non-tender; bowel sounds normal; no masses,  no organomegaly Extremities: edema 2+ ble pitting edema  MAE with no pain Pulses: 2+ and symmetric Skin: Skin color, texture, turgor normal. No rashes or lesions Neurologic: Grossly normal    Labs on Admission:   [facility] 03/14/12 1748  NA 145  K 3.7  CL 104  CO2 26  GLUCOSE 115*  BUN 54*  CREATININE 2.34*  CALCIUM 9.3  MG --  PHOS --    Basename 03/14/12 1748  AST 378*  ALT 114*  ALKPHOS 61  BILITOT 0.9  PROT 7.1  ALBUMIN 3.7     Basename 03/14/12 1748  WBC 19.7*  NEUTROABS --  HGB 9.8*  HCT 29.7*  MCV 96.4  PLT 179    Basename 03/14/12 1748  CKTOTAL 91478*  CKMB 137.2*  CKMBINDEX --  TROPONINI --    Radiological Exams on Admission: Dg Chest 2 View  03/14/2012  *RADIOLOGY REPORT*   Clinical Data: Altered mental status.  Weakness in the left leg.  CHEST - 2 VIEW  Comparison: 03/04/2012  Findings: Cardiomegaly.  Calcified tortuous aorta.  Clear lung fields.  No bony abnormality.  IMPRESSION: No acute disease.   Original Report Authenticated By: Davonna Belling, M.D.    Dg Chest 2 View  03/04/2012  *RADIOLOGY REPORT*  Clinical Data: History of hypertension.  CHEST - 2 VIEW  Comparison: 02/15/2011  Findings: Stable cardiomegaly.  There is also stable tortuosity of the thoracic aorta.  The lungs are clear and show no evidence of infiltrates, nodules or pulmonary edema.  No pleural fluid is identified.  Stable mild degenerative changes are present in the lower thoracic spine.  IMPRESSION: Stable cardiomegaly.  No active disease.   Original Report Authenticated By: Irish Lack, M.D.    Ct Head Wo Contrast  03/14/2012  *RADIOLOGY REPORT*  Clinical Data: Weakness in left leg.  Found on floor in his apartment.  CT HEAD WITHOUT CONTRAST  Technique:  Contiguous axial images were obtained from the base of the skull through the vertex without contrast.  Comparison: MRI brain 10/23/2011.  Findings:  There is no evidence for acute infarction, intracranial hemorrhage, mass lesion, hydrocephalus, or extra-axial fluid.Moderate atrophy is present.  There is moderately advanced chronic microvascular ischemic change of the periventricular and subcortical white matter.  Calvarium is intact.  Carotid and vertebral atherosclerosis is noted.  There is no acute sinus or mastoid disease.  Compared with prior MR, the appearance is similar.  IMPRESSION: No acute intracranial ischemic or post traumatic findings.  Chronic changes as described.   Original Report Authenticated By: Davonna Belling, M.D.     Assessment/Plan  79 yo male found down with rhabdo, dehydration and worsening renal failure Principal Problem:  *Rhabdomyolysis Active Problems:  RENAL FAILURE  EDEMA, CHRONIC  Left leg weakness  Leukocytosis   Dehydration  Pt can follow commands and MAE without pain so do not suspect any traumatic injuries.  No focal neuro deficits either.  Will obtain frequent neuro cks overnight.  Very dry on exam.  No foci of infection, ua and cxr are neg.  Pt is afebrile however wbc is elevated which may be stress reactant.  Ck lactic acid level and procalcitonin level.  No abx at this time.  His bp is labile in the ED and had one reading of sbp less than 80 but others are high.  Getting second liter of ivf now.  Place on tele unless his bp persists to be low then will have to go to stepdown.  Place foley for accurate uop.  Hold all of his bp meds was also on diuretic hold.  Will ck one trop also to r/o silent MI.  ekg nsr.  Presumptive full code.    Raelin Pixler A 03/14/2012, 10:27 PM   Empirically start zosyn for possible aspiration, repeat cxr tomorrow.

## 2012-03-14 NOTE — ED Notes (Signed)
Pt sleeping. Pt had BP drop to 81/39. Cuff switched to other arm with repeat BP of 77/35. Pt attempted to be awoken with no response. Sternal rub administered and ammonia. After appx 1 min pt starting to respond verbally, following commands. O2 sats 100% RA. BP increased to 110/52 and repeat 128/57. Admitting MD at bedside.

## 2012-03-14 NOTE — ED Provider Notes (Signed)
History     CSN: [id number]  Arrival date & time 03/14/12  1735   None     Chief Complaint  Patient presents with  . Altered Mental Status    (Consider location/radiation/quality/duration/timing/severity/associated sxs/prior treatment) Patient is a 79 y.o. male presenting with altered mental status. The history is provided by the patient and a relative. No language interpreter was used.  Altered Mental Status Chronicity: The patient was found on the floor of his bathroom by family members. He was lying face down. He seemed confused initially but is more mentally clear at the present. The current episode started 1 to 2 hours ago. Episode frequency: He says that he has been weak, and had weakness in the left leg, which has been evaluated by Stephanie Acre M.D., neurologist. He says his leg gives out and he fell down, and could not get up. The problem has been gradually improving. Pertinent negatives include no chest pain, no abdominal pain, no headaches and no shortness of breath. Nothing aggravates the symptoms. Nothing relieves the symptoms. Treatments tried: He was brought to [facility] [place] by EMS.    Past Medical History  Diagnosis Date  . Diverticulosis   . Gout   . Leg swelling     left  . Hyperlipidemia   . History of myocardial infarction   . Olecranon bursitis   . Renal failure   . HTN (hypertension)   . Prostate cancer   . Anemia   . Healthcare maintenance     Past Surgical History  Procedure Date  . R ia aneurysmectomy     Family History  Problem Relation Age of Onset  . Hypertension Brother   . Pancreatic cancer Brother   . Stroke Father   . Stroke Mother     History  Substance Use Topics  . Smoking status: Never Smoker   . Smokeless tobacco: Never Used  . Alcohol Use: No      Review of Systems  Constitutional: Negative.  Negative for fever and chills.       Generalized weakness.  HENT: Negative.   Eyes: Negative.   Respiratory: Negative.   Negative for shortness of breath.   Cardiovascular: Negative for chest pain.       He has a history of hypertension.  Gastrointestinal: Negative.  Negative for abdominal pain.  Genitourinary: Negative.   Musculoskeletal:       He has arthritis and gout.  Neurological: Negative for headaches. Weakness: he has weakness, and says his weakness is mainly in his left leg.  Psychiatric/Behavioral: Positive for altered mental status.    Allergies  Review of patient's allergies indicates no known allergies.  Home Medications   Current Outpatient Rx  Name  Route  Sig  Dispense  Refill  . ACETAMINOPHEN 325 MG PO TABS   Oral   Take 650 mg by mouth 2 (two) times daily as needed. For pain         . ALLOPURINOL 300 MG PO TABS   Oral   Take 300 mg by mouth daily.         . ASPIRIN EC 81 MG PO TBEC   Oral   Take 81 mg by mouth daily.         . ATENOLOL 100 MG PO TABS   Oral   Take 50 mg by mouth daily.         Marland Kitchen CLONIDINE HCL 0.2 MG PO TABS   Oral   Take 0.1-0.2 mg by mouth  2 (two) times daily. 0.1 mg  (1/2 tablet) every morning and 0.1 mg (1 tablet) at bedtime         . COLCHICINE 0.6 MG PO TABS   Oral   Take 0.6 mg by mouth every 6 (six) hours as needed. For gout         . FUROSEMIDE 80 MG PO TABS   Oral   Take 80 mg by mouth 2 (two) times daily.         Marland Kitchen MINOXIDIL 10 MG PO TABS   Oral   Take 10 mg by mouth 2 (two) times daily.         . ADULT MULTIVITAMIN W/MINERALS CH   Oral   Take 1 tablet by mouth daily. Centrum silver         . PROTEGRA PO   Oral   Take 1 capsule by mouth daily.             BP 172/82  Pulse 60  Temp 98.6 F (37 C) (Oral)  Resp 16  SpO2 100%  Physical Exam  Nursing note and vitals reviewed. Constitutional: He is oriented to person, place, and time. He appears well-developed and well-nourished. No distress.       Elderly man in no distress. He is able answer most questions about his history.  HENT:  Head: Normocephalic  and atraumatic.  Right Ear: External ear normal.  Left Ear: External ear normal.  Mouth/Throat: Oropharynx is clear and moist.  Eyes: Conjunctivae normal and EOM are normal. Pupils are equal, round, and reactive to light.  Neck: Normal range of motion. Neck supple.  Cardiovascular: Normal rate, regular rhythm and normal heart sounds.   Pulmonary/Chest: Effort normal and breath sounds normal.  Abdominal: Soft. Bowel sounds are normal.  Musculoskeletal: Normal range of motion.       He has 2+ pitting edema of both ankles.  Neurological: He is oriented to person, place, and time.       His left leg is definitely weaker than the right.  Skin: Skin is warm and dry.  Psychiatric: He has a normal mood and affect. His behavior is normal.    ED Course  Procedures (including critical care time)   6:23 PM  Dg Chest 2 View  03/04/2012  *RADIOLOGY REPORT*  Clinical Data: History of hypertension.  CHEST - 2 VIEW  Comparison: 02/15/2011  Findings: Stable cardiomegaly.  There is also stable tortuosity of the thoracic aorta.  The lungs are clear and show no evidence of infiltrates, nodules or pulmonary edema.  No pleural fluid is identified.  Stable mild degenerative changes are present in the lower thoracic spine.  IMPRESSION: Stable cardiomegaly.  No active disease.   Original Report Authenticated By: Irish Lack, M.D.     Date: 03/14/2012  Rate:60  Rhythm: normal sinus rhythm  QRS Axis: normal  Intervals: normal QRS:  Poor R wave progression in precordial leads suggests possible old anterior myocardial infarction.  ST/T Wave abnormalities: nonspecific T wave changes  Conduction Disutrbances:none  Narrative Interpretation:   Old EKG Reviewed: unchanged  6:38 PM Pt was seen and had physical examination. Lab tests were ordered.  IV fluids were ordered. Old charts were reviewed, show a Hx of gout and hypertension.   9:21 PM Results for orders placed during the hospital encounter of  03/14/12  CBC      Component Value Range   WBC 19.7 (*) 4.0 - 10.5 K/uL   RBC 3.08 (*) 4.22 -  5.81 MIL/uL   Hemoglobin 9.8 (*) 13.0 - 17.0 g/dL   HCT 96.2 (*) 95.2 - 84.1 %   MCV 96.4  78.0 - 100.0 fL   MCH 31.8  26.0 - 34.0 pg   MCHC 33.0  30.0 - 36.0 g/dL   RDW 32.4  40.1 - 02.7 %   Platelets 179  150 - 400 K/uL  COMPREHENSIVE METABOLIC PANEL      Component Value Range   Sodium 145  135 - 145 mEq/L   Potassium 3.7  3.5 - 5.1 mEq/L   Chloride 104  96 - 112 mEq/L   CO2 26  19 - 32 mEq/L   Glucose, Bld 115 (*) 70 - 99 mg/dL   BUN 54 (*) 6 - 23 mg/dL   Creatinine, Ser 2.53 (*) 0.50 - 1.35 mg/dL   Calcium 9.3  8.4 - 66.4 mg/dL   Total Protein 7.1  6.0 - 8.3 g/dL   Albumin 3.7  3.5 - 5.2 g/dL   AST 403 (*) 0 - 37 U/L   ALT 114 (*) 0 - 53 U/L   Alkaline Phosphatase 61  39 - 117 U/L   Total Bilirubin 0.9  0.3 - 1.2 mg/dL   GFR calc non Af Amer 24 (*) >90 mL/min   GFR calc Af Amer 28 (*) >90 mL/min  URINALYSIS, ROUTINE W REFLEX MICROSCOPIC      Component Value Range   Color, Urine YELLOW  YELLOW   APPearance CLEAR  CLEAR   Specific Gravity, Urine 1.014  1.005 - 1.030   pH 6.0  5.0 - 8.0   Glucose, UA NEGATIVE  NEGATIVE mg/dL   Hgb urine dipstick LARGE (*) NEGATIVE   Bilirubin Urine NEGATIVE  NEGATIVE   Ketones, ur NEGATIVE  NEGATIVE mg/dL   Protein, ur 474 (*) NEGATIVE mg/dL   Urobilinogen, UA 0.2  0.0 - 1.0 mg/dL   Nitrite NEGATIVE  NEGATIVE   Leukocytes, UA NEGATIVE  NEGATIVE  GLUCOSE, CAPILLARY      Component Value Range   Glucose-Capillary 89  70 - 99 mg/dL  CK TOTAL AND CKMB      Component [place]   [place] [address] (*) 7 - 232 U/L   CK, MB 137.2 (*) 0.3 - 4.0 ng/mL   Relative Index 0.8  0.0 - 2.5  URINE MICROSCOPIC-ADD ON      Component Value Range   Squamous Epithelial / LPF RARE  RARE   Casts EPITHELIAL CASTS (*) NEGATIVE   Dg Chest 2 View  03/14/2012  *RADIOLOGY REPORT*  Clinical Data: Altered mental status.  Weakness in the left leg.  CHEST - 2 VIEW   Comparison: 03/04/2012  Findings: Cardiomegaly.  Calcified tortuous aorta.  Clear lung fields.  No bony abnormality.  IMPRESSION: No acute disease.   Original Report Authenticated By: Davonna Belling, M.D.     Ct Head Wo Contrast  03/14/2012  *RADIOLOGY REPORT*  Clinical Data: Weakness in left leg.  Found on floor in his apartment.  CT HEAD WITHOUT CONTRAST  Technique:  Contiguous axial images were obtained from the base of the skull through the vertex without contrast.  Comparison: MRI brain 10/23/2011.  Findings:  There is no evidence for acute infarction, intracranial hemorrhage, mass lesion, hydrocephalus, or extra-axial fluid.Moderate atrophy is present.  There is moderately advanced chronic microvascular ischemic change of the periventricular and subcortical white matter.  Calvarium is intact.  Carotid and vertebral atherosclerosis is noted.  There is no acute sinus or mastoid disease.  Compared with prior MR, the appearance is similar.  IMPRESSION: No acute intracranial ischemic or post traumatic findings.  Chronic changes as described.   Original Report Authenticated By: Davonna Belling, M.D.     9:23 PM Case discussed initially with PCCM, who referred him to [facility].  Case discussed with Dr. Onalee Hua --> admit to telemetry to [facility].    1. Fall   2. Rhabdomyolysis   3. Renal insufficiency            Carleene Cooper III, MD 03/14/12 2125

## 2012-03-14 NOTE — ED Notes (Addendum)
PT AO x 4. Reports last night having a fall and "so I just decided to lay there". Pt appears dehydrated. No injuries noted. Pt denies pain/complaint. VSS.

## 2012-03-14 NOTE — Progress Notes (Signed)
79yo male to begin Zosyn for possible aspiration.  Will begin Zosyn 3.375g IV Q8H for CrCl ~26 ml/min.  Vernard Gambles, PharmD, BCPS 03/14/2012 11:56 PM
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
## 2012-03-14 NOTE — ED Notes (Signed)
If needed, contact #'s for family:  Brighten Orndoff   [address] [place]  [phone]    [place]     [phone]

## 2012-03-15 ENCOUNTER — Inpatient Hospital Stay (HOSPITAL_COMMUNITY): Payer: Medicare Other

## 2012-03-15 DIAGNOSIS — R55 Syncope and collapse: Secondary | ICD-10-CM

## 2012-03-15 DIAGNOSIS — I252 Old myocardial infarction: Secondary | ICD-10-CM

## 2012-03-15 LAB — CBC
Hemoglobin: 8.6 g/dL — ABNORMAL LOW (ref 13.0–17.0)
MCV: 96.3 fL (ref 78.0–100.0)
Platelets: 167 10*3/uL (ref 150–400)
RBC: 2.73 MIL/uL — ABNORMAL LOW (ref 4.22–5.81)
WBC: 15 10*3/uL — ABNORMAL HIGH (ref 4.0–10.5)

## 2012-03-15 LAB — BASIC METABOLIC PANEL
CO2: 26 mEq/L (ref 19–32)
Calcium: 8.7 mg/dL (ref 8.4–10.5)
GFR calc non Af Amer: 24 mL/min — ABNORMAL LOW (ref >90)
Potassium: 3.6 mEq/L (ref 3.5–5.1)
Sodium: 145 mEq/L (ref 135–145)

## 2012-03-15 LAB — CK TOTAL AND CKMB (NOT AT ARMC)
Relative Index: 0.7 (ref 0.0–2.5)
Relative Index: 0.2 (ref 0.0–2.5)
Total CK: 13199 U/L — ABNORMAL HIGH (ref 7–232)
Total CK: 10118 U/L — ABNORMAL HIGH (ref 7–232)

## 2012-03-15 LAB — PROCALCITONIN: Procalcitonin: 0.21 ng/mL

## 2012-03-15 MED ORDER — AMLODIPINE BESYLATE 5 MG PO TABS
5.0000 mg | ORAL_TABLET | Freq: Every day | ORAL | Status: DC
  Administered 2012-03-15 – 2012-03-19 (×5): 5 mg via ORAL
  Filled 2012-03-15 (×5): qty 1

## 2012-03-15 MED ORDER — SODIUM CHLORIDE 0.9 % IV SOLN: INTRAVENOUS | Status: AC

## 2012-03-15 NOTE — Progress Notes (Signed)
[facility] PROGRESS NOTE  [name]:[id number] DOB: [birth year] DOA: 03/14/2012 PCP: Sandrea Hughs, MD  Assessment/Plan: 1-Syncope/collapse: continue telemetry evaluation. -check orthostatic -CT no acute abnormalities -check 2-D echo and carotid dopplers -IVF's and supportive care -will continue holding diuretics and vasodilators.  2-Rhabdomyolysis: continue IVF. CK levels trending down.  3-Acute on chronic renal failure: due to continue use of diuretics and rhabdomyolysis. Will continue holding diuretics and continue IVF's.  4-HTN: BP rising and now maintaining above 150 range; will start amlodipine 5 mg daily as new antihypertensive drug.  5-Hx of MI: no CP, EKG and troponin w/o acute ischemic changes. Continue ASA.  6-Leukocytosis: improving after fluids. No signs or source of infection identified. Most likely demargination. Will discontinue zosyn. Follow WBC trend.  DVT: Lovenox  Code Status: Full Family Communication: no family at bedside Disposition Plan: will ask PT/OT to evaluate patient for recommendations at discharge.   Consultants:  PT/OT  Procedures:  2-d echo and carotid doppler pending  CT head neg for acute abnormality  CXR 2 views no acute cardiopulmonary process.  Antibiotics:  Zosyn 2 doses after admission for concerns of aspiration.  HPI/Subjective: Afebrile; feeling better. AAOX3. Denies CP or SOB.  Objective: Filed Vitals:   03/14/12 2330 03/14/12 2349 03/15/12 0400 03/15/12 1300  BP: 173/60  158/74 154/70  Pulse: 64  66 78  Temp: 100.1 F (37.8 C) 98 F (36.7 C) 97.3 F (36.3 C) 98.8 F (37.1 C)  TempSrc:    Oral  Resp: 20  20 18   Height: 5\' 10"  (1.778 m)     Weight: 85.7 kg (188 lb 15 oz)     SpO2: 100%  96% 99%    Intake/Output Summary (Last 24 hours) at 03/15/12 1641 Last data filed at 03/15/12 1300  Gross per 24 hour  Intake 2612.92 ml  Output      1 ml  Net 2611.92 ml   Filed Weights   03/14/12 2300  03/14/12 2330  Weight: 88.451 kg (195 lb) 85.7 kg (188 lb 15 oz)    Exam:   General:  NAD; denies SOB, no CP, breathing comfortable  Cardiovascular: S1 and S2; positive SEM, no rubs, no gallops  Respiratory: CTA bilaterally  Abdomen: soft, NT, no distended, positive BS  Neuro: non focal  Data Reviewed: Basic Metabolic Panel:  Lab 03/15/12 8119 03/14/12 1748  NA 145 145  K 3.6 3.7  CL 107 104  CO2 26 26  GLUCOSE 93 115*  BUN 55* 54*  CREATININE 2.34* 2.34*  CALCIUM 8.7 9.3  MG -- --  PHOS -- --   Liver Function Tests:  Lab 03/14/12 1748  AST 378*  ALT 114*  ALKPHOS 61  BILITOT 0.9  PROT 7.1  ALBUMIN 3.7   CBC:  Lab 03/15/12 0550 03/14/12 1748  WBC 15.0* 19.7*  NEUTROABS -- --  HGB 8.6* 9.8*  HCT 26.3* 29.7*  MCV 96.3 96.4  PLT 167 179   Cardiac Enzymes:  Lab 03/15/12 1042 03/15/12 0550 03/14/12 2233 03/14/12 1748  CKTOTAL 9567* 10118* 13199* 16825*  CKMB 33.9* 43.5* 89.3* 137.2*  CKMBINDEX -- -- -- --  TROPONINI -- -- -- --   CBG:  Lab 03/14/12 1744  GLUCAP 89    Studies: Dg Chest 2 View  03/15/2012  *RADIOLOGY REPORT*  Clinical Data: Rule out pneumonia  CHEST - 2 VIEW  Comparison: Prior chest x-ray 03/14/2012  Findings: No significant interval change in the appearance of the chest compared to yesterday.  No developing  airspace consolidation to suggest pneumonia.  Unchanged cardiomegaly.  No acute osseous abnormality.  No pleural effusion, pneumothorax or pulmonary edema.  IMPRESSION: No acute cardiopulmonary disease and no significant interval change compared to yesterday's chest x-ray.   Original Report Authenticated By: Malachy Moan, M.D.    Dg Chest 2 View  03/14/2012  *RADIOLOGY REPORT*  Clinical Data: Altered mental status.  Weakness in the left leg.  CHEST - 2 VIEW  Comparison: 03/04/2012  Findings: Cardiomegaly.  Calcified tortuous aorta.  Clear lung fields.  No bony abnormality.  IMPRESSION: No acute disease.   Original Report  Authenticated By: Davonna Belling, M.D.    Ct Head Wo Contrast  03/14/2012  *RADIOLOGY REPORT*  Clinical Data: Weakness in left leg.  Found on floor in his apartment.  CT HEAD WITHOUT CONTRAST  Technique:  Contiguous axial images were obtained from the base of the skull through the vertex without contrast.  Comparison: MRI brain 10/23/2011.  Findings:  There is no evidence for acute infarction, intracranial hemorrhage, mass lesion, hydrocephalus, or extra-axial fluid.Moderate atrophy is present.  There is moderately advanced chronic microvascular ischemic change of the periventricular and subcortical white matter.  Calvarium is intact.  Carotid and vertebral atherosclerosis is noted.  There is no acute sinus or mastoid disease.  Compared with prior MR, the appearance is similar.  IMPRESSION: No acute intracranial ischemic or post traumatic findings.  Chronic changes as described.   Original Report Authenticated By: Davonna Belling, M.D.     Scheduled Meds:   . amLODipine  5 mg Oral Daily  . aspirin EC  81 mg Oral Daily  . enoxaparin (LOVENOX) injection  30 mg Subcutaneous Q24H  . sodium chloride  500 mL Intravenous Once   Continuous Infusions:   . sodium chloride      Principal Problem:  *Rhabdomyolysis Active Problems:  RENAL FAILURE  EDEMA, CHRONIC  Left leg weakness  Leukocytosis  Dehydration    Time spent: >30 minutes    [name]  [facility] Pager [phone]. If 8PM-8AM, please contact night-coverage at [email or web address], password [email or web address] 03/15/2012, 4:41 PM  LOS: 1 day

## 2012-03-16 DIAGNOSIS — R55 Syncope and collapse: Secondary | ICD-10-CM

## 2012-03-16 LAB — PROCALCITONIN: Procalcitonin: 0.22 ng/mL

## 2012-03-16 MED ORDER — MORPHINE SULFATE 2 MG/ML IJ SOLN: 1.0000 mg | INTRAMUSCULAR | Status: DC | PRN

## 2012-03-16 MED ORDER — HYDRALAZINE HCL 20 MG/ML IJ SOLN
10.0000 mg | Freq: Four times a day (QID) | INTRAMUSCULAR | Status: DC | PRN
  Administered 2012-03-16: 10 mg via INTRAVENOUS
  Filled 2012-03-16: qty 1

## 2012-03-16 MED ORDER — ACETAMINOPHEN 325 MG PO TABS
650.0000 mg | ORAL_TABLET | Freq: Four times a day (QID) | ORAL | Status: DC | PRN
  Administered 2012-03-19: 650 mg via ORAL

## 2012-03-16 MED ORDER — ISOSORB DINITRATE-HYDRALAZINE 20-37.5 MG PO TABS
1.0000 | ORAL_TABLET | Freq: Two times a day (BID) | ORAL | Status: DC
  Administered 2012-03-16 – 2012-03-19 (×6): 1 via ORAL
  Filled 2012-03-16 (×7): qty 1

## 2012-03-16 MED ORDER — SODIUM CHLORIDE 0.9 % IV SOLN
INTRAVENOUS | Status: DC
  Administered 2012-03-16: 16:00:00 via INTRAVENOUS

## 2012-03-16 NOTE — Progress Notes (Signed)
Physical Therapy Evaluation Patient Details Name: [name] MRN: [id number] DOB: 1926-04-09 Today's Date: 03/16/2012 Time: 6387-5643 PT Time Calculation (min): 17 min  PT Assessment / Plan / Recommendation Clinical Impression  Pt is 79 yo male who fell and remained down and now presents with rhabdomyolysis and resulting generalized weakness. Pt also presents with strong posterior lean and coordination deficits, question etiology? Fatigue? Recommend acute PT to address these issues as well as SNF placement for rehab.    PT Assessment  Patient needs continued PT services    Follow Up Recommendations  SNF    Does the patient have the potential to tolerate intense rehabilitation      Barriers to Discharge Decreased caregiver support      Equipment Recommendations  Rolling walker with 5" wheels    Recommendations for Other Services     Frequency Min 3X/week    Precautions / Restrictions Precautions Precautions: Fall Precaution Comments: pt reports that he has fallen several times at home but just has not gotten hurt Restrictions Weight Bearing Restrictions: No   Pertinent Vitals/Pain No c/o pain      Mobility  Bed Mobility Bed Mobility: Sit to Supine Sit to Supine: 4: Min assist;With rail Details for Bed Mobility Assistance: min A to get legs into bed' Transfers Transfers: Sit to Stand;Stand to Sit Sit to Stand: 3: Mod assist;From chair/3-in-1;With upper extremity assist Stand to Sit: 4: Min assist;To bed;With upper extremity assist Details for Transfer Assistance: pt had difficulty scooting to edge of chair, tacile cues to lean past center. Pt stood with trunk flexed but wt posterior with feet out in front of him. Able to slide feet back with support at trunk and verbal cues Ambulation/Gait Ambulation/Gait Assistance: 3: Mod assist Ambulation Distance (Feet): 2 Feet Assistive device: Rolling walker Ambulation/Gait Assistance Details: 2' from chair to bed, pt  needed mod A to remain upright, continued with strong posterior lean and coordination deficits evident,. Unsure if this is a result of extreme fatigue or if a new process is beginning.  Gait Pattern: Step-to pattern;Decreased stride length Stairs: No Wheelchair Mobility Wheelchair Mobility: No    Shoulder Instructions     Exercises General Exercises - Lower Extremity Ankle Circles/Pumps: AROM;Both;10 reps;Supine   PT Diagnosis: Difficulty walking;Abnormality of gait;Generalized weakness  PT Problem List: Decreased strength;Decreased activity tolerance;Decreased balance;Decreased mobility;Decreased coordination;Decreased knowledge of use of DME;Decreased knowledge of precautions PT Treatment Interventions: DME instruction;Gait training;Functional mobility training;Therapeutic activities;Therapeutic exercise;Balance training;Neuromuscular re-education;Patient/family education   PT Goals Acute Rehab PT Goals PT Goal Formulation: With patient Time For Goal Achievement: 03/30/12 Potential to Achieve Goals: Good Pt will go Supine/Side to Sit: with supervision PT Goal: Supine/Side to Sit - Progress: Goal set today Pt will go Sit to Supine/Side: with supervision PT Goal: Sit to Supine/Side - Progress: Goal set today Pt will go Sit to Stand: with supervision PT Goal: Sit to Stand - Progress: Goal set today Pt will go Stand to Sit: with supervision PT Goal: Stand to Sit - Progress: Goal set today Pt will Transfer Bed to Chair/Chair to Bed: with supervision PT Transfer Goal: Bed to Chair/Chair to Bed - Progress: Goal set today Pt will Ambulate: 51 - 150 feet;with supervision;with rolling walker PT Goal: Ambulate - Progress: Goal set today  Visit Information  Last PT Received On: 03/16/12 Assistance Needed: +2 (for ambulation)    Subjective Data  Subjective: I know I will need rehab before I go home Patient Stated Goal: return home eventually   Prior  Functioning  Home Living Lives  With: Alone Available Help at Discharge: Family;Available 24 hours/day Type of Home: Apartment Home Access: Level entry Home Layout: One level Bathroom Shower/Tub: Tub/shower unit;Curtain Firefighter: Standard Bathroom Accessibility: Yes How Accessible: Accessible via walker Home Adaptive Equipment: None Additional Comments: pt reports his urologist was about to get him a cane Prior Function Level of Independence: Independent Able to Take Stairs?: Yes Driving: Yes 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 no family present    Extremity/Trunk Assessment Right Upper Extremity Assessment RUE ROM/Strength/Tone: Within functional levels RUE Sensation: WFL - Light Touch;WFL - Proprioception RUE Coordination: WFL - gross/fine motor Left Upper Extremity Assessment LUE ROM/Strength/Tone: Deficits (@ 3+/5 throughout) LUE ROM/Strength/Tone Deficits: general weakness LUE Sensation: WFL - Light Touch;WFL - Proprioception LUE Coordination: WFL - fine motor Right Lower Extremity Assessment RLE ROM/Strength/Tone: Deficits RLE ROM/Strength/Tone Deficits: pt has difficulty getting legs into bed and reports that he feels full body tired. Strength grossly 2+/5 functionally throughout RLE Sensation: WFL - Light Touch RLE Coordination: Deficits RLE Coordination Deficits: pt has difficulty coordinating LE mvmts for sit to stand and ambulation Left Lower Extremity Assessment LLE ROM/Strength/Tone: Deficits LLE ROM/Strength/Tone Deficits: same as RLE and pt reports that this leg "gives out" LLE Sensation: WFL - Light Touch LLE Coordination: Deficits LLE  Coordination Deficits: same as RLE Trunk Assessment Trunk Assessment: Other exceptions Trunk Exceptions: left lateral lean in sitting, not as noticeable in standing, posterior lean stronger in standing   Balance Balance Balance Assessed: Yes Static Sitting Balance Static Sitting - Balance Support: No upper extremity supported;Feet supported Static Sitting - Level of Assistance: 6: Modified independent (Device/Increase time) Static Sitting - Comment/# of Minutes: 15 Static Standing Balance Static Standing - Balance Support: Bilateral upper extremity supported;During functional activity Static Standing - Level of Assistance: 3: Mod assist Static Standing - Comment/# of Minutes: 4  End of Session PT - End of Session Equipment Utilized During Treatment: Gait belt Activity Tolerance: Patient limited by fatigue Patient left: in bed;with call bell/phone within reach Nurse Communication: Mobility status  GP   Lyanne Co, PT  Acute Rehab Services  [phone]    Lyanne Co 03/16/2012, 5:07 PM

## 2012-03-16 NOTE — Progress Notes (Signed)
Occupational Therapy Evaluation Patient Details Name: [name] MRN: [id number] DOB: 1926-06-05 Today's Date: 03/16/2012 Time: 0865-7846 OT Time Calculation (min): 25 min  OT Assessment / Plan / Recommendation Clinical Impression  79 yo with unwitnessed syncopal episode at home. Pt with Rhabo, renal failure, chronic edema, leukocytosis, dehydration. PTA, pt lived alone with intermittent S of family. Pt will need rehab at SNF. Pt/family may want to consider ALF as option for long term planning. Pt will benefit from skilled OT serivces to max independence with ADL and functional mobility for ADL to facilitate D/C to SNF. Pt in agreeement .    OT Assessment  Patient needs continued OT Services    Follow Up Recommendations  SNF    Barriers to Discharge None (SNF)    Equipment Recommendations  None recommended by OT    Recommendations for Other Services    Frequency  Min 2X/week    Precautions / Restrictions Precautions Precautions: Fall   Pertinent Vitals/Pain No pain.  Orthostatic BPs  Supine   Sitting 170/85  Sitting after min   Standing   Standing after 1 min 202/91  nsg notified     ADL  Eating/Feeding: Independent Where Assessed - Eating/Feeding: Chair Grooming: Supervision/safety;Set up Where Assessed - Grooming: Unsupported sitting Upper Body Bathing: Supervision/safety;Set up Where Assessed - Upper Body Bathing: Unsupported sitting Lower Body Bathing: Moderate assistance Where Assessed - Lower Body Bathing: Supported sit to stand Upper Body Dressing: Supervision/safety;Set up Where Assessed - Upper Body Dressing: Unsupported sitting Lower Body Dressing: Maximal assistance Where Assessed - Lower Body Dressing: Supported sit to stand Toilet Transfer: Moderate assistance (post lean) Toilet Transfer Method: Sit to stand;Stand pivot Toilet Transfer Equipment: Other (comment) (bed - chair) Toileting - Clothing Manipulation and Hygiene: Simulated;Maximal  assistance Where Assessed - Toileting Clothing Manipulation and Hygiene: Sit to stand from 3-in-1 or toilet;Standing Tub/Shower Transfer: Maximal assistance Equipment Used: Gait belt Transfers/Ambulation Related to ADLs: Able to scoot forward in recliner. Mod A sit - stand. Posterior lean ADL Comments: limited by poor endurance and balance deficits. would benefit from AE    OT Diagnosis: Generalized weakness;Other (comment) (balance deficits)  OT Problem List: Decreased strength;Decreased range of motion;Decreased activity tolerance;Impaired balance (sitting and/or standing);Decreased safety awareness;Decreased knowledge of use of DME or AE;Decreased knowledge of precautions;Cardiopulmonary status limiting activity;Increased edema OT Treatment Interventions: Self-care/ADL training;Therapeutic exercise;Energy conservation;DME and/or AE instruction;Therapeutic activities;Patient/family education;Balance training   OT Goals Acute Rehab OT Goals OT Goal Formulation: With patient Time For Goal Achievement: 03/30/12 Potential to Achieve Goals: Good ADL Goals Pt Will Perform Lower Body Bathing: with min assist;Supported;Sit to stand from chair;with cueing (comment type and amount) (vc) ADL Goal: Lower Body Bathing - Progress: Goal set today Pt Will Perform Lower Body Dressing: with min assist;Supported;with adaptive equipment;with cueing (comment type and amount) (vc) ADL Goal: Lower Body Dressing - Progress: Goal set today Pt Will Transfer to Toilet: with min assist;with DME;3-in-1;Ambulation ADL Goal: Toilet Transfer - Progress: Goal set today Pt Will Perform Toileting - Clothing Manipulation: with min assist;Standing;with cueing (comment type and amount) ADL Goal: Toileting - Clothing Manipulation - Progress: Goal set today Pt Will Perform Toileting - Hygiene: with supervision;Sit to stand from 3-in-1/toilet ADL Goal: Toileting - Hygiene - Progress: Goal set today Additional ADL Goal #1:  Complete functional mobility within room @ RW level with min A to increase independence with ADL ADL Goal: Additional Goal #1 - Progress: Goal set today Arm Goals Pt Will Complete Theraband Exer: with supervision, verbal cues  required/provided;to increase strength;Bilateral upper extremities;1 set;Level 2 Theraband Arm Goal: Theraband Exercises - Progress: Goal set today  Visit Information  Last OT Received On: 03/16/12 Reason Eval/Treat Not Completed: Patient at procedure or test/ unavailable (vascular)    Subjective Data   I've had several falls   Prior Functioning     Home Living Lives With: Alone Available Help at Discharge: Family;Available 24 hours/day Type of Home: Apartment Home Access: Level entry Home Layout: One level Bathroom Shower/Tub: Tub/shower unit;Curtain Firefighter: Standard Bathroom Accessibility: Yes How Accessible: Accessible via walker Home Adaptive Equipment: None Prior Function Level of Independence: Independent Able to Take Stairs?: Yes Driving: Yes 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 no family present    Extremity/Trunk Assessment Right Upper Extremity Assessment RUE ROM/Strength/Tone: Within functional levels RUE Sensation: WFL - Light Touch;WFL - Proprioception RUE Coordination: WFL - gross/fine motor Left Upper Extremity Assessment LUE ROM/Strength/Tone: Deficits (@ 3+/5 throughout) LUE ROM/Strength/Tone Deficits: general weakness LUE Sensation: WFL - Light Touch;WFL -  Proprioception LUE Coordination: WFL - fine motor Right Lower Extremity Assessment RLE ROM/Strength/Tone: WFL for tasks assessed Left Lower Extremity Assessment LLE ROM/Strength/Tone: Deficits (general weakness. ) LLE ROM/Strength/Tone Deficits: pt reports LLE "giving out "  LLE Sensation: WFL - Light Touch;WFL - Proprioception LLE Coordination: Deficits Trunk Assessment Trunk Assessment: Other exceptions (L lat  lean at rest. ? L sided weakness)     Mobility Bed Mobility Bed Mobility: Not assessed Transfers Transfers: Sit to Stand;Stand to Sit Sit to Stand: 3: Mod assist;With upper extremity assist;From chair/3-in-1 Stand to Sit: 3: Mod assist;With upper extremity assist;To chair/3-in-1 Details for Transfer Assistance: posterior lean. Able to translate anteriorly with tactile cues     Shoulder Instructions     Exercise     Balance Balance Balance Assessed: Yes Static Sitting Balance Static Sitting - Balance Support: No upper extremity supported;Feet supported Static Sitting - Level of Assistance: 6: Modified independent (Device/Increase time) Static Sitting - Comment/# of Minutes: 15 Static Standing Balance Static Standing - Balance Support: Bilateral upper extremity supported;During functional activity Static Standing - Level of Assistance: 3: Mod assist Static Standing - Comment/# of Minutes: 5   End of Session OT - End of Session Equipment Utilized During Treatment: Gait belt Activity Tolerance: Patient tolerated treatment well Patient left: in chair;with call bell/phone within reach Nurse Communication: Mobility status;Other (comment) (BP. see vitals)  GO     Lylla Eifler,HILLARY 03/16/2012, 3:53 PM [facility], OTR/L  [phone] 03/16/2012

## 2012-03-16 NOTE — Progress Notes (Signed)
2D Echo performed  03/16/2012  

## 2012-03-16 NOTE — Progress Notes (Signed)
[facility] PROGRESS NOTE  [name] ZOX:[id number] DOB: 08-31-1926 DOA: 03/14/2012 PCP: Dillon Hughs, MD  Assessment/Plan: 1-Syncope/collapse: continue telemetry evaluation. -No orthostatic -CT no acute abnormalities -Follow 2-D echo  -carotid dopplers: w/o ICA stenosis. Vertebral artery flow is antegrade. -IVF's changed now to [facility]; continue supportive care -PT/OT recommending SNF  2-Rhabdomyolysis: continue IVF. CK levels trending down.  3-Acute on chronic renal failure: due to continue use of diuretics and rhabdomyolysis. Will continue holding diuretics and continue IVF's. BMET in am to follow Cr.  4-HTN: BP rising and uncontrolled; will continue amlodipine 5 mg daily and add bidil BID for better control.  5-Hx of MI: no CP, EKG and troponin w/o acute ischemic changes. Continue ASA. @-D echo done but results pending will follow.  6-Leukocytosis: improving after fluids. No signs or source of infection identified. Most likely demargination. Will discontinue zosyn. Follow WBC trend.  DVT: Lovenox  Code Status: Full Family Communication: no family at bedside Disposition Plan: will need SNF at discharge; will ask SW assistance with placement.   Consultants:  PT/OT  Procedures:  2-d echo  Pending  carotid doppler: There is no ICA stenosis. Vertebral artery flow is antegrade.  CT head neg for acute abnormality  CXR 2 views no acute cardiopulmonary process.  Antibiotics:  Zosyn for 2 doses after admission for concerns of aspiration.  HPI/Subjective: Afebrile; feeling better. AAOX3. Denies CP or SOB. Patient is just feeling weak. No abnormalities on Telemetry and normal carotid doppler   Objective: Filed Vitals:   03/16/12 0920 03/16/12 1200 03/16/12 1500 03/16/12 1523  BP: 145/63 154/79 170/85 202/91  Pulse:  70    Temp:  98.8 F (37.1 C)    TempSrc:      Resp:  18    Height:      Weight:      SpO2:  95%      Intake/Output Summary (Last 24 hours)  at 03/16/12 1600 Last data filed at 03/16/12 1100  Gross per 24 hour  Intake    660 ml  Output   1350 ml  Net   -690 ml   Filed Weights   03/14/12 2300 03/14/12 2330  Weight: 88.451 kg (195 lb) 85.7 kg (188 lb 15 oz)    Exam:   General:  NAD; denies SOB, weak   Cardiovascular: S1 and S2; positive SEM, no rubs, no gallops  Respiratory: CTA bilaterally  Abdomen: soft, NT, no distended, positive BS  Neuro: non focal  Data Reviewed: Basic Metabolic Panel:  Lab 03/15/12 8119 03/14/12 1748  NA 145 145  K 3.6 3.7  CL 107 104  CO2 26 26  GLUCOSE 93 115*  BUN 55* 54*  CREATININE 2.34* 2.34*  CALCIUM 8.7 9.3  MG -- --  PHOS -- --   Liver Function Tests:  Lab 03/14/12 1748  AST 378*  ALT 114*  ALKPHOS 61  BILITOT 0.9  PROT 7.1  ALBUMIN 3.7   CBC:  Lab 03/15/12 0550 03/14/12 1748  WBC 15.0* 19.7*  NEUTROABS -- --  HGB 8.6* 9.8*  HCT 26.3* 29.7*  MCV 96.3 96.4  PLT 167 179   Cardiac Enzymes:  Lab 03/15/12 1739 03/15/12 1042 03/15/12 0550 03/14/12 2233 03/14/12 1748  CKTOTAL 7844* 9567* 10118* 13199* 16825*  CKMB 18.9* 33.9* 43.5* 89.3* 137.2*  CKMBINDEX -- -- -- -- --  TROPONINI -- -- -- -- --   CBG:  Lab 03/14/12 1744  GLUCAP 89    Studies: Dg Chest 2 View  03/15/2012  *  RADIOLOGY REPORT*  Clinical Data: Rule out pneumonia  CHEST - 2 VIEW  Comparison: Prior chest x-ray 03/14/2012  Findings: No significant interval change in the appearance of the chest compared to yesterday.  No developing airspace consolidation to suggest pneumonia.  Unchanged cardiomegaly.  No acute osseous abnormality.  No pleural effusion, pneumothorax or pulmonary edema.  IMPRESSION: No acute cardiopulmonary disease and no significant interval change compared to yesterday's chest x-ray.   Original Report Authenticated By: Malachy Moan, M.D.    Dg Chest 2 View  03/14/2012  *RADIOLOGY REPORT*  Clinical Data: Altered mental status.  Weakness in the left leg.  CHEST - 2 VIEW   Comparison: 03/04/2012  Findings: Cardiomegaly.  Calcified tortuous aorta.  Clear lung fields.  No bony abnormality.  IMPRESSION: No acute disease.   Original Report Authenticated By: Davonna Belling, M.D.    Ct Head Wo Contrast  03/14/2012  *RADIOLOGY REPORT*  Clinical Data: Weakness in left leg.  Found on floor in his apartment.  CT HEAD WITHOUT CONTRAST  Technique:  Contiguous axial images were obtained from the base of the skull through the vertex without contrast.  Comparison: MRI brain 10/23/2011.  Findings:  There is no evidence for acute infarction, intracranial hemorrhage, mass lesion, hydrocephalus, or extra-axial fluid.Moderate atrophy is present.  There is moderately advanced chronic microvascular ischemic change of the periventricular and subcortical white matter.  Calvarium is intact.  Carotid and vertebral atherosclerosis is noted.  There is no acute sinus or mastoid disease.  Compared with prior MR, the appearance is similar.  IMPRESSION: No acute intracranial ischemic or post traumatic findings.  Chronic changes as described.   Original Report Authenticated By: Davonna Belling, M.D.     Scheduled Meds:    . amLODipine  5 mg Oral Daily  . aspirin EC  81 mg Oral Daily  . enoxaparin (LOVENOX) injection  30 mg Subcutaneous Q24H  . isosorbide-hydrALAZINE  1 tablet Oral BID  . sodium chloride  500 mL Intravenous Once   Continuous Infusions:    . sodium chloride      Time spent: >30 minutes    [name]  [facility] Pager [phone]. If 8PM-8AM, please contact night-coverage at [email or web address], password [facility] 03/16/2012, 4:00 PM  LOS: 2 days

## 2012-03-16 NOTE — Progress Notes (Signed)
OT Cancellation Note  Patient Details Name: [name] MRN: [id number] DOB: 1927/02/05   Cancelled Treatment:    Reason Eval/Treat Not Completed: Patient at procedure or test/ unavailable (vascular)  [name] 03/16/2012, 12:16 PM

## 2012-03-16 NOTE — Progress Notes (Signed)
VASCULAR LAB PRELIMINARY  PRELIMINARY  PRELIMINARY  PRELIMINARY  Carotid Dopplers completed.    Preliminary report:  There is no ICA stenosis.  Vertebral artery flow is antegrade.  Moncerrath Berhe, RVT 03/16/2012, 11:07 AM

## 2012-03-17 DIAGNOSIS — D649 Anemia, unspecified: Secondary | ICD-10-CM

## 2012-03-17 LAB — BASIC METABOLIC PANEL
Calcium: 8.4 mg/dL (ref 8.4–10.5)
Chloride: 112 mEq/L (ref 96–112)
Creatinine, Ser: 1.7 mg/dL — ABNORMAL HIGH (ref 0.50–1.35)
GFR calc Af Amer: 41 mL/min — ABNORMAL LOW (ref >90)
Sodium: 149 mEq/L — ABNORMAL HIGH (ref 135–145)

## 2012-03-17 LAB — CBC
Hemoglobin: 6.8 g/dL — CL (ref 13.0–17.0)
MCHC: 32.1 g/dL (ref 30.0–36.0)
RDW: 14.9 % (ref 11.5–15.5)
WBC: 12.1 10*3/uL — ABNORMAL HIGH (ref 4.0–10.5)

## 2012-03-17 LAB — FERRITIN: Ferritin: 145 ng/mL (ref 22–322)

## 2012-03-17 LAB — ABO/RH: ABO/RH(D): O POS

## 2012-03-17 LAB — LACTIC ACID, PLASMA: Lactic Acid, Venous: 1.2 mmol/L (ref 0.5–2.2)

## 2012-03-17 LAB — IRON AND TIBC: Iron: 49 ug/dL (ref 42–135)

## 2012-03-17 MED ORDER — ENOXAPARIN SODIUM 30 MG/0.3ML ~~LOC~~ SOLN: 30.0000 mg | SUBCUTANEOUS | Status: DC

## 2012-03-17 MED ORDER — DOCUSATE SODIUM 100 MG PO CAPS
100.0000 mg | ORAL_CAPSULE | Freq: Two times a day (BID) | ORAL | Status: DC
  Administered 2012-03-17 – 2012-03-19 (×4): 100 mg via ORAL
  Filled 2012-03-17 (×5): qty 1

## 2012-03-17 MED ORDER — POLYETHYLENE GLYCOL 3350 17 G PO PACK
17.0000 g | PACK | Freq: Every day | ORAL | Status: DC
  Administered 2012-03-17 – 2012-03-19 (×3): 17 g via ORAL
  Filled 2012-03-17 (×3): qty 1

## 2012-03-17 MED ORDER — FUROSEMIDE 10 MG/ML IJ SOLN
40.0000 mg | Freq: Once | INTRAMUSCULAR | Status: AC
  Administered 2012-03-17: 40 mg via INTRAVENOUS
  Filled 2012-03-17: qty 4

## 2012-03-17 NOTE — Clinical Social Work Placement (Addendum)
    Clinical Social Work Department CLINICAL SOCIAL WORK PLACEMENT NOTE 03/17/2012  Patient:  [name], [name]  Account Number:  [id number] Admit date:  03/14/2012  Clinical Social Worker:  Margaree Mackintosh  Date/time:  03/17/2012 11:53 AM  Clinical Social Work is seeking post-discharge placement for this patient at the following level of care:   SKILLED NURSING   (*CSW will update this form in Epic as items are completed)   03/17/2012  Patient/family provided with [facility] Department of Clinical Social Work's list of facilities offering this level of care within the geographic area requested by the patient (or if unable, by the patient's family).  03/17/2012  Patient/family informed of their freedom to choose among providers that offer the needed level of care, that participate in Medicare, Medicaid or managed care program needed by the patient, have an available bed and are willing to accept the patient.  03/17/2012  Patient/family informed of [facility]' ownership interest in [facility], as well as of the fact that they are under no obligation to receive care at this facility.  PASARR submitted to EDS on 03/17/2012 PASARR number received from EDS on 03/17/2012  FL2 transmitted to all facilities in geographic area requested by pt/family on  03/17/2012 FL2 transmitted to all facilities within larger geographic area on   Patient informed that his/her managed care company has contracts with or will negotiate with  certain facilities, including the following:     Patient/family informed of bed offers received:  [facility] Patient chooses bed at [facility] Physician recommends and patient chooses bed at  [facility]  Patient to be transferred to  on   Patient to be transferred to facility by   The following physician request were entered in Epic:   Additional Comments:

## 2012-03-17 NOTE — Progress Notes (Addendum)
[facility] PROGRESS NOTE  [name] ZOX:[id number] DOB: 1927/01/21 DOA: 03/14/2012 PCP: Sandrea Hughs, MD  Assessment/Plan: 1-Syncope/collapse: continue telemetry evaluation. -No orthostatic changes appreciated after fluid resuscitation; in fact BP high -CT head no acute abnormalities -2-D echo demonstrating only grade 1 diastolic dysfunction; but no wall motion abnormalities and preserved EF. -carotid dopplers: w/o ICA stenosis. Vertebral artery flow is antegrade. -IVF's changed to [facility]; continue supportive care -PT/OT recommending SNF  2-Rhabdomyolysis: continue encouraging good hydration. CK levels trending down.  3-Acute on chronic renal failure: due to continue use of diuretics and rhabdomyolysis. Will continue holding diuretics and continue IVF's. Cr 1.7 now; pretty much back to baseline. Will monitor  4-HTN: BP better. Will continue bidil and amlodipine  5-Hx of MI: no CP, EKG and troponin w/o acute ischemic changes. Continue ASA. 2-D echo done and showing no wall motion abnormalities and preserved EF.  6-Leukocytosis: improving after fluids. WBC today 12.2. Will monitor. Most likely demargination.   7-physical deconditioning: per evaluation with PT/OT will require SNF for rehabilitation. SW helping with discharge.  8-anemia: patient with per PCP records of diverticulosis, but no reports on EPIC. Hgb 6.8 this morning in part due to hemodilution but he was so dehydrated on admission that admission labs were no reliable either. FOBT not obtained yet as patient constipated. Will transfuse 2 units as his syncope could be associated with orthostasis due to anemia. GI consulted for assistance in the workup of his anemia. Will discontinue heparin products for DVT  prophylaxis  9-constipation: started on colace and miralax.  DVT: scd's  Code Status: Full Family Communication: no family at bedside Disposition Plan: will need SNF at discharge; will ask SW assistance with  placement.   Consultants:  PT/OT  Procedures:  2-d echo :demonstrating preserved EF, grade diastolic dysfunction and no wall motion abnormalities.  carotid doppler: There is no ICA stenosis. Vertebral artery flow is antegrade.  CT head neg for acute abnormality  CXR 2 views no acute cardiopulmonary process.  Antibiotics:  Zosyn for 2 doses after admission for concerns of aspiration.  HPI/Subjective: Afebrile; feeling better. AAOX3. Denies CP or SOB. Patient continue feeling really weak and was slightly somnolent today. Hgb this morning critical value at 6.8  Objective: Filed Vitals:   03/17/12 1415 03/17/12 1422 03/17/12 1641 03/17/12 1707  BP: 158/78 157/77 162/76 162/89  Pulse: 78 80 75 88  Temp: 98.8 F (37.1 C) 98.9 F (37.2 C) 98.6 F (37 C) 98.8 F (37.1 C)  TempSrc: Oral Oral Oral Oral  Resp: 16 18 18 20   Height:      Weight:      SpO2:   100%     Intake/Output Summary (Last 24 hours) at 03/17/12 1818 Last data filed at 03/17/12 1707  Gross per 24 hour  Intake   1122 ml  Output   1900 ml  Net   -778 ml   Filed Weights   03/14/12 2300 03/14/12 2330 03/17/12 0000  Weight: 88.451 kg (195 lb) 85.7 kg (188 lb 15 oz) 89.5 kg (197 lb 5 oz)    Exam:   General:  NAD; denies SOB, feeling weak   Cardiovascular: S1 and S2; positive SEM, no rubs, no gallops  Respiratory: CTA bilaterally  Abdomen: soft, NT, mild distended, positive BS  Neuro: non focal  Data Reviewed: Basic Metabolic Panel:  Lab 03/17/12 8119 03/15/12 0550 03/14/12 1748  NA 149* 145 145  K 3.4* 3.6 3.7  CL 112 107 104  CO2 28 26  26  GLUCOSE 116* 93 115*  BUN 43* 55* 54*  CREATININE 1.70* 2.34* 2.34*  CALCIUM 8.4 8.7 9.3  MG -- -- --  PHOS -- -- --   Liver Function Tests:  Lab 03/14/12 1748  AST 378*  ALT 114*  ALKPHOS 61  BILITOT 0.9  PROT 7.1  ALBUMIN 3.7   CBC:  Lab 03/17/12 0540 03/15/12 0550 03/14/12 1748  WBC 12.1* 15.0* 19.7*  NEUTROABS -- -- --  HGB 6.8*  8.6* 9.8*  HCT 21.2* 26.3* 29.7*  MCV 98.1 96.3 96.4  PLT 133* 167 179   Cardiac Enzymes:  Lab 03/15/12 1739 03/15/12 1042 03/15/12 0550 03/14/12 2233 03/14/12 1748  CKTOTAL 7844* 9567* 10118* 13199* 16825*  CKMB 18.9* 33.9* 43.5* 89.3* 137.2*  CKMBINDEX -- -- -- -- --  TROPONINI -- -- -- -- --   CBG:  Lab 03/14/12 1744  GLUCAP 89    Studies: No results found.  Scheduled Meds:    . amLODipine  5 mg Oral Daily  . aspirin EC  81 mg Oral Daily  . docusate sodium  100 mg Oral BID  . isosorbide-hydrALAZINE  1 tablet Oral BID  . polyethylene glycol  17 g Oral Daily  . sodium chloride  500 mL Intravenous Once   Continuous Infusions:    . sodium chloride 20 mL/hr at 03/16/12 1609    Time spent: >30 minutes    Jendaya Gossett  [facility] Pager [phone]. If 8PM-8AM, please contact night-coverage at [email or web address], password [facility] 03/17/2012, 6:18 PM  LOS: 3 days

## 2012-03-17 NOTE — Progress Notes (Signed)
CRITICAL VALUE ALERT  Critical value received:  HMG 6.8 Date of notification:  03/17/12  Time of notification:  0630  Critical value read back: YES  Nurse who received alert:  Mellody Dance MD notified (1st page): Maren Reamer  Time of first page:  [phone]  MD notified (2nd page):  Time of second page:  Responding MD:  Maren Reamer  Time MD responded:  [phone]

## 2012-03-17 NOTE — Clinical Social Work Psychosocial (Signed)
     Clinical Social Work Department BRIEF PSYCHOSOCIAL ASSESSMENT 03/17/2012  Patient:  [name]     Account Number:  [id number]     Admit date:  03/14/2012  Clinical Social Worker:  Margaree Mackintosh  Date/Time:  03/17/2012 12:00 M  Referred by:  Physician  Date Referred:  03/17/2012 Referred for  SNF Placement   Other Referral:   Interview type:  Patient Other interview type:    PSYCHOSOCIAL DATA Living Status:  ALONE Admitted from facility:   Level of care:   Primary support name:  Myrtie Neither: [phone] Primary support relationship to patient:  SIBLING Degree of support available:   Unknown.    CURRENT CONCERNS Current Concerns  Post-Acute Placement   Other Concerns:    SOCIAL WORK ASSESSMENT / PLAN Clinical Social Worker recieved referral indicating pt may benefit from ST-SNF at dc to assist with ambulation and level of independence.  CSW reviewed chart and met with pt at bedside.  CSW introduced self, explained role, and provided support.  CSW reviewed PT recommendations for ST-SNF; pt agreeable.  CSW reviewed SNF process and provided opportunity for all quesitons/concerns to be addressed. CSW to begin SNF search.   Assessment/plan status:  Information/Referral to [facility] Other assessment/ plan:   Information/referral to community resources:   SNF.    PATIENTS/FAMILYS RESPONSE TO PLAN OF CARE: Pt was pleasant and engaged in conversation.  Pt thanked CSW for intervention.

## 2012-03-17 NOTE — Consult Note (Signed)
[facility] Consult: 11:04 AM 03/18/2012   Referring Provider: Dr Alferd Patee  Primary Care Physician:  Sandrea Hughs, MD Primary Gastroenterologist:   Sheryn Bison   Reason for Consultation:  Anemia.   HPI: [name] is a 79 y.o. male.  Hx prostate cancer (no radiation) controlled hypertension, mild renal insufficiency, leg edema. Rectal bleeding in 03/2008, presumed to be diverticular.  Did not undergo colonoscopy.  Colonoscopies in 2002 and 2008 both showed diverticulosis, the earlier study notes hemorrhoids.   Had taken po Iron until told to stop this by urologist about one week ago.    Admitted after found down at home, labs showed rhabdomyolisis.  He fell due to weakness of left leg (chronic issue) and was down about 12 hours, did not have syncope.  Says he is normally not that week as he is now.   Hgb went from 9.8 (MCV 97) on admission to 6.8 3 days later on 1/21.  Got 2 units of PRBCs and Hgb up to 8.2 today.   FOB test on 7/18 and again today is negative.   No hx tarry or bloody stools.  Normally daily BMs at home, less frequent stools here in hospital.  Had been dark when taking Iron.  No nausea, anorexia, dysphagia, heartburn, NSAIDs, No recall of or records of prior EGD.  ROS No  excessive ETOH.  No chest pain, no PND, no palpitations, no chest pain.   Thinks he has had transfusions before yesterday Has urinary urgency and incontinence if does not void in timely manner.  No weight loss. Flu and pneumovax up to date.  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  No recent gout flares  No unusual bleeding or bruising including no epistaxis. No rash, sores, itching   Past Medical History  Diagnosis Date  . Diverticulosis     divertic bleed presmed in  2010  . Gout   . Leg swelling     left  . Hyperlipidemia   . History of myocardial infarction   . Olecranon bursitis   . Renal failure   . HTN (hypertension)     . Prostate cancer   . Anemia   . Healthcare maintenance     Past Surgical History  Procedure Date  . R ia aneurysmectomy     scar is in RLQ  . Prostate cryoablation 2006  . Suprapubic catheter placement 2006    Prior to Admission medications   Medication Sig Start Date End Date Taking? Authorizing Provider  acetaminophen (TYLENOL) 325 MG tablet Take 650 mg by mouth 2 (two) times daily as needed. For pain   Yes Historical Provider, MD  allopurinol (ZYLOPRIM) 300 MG tablet Take 300 mg by mouth daily.   Yes Historical Provider, MD  aspirin EC 81 MG tablet Take 81 mg by mouth daily.   Yes Historical Provider, MD  atenolol (TENORMIN) 100 MG tablet Take 50 mg by mouth daily.   Yes Historical Provider, MD  cloNIDine (CATAPRES) 0.2 MG tablet Take 0.1-0.2 mg by mouth 2 (two) times daily. 0.1 mg  (1/2 tablet) every morning and 0.1 mg (1 tablet) at bedtime   Yes Historical Provider, MD  colchicine 0.6 MG tablet Take 0.6 mg by mouth every 6 (six) hours as needed. For gout   Yes Historical Provider, MD  furosemide (LASIX) 80 MG tablet Take 80 mg by mouth 2 (two) times daily.   Yes Historical Provider, MD  minoxidil (LONITEN) 10 MG tablet Take 10 mg by mouth 2 (  two) times daily.   Yes Historical Provider, MD  Multiple Vitamin (MULTIVITAMIN WITH MINERALS) TABS Take 1 tablet by mouth daily. Centrum silver   Yes Historical Provider, MD  Multiple Vitamins-Minerals (PROTEGRA PO) Take 1 capsule by mouth daily.     Yes Historical Provider, MD    Scheduled Meds:    . amLODipine  5 mg Oral Daily  . docusate sodium  100 mg Oral BID  . isosorbide-hydrALAZINE  1 tablet Oral BID  . polyethylene glycol  17 g Oral Daily  . sodium chloride  500 mL Intravenous Once   Infusions:    . sodium chloride 20 mL/hr at 03/16/12 1609   PRN Meds: acetaminophen, hydrALAZINE, morphine injection   Allergies as of 03/14/2012  . (No Known Allergies)    Family History  Problem Relation Age of Onset  .  Hypertension Brother   . Pancreatic cancer Brother   . Stroke Father   . Stroke Mother     History   Social History  . Marital Status: Single                 Occupational History  . retired    Social History Main Topics  . Smoking status: Never Smoker   . Smokeless tobacco: Never Used  . Alcohol Use: No    Social History Narrative   Stays busy with church activities, no aerobics     PHYSICAL EXAM: Vital signs in last 24 hours: Temp:  [97.5 F (36.4 C)-100.6 F (38.1 C)] 99 F (37.2 C) (01/22 0800) Pulse Rate:  [72-90] 72  (01/22 0800) Resp:  [16-20] 20  (01/22 0800) BP: (114-164)/(7-89) 139/72 mmHg (01/22 1010) SpO2:  [94 %-100 %] 97 % (01/22 0800)  General: aged but not acutely ill.  He is weak. Head:  No signs of trauma  Eyes:  No icterus , no conj pallor.  EOMI Ears:  Slightly HOH  Nose:  No discharge Mouth:  Clear and moist, pink MM. Neck:  No JVD or masses Lungs:  Clear B.  No resp distress Heart: RRR.  Slight 1/6 SM.   Abdomen:  Soft, NT, ND.  Old scar in RLQ from aneurysm repair.  Ventral, incisional and non-reduced left inguinal hernias  Rectal: no masses, stool brown and FOB negative.    GU:  Condom cath in place Musc/Skeltl: no joint swelling or deformity Extremities:  LE woody edema.  No sores  Neurologic:  Oriented x3.  A little bit slow response time but accurate.  No tremor or asterixis.  Skin:  Woody changes in LR Tattoos:  none Nodes:  No inguinal adenopathy   Psych:  Pleasant, relaxed.    Intake/Output from previous day: 01/21 0701 - 01/22 0700 In: 1362 [P.O.:1000; Blood:362] Out: 1800 [Urine:1800] Intake/Output this shift: Total I/O In: -  Out: 2500 [Urine:2500]  LAB RESULTS:  TSH  3.7  03/04/12  Basename 03/18/12 0905 03/17/12 0540  WBC 12.1* 12.1*  HGB 8.2* 6.8*  HCT 25.2* 21.2*  PLT 138* 133*   BMET Lab Results  Component Value Date   NA 149* 03/17/2012   NA 145 03/15/2012   NA 145 03/14/2012   K 3.4* 03/17/2012   K  3.6 03/15/2012   K 3.7 03/14/2012   CL 112 03/17/2012   CL 107 03/15/2012   CL 104 03/14/2012   CO2 28 03/17/2012   CO2 26 03/15/2012   CO2 26 03/14/2012   GLUCOSE 116* 03/17/2012   GLUCOSE 93 03/15/2012   GLUCOSE 115*  03/14/2012   BUN 43* 03/17/2012   BUN 55* 03/15/2012   BUN 54* 03/14/2012   CREATININE 1.70* 03/17/2012   CREATININE 2.34* 03/15/2012   CREATININE 2.34* 03/14/2012   CALCIUM 8.4 03/17/2012   CALCIUM 8.7 03/15/2012   CALCIUM 9.3 03/14/2012   LFT Hepatic Function Panel     Component Value Date/Time   PROT 7.1 03/14/2012 1748   ALBUMIN 3.7 03/14/2012 1748   AST 378* 03/14/2012 1748   ALT 114* 03/14/2012 1748   ALKPHOS 61 03/14/2012 1748   BILITOT 0.9 03/14/2012 1748   BILIDIR 0.2 03/04/2012 0959     Cardiac Panel (last 3 results)  Basename 03/15/12 1739  CKTOTAL 7844*  CKMB 18.9*  TROPONINI --  RELINDX 0.2   PT/INR Lab Results  Component Value Date   INR 1.1 03/15/2008     Ref. Range 03/17/2012 09:45  Iron Latest Range: 42-135 ug/dL 49  UIBC Latest Range: 125-400 ug/dL 161  TIBC Latest Range: 215-435 ug/dL 096 (L)  Saturation Ratios Latest Range: 20-55 % 25  Ferritin Latest Range: 22-322 ng/mL 145     RADIOLOGY STUDIES: Head CT with atrophy, no acute findings on 03/14/2012  ECHOCARDIOGRAM 03/16/12 Impressions: - The right ventricular systolic pressure was increased consistent with moderate pulmonary hypertension.   ENDOSCOPIC STUDIES: 2008  Colonoscopy  Dr Jarold Motto for routine screening Left sided tics, extensive.   2002 Colonoscopy  For routine screening Internal rrhoids and left sided tics.   IMPRESSION: *  Acute rhabdomyolisis in pt found down at home after unable to get up after a fall.  No syncope involved.  *  Acute anemia, normocytic 3 gram drop in hgb from 1/18 to 03/18/11.  Improved after transfusions.  Stool FOB negative twice.  Some of the drop of hgb may be from aggressive rehydration and dilutional effect.  Had been treated with po Iron but he  was told to stop this one week or so ago by Urologist.  * Left sided diverticulosis with presumed diverticular LGIB in 2010  *  Transaminitis.  AST > ALT.  Suspect this is related to the rhabdo and liver hypoperfusion  PLAN: *  LFTs in AM. *  Decisions re endoscopies per Dr Leone Payor, but given FOB negative not clearly indicated.    LOS: 4 days   Jennye Moccasin  03/18/2012, 11:04 AM Pager: [phone]  [facility] Attending  I have also seen and assessed the patient and agree with the above note. Other notes (inpatient and outpatient) as well as labs reviewed. I think he has an anemia of chronic disease with an acute exacerbation of anemia related to rahbdomyolsis and acute renal failure.  He does not need an endoscopic evaluation of GI tract for this. Supportive care and f/u PCP is appropriate.  Thanks  I appreciate the opportunity to care for this patient.  Iva Boop, MD, [facility] [phone] (pager) 03/18/2012 4:36 PM

## 2012-03-18 ENCOUNTER — Encounter (HOSPITAL_COMMUNITY): Payer: Self-pay | Admitting: Physician Assistant

## 2012-03-18 DIAGNOSIS — R710 Precipitous drop in hematocrit: Secondary | ICD-10-CM

## 2012-03-18 DIAGNOSIS — D638 Anemia in other chronic diseases classified elsewhere: Secondary | ICD-10-CM

## 2012-03-18 LAB — CBC
HCT: 25.2 % — ABNORMAL LOW (ref 39.0–52.0)
Hemoglobin: 8.2 g/dL — ABNORMAL LOW (ref 13.0–17.0)
MCV: 96.6 fL (ref 78.0–100.0)
RDW: 16.5 % — ABNORMAL HIGH (ref 11.5–15.5)
WBC: 12.1 10*3/uL — ABNORMAL HIGH (ref 4.0–10.5)

## 2012-03-18 LAB — BASIC METABOLIC PANEL
BUN: 34 mg/dL — ABNORMAL HIGH (ref 6–23)
Chloride: 111 mEq/L (ref 96–112)
Creatinine, Ser: 1.47 mg/dL — ABNORMAL HIGH (ref 0.50–1.35)
GFR calc Af Amer: 49 mL/min — ABNORMAL LOW (ref >90)
Glucose, Bld: 142 mg/dL — ABNORMAL HIGH (ref 70–99)

## 2012-03-18 LAB — TYPE AND SCREEN: Unit division: 0

## 2012-03-18 LAB — GLUCOSE, CAPILLARY: Glucose-Capillary: 107 mg/dL — ABNORMAL HIGH (ref 70–99)

## 2012-03-18 MED ORDER — SODIUM CHLORIDE 0.45 % IV SOLN
INTRAVENOUS | Status: DC
  Administered 2012-03-18 – 2012-03-19 (×2): via INTRAVENOUS

## 2012-03-18 MED ORDER — POTASSIUM CHLORIDE CRYS ER 20 MEQ PO TBCR
40.0000 meq | EXTENDED_RELEASE_TABLET | Freq: Two times a day (BID) | ORAL | Status: AC
  Administered 2012-03-18 (×2): 40 meq via ORAL
  Filled 2012-03-18: qty 2

## 2012-03-18 MED ORDER — POTASSIUM CHLORIDE CRYS ER 20 MEQ PO TBCR
EXTENDED_RELEASE_TABLET | ORAL | Status: AC
  Filled 2012-03-18: qty 2

## 2012-03-18 NOTE — Care Management Note (Unsigned)
    Page 1 of 1   03/18/2012     10:18:55 AM   CARE MANAGEMENT NOTE 03/18/2012  Patient:  [name], [name]   Account Number:  [id number]  Date Initiated:  03/18/2012  Documentation initiated by:  GRAVES-BIGELOW,Rahn Lacuesta  Subjective/Objective Assessment:   Pt admitted s/p  fall. Now presents with rhabdomyolysis and resulting generalized weakness     Action/Plan:   PT recommends SNF placement. CSW working with family for disposition needs. CM will contine to monitor.   Anticipated DC Date:  03/20/2012   Anticipated DC Plan:  SKILLED NURSING FACILITY  In-house referral  Clinical Social Worker      DC Planning Services  CM consult      Choice offered to / List presented to:             Status of service:  In process, will continue to follow Medicare Important Message given?   (If response is "NO", the following Medicare IM given date fields will be blank) Date Medicare IM given:   Date Additional Medicare IM given:    Discharge Disposition:    Per UR Regulation:  Reviewed for med. necessity/level of care/duration of stay  If discussed at Long Length of Stay Meetings, dates discussed:    Comments:

## 2012-03-18 NOTE — Progress Notes (Signed)
Physical Therapy Treatment Patient Details Name: [name] MRN: [id number] DOB: [birth year] Today's Date: 03/18/2012 Time: 6578-4696 PT Time Calculation (min): 28 min  PT Assessment / Plan / Recommendation Comments on Treatment Session  Family in room grooming patient with shave and haircut.  Noted bleeding at sites on chin after shaving and RN aware.  This patient well known to me from outpatient rehab.  Agree with need for rehab stay prior to d/c home.    Follow Up Recommendations  SNF           Equipment Recommendations  Rolling walker with 5" wheels       Frequency Min 3X/week   Plan Discharge plan remains appropriate    Precautions / Restrictions Precautions Precautions: Fall   Pertinent Vitals/Pain Denies pain    Mobility  Bed Mobility Bed Mobility: Supine to Sit Supine to Sit: 1: +2 Total assist;HOB elevated Supine to Sit: Patient Percentage: 40% Details for Bed Mobility Assistance: assist to get legs over, scoot to edge of bed and lift trunk Transfers Sit to Stand: 1: +2 Total assist;From bed;With upper extremity assist;From elevated surface Sit to Stand: Patient Percentage: 80% Stand to Sit: To chair/3-in-1;With armrests;With upper extremity assist;3: Mod assist Details for Transfer Assistance: assist to lower slowly into low recliner Ambulation/Gait Ambulation/Gait Assistance: 3: Mod assist;4: Min assist Ambulation Distance (Feet): 50 Feet Assistive device: Rolling walker Ambulation/Gait Assistance Details: shuffling feet on floor and counting with each step; assist with weight shift to initiate gait. Gait Pattern: Step-through pattern;Decreased stride length;Shuffle;Trunk flexed      PT Goals Acute Rehab PT Goals Pt will go Supine/Side to Sit: with supervision PT Goal: Supine/Side to Sit - Progress: Progressing toward goal Pt will go Sit to Stand: with supervision PT Goal: Sit to Stand - Progress: Progressing toward goal Pt will go Stand to Sit:  with supervision PT Goal: Stand to Sit - Progress: Progressing toward goal Pt will Ambulate: 51 - 150 feet;with supervision;with rolling walker PT Goal: Ambulate - Progress: Progressing toward goal  Visit Information  Last PT Received On: 03/18/12    Subjective Data  Subjective: I just wanted to get a shave.   Cognition  Overall Cognitive Status: Appears within functional limits for tasks assessed/performed Arousal/Alertness: Awake/alert Behavior During Session: [facility] for tasks performed       End of Session PT - End of Session Equipment Utilized During Treatment: Gait belt Activity Tolerance: Patient tolerated treatment well Patient left: in chair;with call bell/phone within reach;with family/visitor present   GP     [facility] 03/18/2012, 4:52 PM Sheran Lawless, PT [phone] 03/18/2012

## 2012-03-18 NOTE — Progress Notes (Signed)
Clinical Social Worker reviewed bedoffers with pt.  Pt has selected [facility].  CSW notified Bjorn Loser at [place].    Angelia Mould, MSW, [place] [phone]

## 2012-03-18 NOTE — Progress Notes (Signed)
[facility] PROGRESS NOTE  [name] BJY:[id number] DOB: 06/18/1926 DOA: 03/14/2012 PCP: Sandrea Hughs, MD  Neurologist: Dr. Anne Hahn  Assessment/Plan: 1. Status post mechanical fall: Secondary to left leg weakness, chronic, followed by neurology as an outpatient. No history of syncope or loss of consciousness. Orthostatics reported to be negative. 2. Rhabdomyolysis: Secondary to prolonged stay on the floor. Followup labs today. 3. Acute/chronic Anemia: Appears to have baseline Hgb 10. GI consulted, no evident bleeding. Hold ASA. Status post lower GI bleed 2007. On aspirin 81 mg daily prior to admission. Transfused last night. History of presumed diverticular GI bleed 2000. 4. Elevated transaminases: likely secondary to #2. Repeat in the morning. 5. Hypotension/hypertension: Present on admission, now resolved. Now hypertensive. 6. Acute renal failure/Chronic kidney disease: Acute component appears resolved. Continue to hold diuretics. 7. Left leg weakness: chronic, followed by neurology.  8. Difficult to control hypertension: stable.  9. Chronic leg swelling: stable.    Code Status: Full code Family Communication: none present [name] [address] [phone] Or [phone] Disposition Plan: Skilled nursing facility 1-2 days  Brendia Sacks, MD  [facility] Team 5 Pager [phone] If 7PM-7AM, please contact night-coverage at [email or web address], password [facility] 03/18/2012, 8:33 AM  LOS: 4 days   Brief narrative: 79 yo male lives alone and was not answering phone by family members, they called ems to ck on him and he was found down. Found to have rhabdo in ED with a on ckd. EDP noted left leg weakness, this was not seen by admitting physician.  Consultants:  Occupational therapy: Skilled nursing facility   Physical therapy: Skilled nursing facility  Procedures:  Bilateral carotid ultrasound: No ICA stenosis.   2-D echocardiogram: Left  ventricle: The cavity size was normal. There was severe concentric hypertrophy. Systolic function was normal. The estimated ejection fraction was in the range of 55% to 60%. Wall motion was normal; there were no regional wall motion abnormalities. There was an increased relative contribution of atrial contraction to ventricular filling. Doppler parameters are consistent with abnormal left ventricular relaxation (grade 1 diastolic dysfunction). - Aortic valve: Severe diffuse thickening and calcification. There was moderate stenosis. - Tricuspid valve: Moderate regurgitation. The right ventricular systolic pressure was increased consistent with moderate pulmonary hypertension.  Transfusion 2 units packed red blood cells  HPI/Subjective: Afebrile, MAXIMUM TEMPERATURE 100.6. No complaints. Eating well. Recalls clearly a dense prior to admission, denies syncope. Left leg gave out. Has chronic left leg weakness followed by neurology. No new issues. Transfuse 2 units packed red blood cells overnight.  Objective: Filed Vitals:   03/17/12 2000 03/18/12 0000 03/18/12 0400 03/18/12 0800  BP: 148/64 143/65 164/78 161/83  Pulse: 80 90 74 72  Temp: 100.6 F (38.1 C) 97.5 F (36.4 C) 97.8 F (36.6 C) 99 F (37.2 C)  TempSrc: Axillary Oral Oral Oral  Resp:  20 20 20   Height:      Weight:      SpO2: 100% 97% 94% 97%    Intake/Output Summary (Last 24 hours) at 03/18/12 0833 Last data filed at 03/18/12 0730  Gross per 24 hour  Intake    962 ml  Output   4300 ml  Net  -3338 ml   Filed Weights   03/14/12 2300 03/14/12 2330 03/17/12 0000  Weight: 88.451 kg (195 lb) 85.7 kg (188 lb 15 oz) 89.5 kg (197 lb 5 oz)    Exam:  General:  Appears calm and comfortable ENT: grossly normal hearing Cardiovascular: RRR, no  m/r/g. No LE edema. Telemetry: SR, no arrhythmias  Respiratory: CTA bilaterally, no w/r/r. Normal respiratory effort. Musculoskeletal: grossly normal tone BUE, left lower  extremity able to lift off the bed but weaker than the right. Able to lift right leg off the bed. Psychiatric: grossly normal mood and affect, speech fluent and appropriate  Data Reviewed: Basic Metabolic Panel:  Lab 03/17/12 1914 03/15/12 0550 03/14/12 1748  NA 149* 145 145  K 3.4* 3.6 3.7  CL 112 107 104  CO2 28 26 26   GLUCOSE 116* 93 115*  BUN 43* 55* 54*  CREATININE 1.70* 2.34* 2.34*  CALCIUM 8.4 8.7 9.3  MG -- -- --  PHOS -- -- --   Liver Function Tests:  Lab 03/14/12 1748  AST 378*  ALT 114*  ALKPHOS 61  BILITOT 0.9  PROT 7.1  ALBUMIN 3.7   CBC:  Lab 03/17/12 0540 03/15/12 0550 03/14/12 1748  WBC 12.1* 15.0* 19.7*  NEUTROABS -- -- --  HGB 6.8* 8.6* 9.8*  HCT 21.2* 26.3* 29.7*  MCV 98.1 96.3 96.4  PLT 133* 167 179   Cardiac Enzymes:  Lab 03/15/12 1739 03/15/12 1042 03/15/12 0550 03/14/12 2233 03/14/12 1748  CKTOTAL 7844* 9567* 10118* 13199* 16825*  CKMB 18.9* 33.9* 43.5* 89.3* 137.2*  CKMBINDEX -- -- -- -- --  TROPONINI -- -- -- -- --   CBG:  Lab 03/18/12 0725 03/14/12 1744  GLUCAP 99 89     Studies: No results found.  Scheduled Meds:   . amLODipine  5 mg Oral Daily  . aspirin EC  81 mg Oral Daily  . docusate sodium  100 mg Oral BID  . isosorbide-hydrALAZINE  1 tablet Oral BID  . polyethylene glycol  17 g Oral Daily  . sodium chloride  500 mL Intravenous Once   Continuous Infusions:   . sodium chloride 20 mL/hr at 03/16/12 1609    Principal Problem:  *Rhabdomyolysis Active Problems:  RENAL FAILURE  EDEMA, CHRONIC  Left leg weakness  Leukocytosis  Dehydration     Brendia Sacks, MD  [facility] Team 5 Pager [phone] If 7PM-7AM, please contact night-coverage at [email or web address], password [facility] 03/18/2012, 8:33 AM  LOS: 4 days   Time spent: 25 minutes

## 2012-03-19 LAB — CBC
Hemoglobin: 8.1 g/dL — ABNORMAL LOW (ref 13.0–17.0)
MCH: 31.9 pg (ref 26.0–34.0)
Platelets: 138 10*3/uL — ABNORMAL LOW (ref 150–400)
RBC: 2.54 MIL/uL — ABNORMAL LOW (ref 4.22–5.81)
WBC: 12.7 10*3/uL — ABNORMAL HIGH (ref 4.0–10.5)

## 2012-03-19 LAB — COMPREHENSIVE METABOLIC PANEL
ALT: 65 U/L — ABNORMAL HIGH (ref 0–53)
AST: 108 U/L — ABNORMAL HIGH (ref 0–37)
Alkaline Phosphatase: 50 U/L (ref 39–117)
CO2: 28 mEq/L (ref 19–32)
Chloride: 113 mEq/L — ABNORMAL HIGH (ref 96–112)
GFR calc Af Amer: 51 mL/min — ABNORMAL LOW (ref >90)
GFR calc non Af Amer: 44 mL/min — ABNORMAL LOW (ref >90)
Glucose, Bld: 107 mg/dL — ABNORMAL HIGH (ref 70–99)
Potassium: 4.3 mEq/L (ref 3.5–5.1)
Sodium: 149 mEq/L — ABNORMAL HIGH (ref 135–145)
Total Bilirubin: 1.4 mg/dL — ABNORMAL HIGH (ref 0.3–1.2)

## 2012-03-19 MED ORDER — ISOSORB DINITRATE-HYDRALAZINE 20-37.5 MG PO TABS: 1.0000 | ORAL_TABLET | Freq: Two times a day (BID) | ORAL | Status: DC

## 2012-03-19 MED ORDER — AMLODIPINE BESYLATE 5 MG PO TABS: 5.0000 mg | ORAL_TABLET | Freq: Every day | ORAL | Status: DC

## 2012-03-19 MED ORDER — POLYETHYLENE GLYCOL 3350 17 G PO PACK: 17.0000 g | PACK | Freq: Every day | ORAL | Status: DC

## 2012-03-19 MED ORDER — DSS 100 MG PO CAPS: 100.0000 mg | ORAL_CAPSULE | Freq: Two times a day (BID) | ORAL | Status: AC

## 2012-03-19 NOTE — Progress Notes (Signed)
Clinical Social Worker submitted [employer] summary and AVS to [place] at [place]; SNF currently ready for pt . CSW  CSW to arrange transportation.     Angelia Mould, MSW, [place] [phone]

## 2012-03-19 NOTE — Progress Notes (Signed)
Clinical Social Worker staffed case with MD; pt ready for dc to SNF today.  CSW alerted [place] at [place].    Angelia Mould, MSW, [place] [phone]

## 2012-03-19 NOTE — Discharge Summary (Signed)
Physician Discharge Summary  [name] ZOX:[id number] DOB: 16-Apr-1926 DOA: 03/14/2012  PCP: Dillon Hughs, MD Neurologist: Dr. Anne Hahn  Admit date: 03/14/2012 Discharge date: 03/19/2012  Recommendations for Outpatient Follow-up:  1. Followup short-term rehabilitation 2. Followup acute/chronic anemia, see discussion below 3. Followup mild leukocytosis and thrombocytopenia of unclear significance. 4. Followup chronic kidney disease stage III as indicated.  5. Followup mild hypernatremia, expected to resolve spontaneously 6. Followup hypertension, see discussion below  7. Chronic leg edema, consider resuming Lasix as indicated  Follow-up Information    Follow up with Dillon Hughs, MD. In 3 weeks.   Contact information:   [address] [place] [address] [phone]         Discharge Diagnoses:  1. Status post mechanical fall 2. Rhabdomyolysis 3. Acute/chronic Anemia 4. Elevated transaminases 5. Mild leukocytosis 6. Mild thrombocytopenia 7. Acute renal failure/Chronic kidney disease stage III 8. Chronic Left leg weakness   9. Difficult to control hypertension 10. Chronic leg swelling  Discharge Condition: Improved Disposition: Skilled nursing facility  Diet recommendation: Heart healthy  Filed Weights   03/14/12 2300 03/14/12 2330 03/17/12 0000  Weight: 88.451 kg (195 lb) 85.7 kg (188 lb 15 oz) 89.5 kg (197 lb 5 oz)    History of present illness:  79 yo male lives alone and was not answering phone by family members, they called ems to ck on him and he was found down. Found to have rhabdo in ED with a on ckd. EDP noted left leg weakness, this was not seen by admitting physician.  Hospital Course:  [name] was admitted for treatment of rhabdomyolysis, acute renal failure. Kidney function improved with IV fluids, total CK is trending down. Rhabdomyolysis was secondary to a fall with prolonged immobility on the floor. Fall was mechanical in nature secondary to  chronic left leg weakness. Developed acute on chronic anemia during this hospitalization, was seen by GI, felt to be secondary to chronic disease and no further evaluations was suggested. Hemoglobin stable status post transfusion. Aspirin currently on hold. Mild leukocytosis and thrombocytopenia of unclear significance was seen, stable on no evidence of infection. Hypertension stable here off usual medications. Was evaluated by therapy and recommended skilled nursing facility for short-term rehabilitation.  1. Status post mechanical fall: Secondary to left leg weakness, chronic, followed by neurology as an outpatient. No history of syncope or loss of consciousness. Orthostatics reported to be negative.  2. Rhabdomyolysis: Secondary to prolonged stay on the floor. CK trending down quickly. Kidney function preserved.  3. Acute/chronic Anemia: Suspected to be from aggressive rehydration and dilutional effect. Appears to have baseline Hgb 10. GI consulted, discussed with Dr. Leone Payor, thought to have anemia of chronic disease with acute exacerbation of anemia related to rhabdomyolysis and acute renal failure. No further GI workup recommended.. Hold ASA.  4. Elevated transaminases: likely secondary to #2. Improving.  5. Mild leukocytosis: Improved from admission, no evidence of infection. Suspect related to acute issues. Follow for resolution as an outpatient.  6. Mild thrombocytopenia: Stable. Seen in August and May 2013. Followup as an outpatient.  7. Acute renal failure/Chronic kidney disease stage III: Acute component appears resolved. Continue to hold diuretics. May need to resume Lasix in the future.  8. Left leg weakness: chronic, followed by neurology.    9. Difficult to control hypertension: stable on Norvasc, Bidil. Resume atenolol. Previously was on minoxidil, clonidine, Lasix. Resume as needed. 10. Chronic leg swelling: stable.   Consultants:  Occupational therapy: Skilled nursing facility  Physical therapy: Skilled nursing facility  Procedures:  Bilateral carotid ultrasound: Bilateral: mild mixed plaque with acoustic shadowing noted origin ICA. No signficant ICA stenosis. Vertebral artery flow is antegrade.  2-D echocardiogram: Left ventricle: The cavity size was normal. There was severe concentric hypertrophy. Systolic function was normal. The estimated ejection fraction was in the range of 55% to 60%. Wall motion was normal; there were no regional wall motion abnormalities. There was an increased relative contribution of atrial contraction to ventricular filling. Doppler parameters are consistent with abnormal left ventricular relaxation (grade 1 diastolic dysfunction). - Aortic valve: Severe diffuse thickening and calcification.There was moderate stenosis. - Tricuspid valve: Moderate regurgitation. The right ventricular systolic pressure was increased consistent with moderate pulmonary hypertension.  Transfusion 2 units packed red blood cells   Discharge Instructions  Discharge Orders    Future Appointments: Provider: Department: Dept Phone: Center:   06/03/2012 9:00 AM Julio Sicks, NP [facility] [phone] None     Future Orders Please Complete By Expires   Diet - low sodium heart healthy      Increase activity slowly          Medication List     As of 03/19/2012  1:53 PM    STOP taking these medications         aspirin EC 81 MG tablet      cloNIDine 0.2 MG tablet   Commonly known as: CATAPRES      colchicine 0.6 MG tablet      furosemide 80 MG tablet   Commonly known as: LASIX      minoxidil 10 MG tablet   Commonly known as: LONITEN      multivitamin with minerals Tabs      TAKE these medications         acetaminophen 325 MG tablet   Commonly known as: TYLENOL   Take 650 mg by mouth 2 (two) times daily as needed. For pain      allopurinol 300 MG tablet   Commonly known as: ZYLOPRIM   Take 300 mg by mouth daily.       amLODipine 5 MG tablet   Commonly known as: NORVASC   Take 1 tablet (5 mg total) by mouth daily.      atenolol 100 MG tablet   Commonly known as: TENORMIN   Take 50 mg by mouth daily.      DSS 100 MG Caps   Take 100 mg by mouth 2 (two) times daily.      isosorbide-hydrALAZINE 20-37.5 MG per tablet   Commonly known as: BIDIL   Take 1 tablet by mouth 2 (two) times daily.      polyethylene glycol packet   Commonly known as: MIRALAX / GLYCOLAX   Take 17 g by mouth daily.      PROTEGRA PO   Take 1 capsule by mouth daily.        The results of significant diagnostics from this hospitalization (including imaging, microbiology, ancillary and laboratory) are listed below for reference.    Significant Diagnostic Studies: Dg Chest 2 View  03/15/2012  *RADIOLOGY REPORT*  Clinical Data: Rule out pneumonia  CHEST - 2 VIEW  Comparison: Prior chest x-ray 03/14/2012  Findings: No significant interval change in the appearance of the chest compared to yesterday.  No developing airspace consolidation to suggest pneumonia.  Unchanged cardiomegaly.  No acute osseous abnormality.  No pleural effusion, pneumothorax or pulmonary edema.  IMPRESSION: No acute cardiopulmonary disease and no significant  interval change compared to yesterday's chest x-ray.   Original Report Authenticated By: Malachy Moan, M.D.      Ct Head Wo Contrast  03/14/2012  *RADIOLOGY REPORT*  Clinical Data: Weakness in left leg.  Found on floor in his apartment.  CT HEAD WITHOUT CONTRAST  Technique:  Contiguous axial images were obtained from the base of the skull through the vertex without contrast.  Comparison: MRI brain 10/23/2011.  Findings:  There is no evidence for acute infarction, intracranial hemorrhage, mass lesion, hydrocephalus, or extra-axial fluid.Moderate atrophy is present.  There is moderately advanced chronic microvascular ischemic change of the periventricular and subcortical white matter.  Calvarium is intact.   Carotid and vertebral atherosclerosis is noted.  There is no acute sinus or mastoid disease.  Compared with prior MR, the appearance is similar.  IMPRESSION: No acute intracranial ischemic or post traumatic findings.  Chronic changes as described.   Original Report Authenticated By: Davonna Belling, M.D.       Labs: Basic Metabolic Panel:  Lab 03/19/12 4098 03/18/12 0905 03/17/12 0540 03/15/12 0550 03/14/12 1748  NA 149* 150* 149* 145 145  K 4.3 3.4* 3.4* 3.6 3.7  CL 113* 111 112 107 104  CO2 28 28 28 26 26   GLUCOSE 107* 142* 116* 93 115*  BUN 33* 34* 43* 55* 54*  CREATININE 1.43* 1.47* 1.70* 2.34* 2.34*  CALCIUM 8.6 8.7 8.4 8.7 9.3  MG -- -- -- -- --  PHOS -- -- -- -- --   Liver Function Tests:  Lab 03/19/12 0629 03/14/12 1748  AST 108* 378*  ALT 65* 114*  ALKPHOS 50 61  BILITOT 1.4* 0.9  PROT 6.6 7.1  ALBUMIN 3.2* 3.7   CBC:  Lab 03/19/12 0629 03/18/12 0905 03/17/12 0540 03/15/12 0550 03/14/12 1748  WBC 12.7* 12.1* 12.1* 15.0* 19.7*  NEUTROABS -- -- -- -- --  HGB 8.1* 8.2* 6.8* 8.6* 9.8*  HCT 25.1* 25.2* 21.2* 26.3* 29.7*  MCV 98.8 96.6 98.1 96.3 96.4  PLT 138* 138* 133* 167 179   Cardiac Enzymes:  Lab 03/19/12 0629 03/18/12 0905 03/15/12 1739 03/15/12 1042 03/15/12 0550 03/14/12 2233 03/14/12 1748  CKTOTAL 1545* 2360* 7844* 9567* 10118* -- --  CKMB -- -- 18.9* 33.9* 43.5* 89.3* 137.2*  CKMBINDEX -- -- -- -- -- -- --  TROPONINI -- -- -- -- -- -- --   CBG:  Lab 03/19/12 0727 03/18/12 2005 03/18/12 1124 03/18/12 0725 03/14/12 1744  GLUCAP 99 114* 107* 99 89    Principal Problem:  *Rhabdomyolysis Active Problems:  Anemia of chronic disease  RENAL FAILURE  EDEMA, CHRONIC  Left leg weakness  Leukocytosis  Dehydration  Hemoglobin drop   Time coordinating discharge: 35 minutes  Signed:  Brendia Sacks, MD [facility] 03/19/2012, 1:53 PM

## 2012-03-19 NOTE — Progress Notes (Signed)
[facility] PROGRESS NOTE  [name]:[id number] DOB: August 30, 1926 DOA: 03/14/2012 PCP: Sandrea Hughs, MD  Neurologist: Dr. Anne Hahn  Assessment/Plan: 1. Status post mechanical fall: Secondary to left leg weakness, chronic, followed by neurology as an outpatient. No history of syncope or loss of consciousness. Orthostatics reported to be negative. 2. Rhabdomyolysis: Secondary to prolonged stay on the floor. CK trending down quickly. Kidney function preserved. 3. Acute/chronic Anemia: Suspected to be from aggressive rehydration and dilutional effect. Appears to have baseline Hgb 10. GI consulted, discussed with Dr. Leone Payor, thought to have anemia of chronic disease with acute exacerbation of anemia related to rhabdomyolysis and acute renal failure. No further GI workup recommended.. Hold ASA. 4. Elevated transaminases: likely secondary to #2. Improving. 5. Mild leukocytosis: Improved from admission, no evidence of infection. Suspect related to acute issues. Follow for resolution as an outpatient. 6. Mild thrombocytopenia: Stable. Seen in August and May 2013. Followup as an outpatient. 7. Acute renal failure/Chronic kidney disease stage III: Acute component appears resolved. Continue to hold diuretics. May need to resume Lasix in the future. 8. Left leg weakness: chronic, followed by neurology.  9. Difficult to control hypertension: stable on Norvasc, Bidil. Resume atenolol. Previously was on minoxidil, clonidine, Lasix. 10. Chronic leg swelling: stable.    Code Status: Full code Family Communication: none present Terrall Bley [address] [phone] Or [phone] Disposition Plan: Skilled nursing facility today  Brendia Sacks, MD  [facility] Team 5 Pager [phone] If 7PM-7AM, please contact night-coverage at [email or web address], password [facility] 03/19/2012, 1:25 PM  LOS: 5 days   Brief narrative: 79 yo male lives alone and was not answering phone by  family members, they called ems to ck on him and he was found down. Found to have rhabdo in ED with a on ckd. EDP noted left leg weakness, this was not seen by admitting physician.  Consultants:  Occupational therapy: Skilled nursing facility   Physical therapy: Skilled nursing facility  Procedures:  Bilateral carotid ultrasound: Bilateral: mild mixed plaque with acoustic shadowing noted origin ICA. No signficant ICA stenosis. Vertebral artery flow is antegrade.   2-D echocardiogram: Left ventricle: The cavity size was normal. There was severe concentric hypertrophy. Systolic function was normal. The estimated ejection fraction was in the range of 55% to 60%. Wall motion was normal; there were no regional wall motion abnormalities. There was an increased relative contribution of atrial contraction to ventricular filling. Doppler parameters are consistent with abnormal left ventricular relaxation (grade 1 diastolic dysfunction). - Aortic valve: Severe diffuse thickening and calcification.There was moderate stenosis. - Tricuspid valve: Moderate regurgitation. The right ventricular systolic pressure was increased consistent with moderate pulmonary hypertension.  Transfusion 2 units packed red blood cells  HPI/Subjective: Afebrile, vital signs stable. Eating well. No complaints.  Objective: Filed Vitals:   03/19/12 0000 03/19/12 0400 03/19/12 0800 03/19/12 1104  BP: 142/73 145/83 144/73 146/84  Pulse: 89 79 66   Temp: 98.4 F (36.9 C) 98.8 F (37.1 C) 99 F (37.2 C)   TempSrc: Oral Oral Oral   Resp: 20 18 17    Height:      Weight:      SpO2: 93% 93% 98%     Intake/Output Summary (Last 24 hours) at 03/19/12 1325 Last data filed at 03/19/12 0800  Gross per 24 hour  Intake    840 ml  Output   1700 ml  Net   -860 ml   Filed Weights   03/14/12 2300 03/14/12 2330 03/17/12  0000  Weight: 88.451 kg (195 lb) 85.7 kg (188 lb 15 oz) 89.5 kg (197 lb 5 oz)    Exam:  General:   Appears calm and comfortable Cardiovascular: RRR, no m/r/g. No LE edema. Telemetry: SR, no arrhythmias  Respiratory: CTA bilaterally, no w/r/r. Normal respiratory effort. Musculoskeletal: grossly normal tone BUE, left lower extremity able to lift off the bed but weaker than the right.  Psychiatric: grossly normal mood and affect, speech fluent and appropriate  Data Reviewed: Basic Metabolic Panel:  Lab 03/19/12 1478 03/18/12 0905 03/17/12 0540 03/15/12 0550 03/14/12 1748  NA 149* 150* 149* 145 145  K 4.3 3.4* 3.4* 3.6 3.7  CL 113* 111 112 107 104  CO2 28 28 28 26 26   GLUCOSE 107* 142* 116* 93 115*  BUN 33* 34* 43* 55* 54*  CREATININE 1.43* 1.47* 1.70* 2.34* 2.34*  CALCIUM 8.6 8.7 8.4 8.7 9.3  MG -- -- -- -- --  PHOS -- -- -- -- --   Liver Function Tests:  Lab 03/19/12 0629 03/14/12 1748  AST 108* 378*  ALT 65* 114*  ALKPHOS 50 61  BILITOT 1.4* 0.9  PROT 6.6 7.1  ALBUMIN 3.2* 3.7   CBC:  Lab 03/19/12 0629 03/18/12 0905 03/17/12 0540 03/15/12 0550 03/14/12 1748  WBC 12.7* 12.1* 12.1* 15.0* 19.7*  NEUTROABS -- -- -- -- --  HGB 8.1* 8.2* 6.8* 8.6* 9.8*  HCT 25.1* 25.2* 21.2* 26.3* 29.7*  MCV 98.8 96.6 98.1 96.3 96.4  PLT 138* 138* 133* 167 179   Cardiac Enzymes:  Lab 03/19/12 0629 03/18/12 0905 03/15/12 1739 03/15/12 1042 03/15/12 0550 03/14/12 2233 03/14/12 1748  CKTOTAL 1545* 2360* 7844* 9567* 10118* -- --  CKMB -- -- 18.9* 33.9* 43.5* 89.3* 137.2*  CKMBINDEX -- -- -- -- -- -- --  TROPONINI -- -- -- -- -- -- --   CBG:  Lab 03/19/12 0727 03/18/12 2005 03/18/12 1124 03/18/12 0725 03/14/12 1744  GLUCAP 99 114* 107* 99 89     Studies: No results found.  Scheduled Meds:    . amLODipine  5 mg Oral Daily  . docusate sodium  100 mg Oral BID  . isosorbide-hydrALAZINE  1 tablet Oral BID  . polyethylene glycol  17 g Oral Daily  . sodium chloride  500 mL Intravenous Once   Continuous Infusions:    . sodium chloride 50 mL/hr at 03/19/12 2956    Principal  Problem:  *Rhabdomyolysis Active Problems:  Anemia of chronic disease  RENAL FAILURE  EDEMA, CHRONIC  Left leg weakness  Leukocytosis  Dehydration  Hemoglobin drop     Brendia Sacks, MD  [facility] Team 5 Pager [phone] If 7PM-7AM, please contact night-coverage at [email or web address], password [facility] 03/19/2012, 1:25 PM  LOS: 5 days

## 2012-03-23 ENCOUNTER — Telehealth: Payer: Self-pay | Admitting: Internal Medicine

## 2012-03-23 NOTE — Telephone Encounter (Signed)
If he's in skilled care I can't treat him over the phone though agree if concern he's getting dehyrdrated may need to hold lasix and best option is to talk to NH doc and see [place] w/in a week of discharge with all meds in hand

## 2012-03-23 NOTE — Telephone Encounter (Signed)
Spoke with Carney Bern patients sister, patient admitted to [facility] nursing home 03/19/12 after discharge from [facility] after falling and being found in rhabdo.  Per Carney Bern patients BP was 96/40 x1 day ago at lunch time and shortly thereafter came up to 121/53 Patient has been taken of Norvasc by the NH MD. Patient seems to be very dehydrated and has not been drinking and now has been receiving saline boluses which do not seem to be helping because per Carney Bern they are thinking patient may need to go back to hospital for drowsiness r/t his heart and/or kidneys Carney Bern states the selling in patients legs have gone done, and Jeans primary concern is should patient still be taking his Lasix 20mg  BID as well, she feels this along with many other meds are having an effect on patients heart.  Carney Bern would like recs per Dr. [name] as to if patient should still be taking Lasix especially.  Dr. [name] please advise, thank you

## 2012-03-23 NOTE — Telephone Encounter (Signed)
Spoke with pt's sister and notified of recs per MW.  She verbalized understanding and states nothing further needed.

## 2012-03-24 ENCOUNTER — Encounter (HOSPITAL_COMMUNITY): Payer: Self-pay

## 2012-03-24 ENCOUNTER — Other Ambulatory Visit: Payer: Self-pay

## 2012-03-24 ENCOUNTER — Inpatient Hospital Stay (HOSPITAL_COMMUNITY)
Admission: EM | Admit: 2012-03-24 | Discharge: 2012-03-27 | DRG: 682 | Disposition: A | Discharge status: Skilled Nursing Facility | Payer: Medicare Other | Attending: Internal Medicine | Admitting: Internal Medicine

## 2012-03-24 ENCOUNTER — Emergency Department (HOSPITAL_COMMUNITY): Payer: Medicare Other

## 2012-03-24 DIAGNOSIS — M109 Gout, unspecified: Secondary | ICD-10-CM | POA: Diagnosis present

## 2012-03-24 DIAGNOSIS — Z8546 Personal history of malignant neoplasm of prostate: Secondary | ICD-10-CM

## 2012-03-24 DIAGNOSIS — N189 Chronic kidney disease, unspecified: Secondary | ICD-10-CM

## 2012-03-24 DIAGNOSIS — K5900 Constipation, unspecified: Secondary | ICD-10-CM

## 2012-03-24 DIAGNOSIS — E785 Hyperlipidemia, unspecified: Secondary | ICD-10-CM

## 2012-03-24 DIAGNOSIS — D638 Anemia in other chronic diseases classified elsewhere: Secondary | ICD-10-CM | POA: Diagnosis present

## 2012-03-24 DIAGNOSIS — C61 Malignant neoplasm of prostate: Secondary | ICD-10-CM

## 2012-03-24 DIAGNOSIS — R4182 Altered mental status, unspecified: Secondary | ICD-10-CM

## 2012-03-24 DIAGNOSIS — Z79899 Other long term (current) drug therapy: Secondary | ICD-10-CM

## 2012-03-24 DIAGNOSIS — I10 Essential (primary) hypertension: Secondary | ICD-10-CM

## 2012-03-24 DIAGNOSIS — M6282 Rhabdomyolysis: Secondary | ICD-10-CM

## 2012-03-24 DIAGNOSIS — R29898 Other symptoms and signs involving the musculoskeletal system: Secondary | ICD-10-CM | POA: Diagnosis present

## 2012-03-24 DIAGNOSIS — E860 Dehydration: Secondary | ICD-10-CM | POA: Diagnosis present

## 2012-03-24 DIAGNOSIS — R609 Edema, unspecified: Secondary | ICD-10-CM

## 2012-03-24 DIAGNOSIS — Z66 Do not resuscitate: Secondary | ICD-10-CM | POA: Diagnosis present

## 2012-03-24 DIAGNOSIS — E870 Hyperosmolality and hypernatremia: Secondary | ICD-10-CM

## 2012-03-24 DIAGNOSIS — N19 Unspecified kidney failure: Secondary | ICD-10-CM

## 2012-03-24 DIAGNOSIS — N184 Chronic kidney disease, stage 4 (severe): Secondary | ICD-10-CM | POA: Diagnosis present

## 2012-03-24 DIAGNOSIS — I252 Old myocardial infarction: Secondary | ICD-10-CM

## 2012-03-24 DIAGNOSIS — K5730 Diverticulosis of large intestine without perforation or abscess without bleeding: Secondary | ICD-10-CM

## 2012-03-24 DIAGNOSIS — R710 Precipitous drop in hematocrit: Secondary | ICD-10-CM

## 2012-03-24 DIAGNOSIS — D72829 Elevated white blood cell count, unspecified: Secondary | ICD-10-CM | POA: Diagnosis present

## 2012-03-24 DIAGNOSIS — I129 Hypertensive chronic kidney disease with stage 1 through stage 4 chronic kidney disease, or unspecified chronic kidney disease: Secondary | ICD-10-CM | POA: Diagnosis present

## 2012-03-24 DIAGNOSIS — G9340 Encephalopathy, unspecified: Secondary | ICD-10-CM

## 2012-03-24 DIAGNOSIS — N179 Acute kidney failure, unspecified: Principal | ICD-10-CM

## 2012-03-24 DIAGNOSIS — N289 Disorder of kidney and ureter, unspecified: Secondary | ICD-10-CM

## 2012-03-24 LAB — URINALYSIS, ROUTINE W REFLEX MICROSCOPIC
Glucose, UA: NEGATIVE mg/dL
Ketones, ur: NEGATIVE mg/dL
Leukocytes, UA: NEGATIVE
Nitrite: NEGATIVE
Protein, ur: NEGATIVE mg/dL
Urobilinogen, UA: 1 mg/dL (ref 0.0–1.0)

## 2012-03-24 LAB — CBC WITH DIFFERENTIAL/PLATELET
Eosinophils Absolute: 0 10*3/uL (ref 0.0–0.7)
Eosinophils Relative: 0 % (ref 0–5)
Hemoglobin: 8.5 g/dL — ABNORMAL LOW (ref 13.0–17.0)
Lymphocytes Relative: 5 % — ABNORMAL LOW (ref 12–46)
Lymphs Abs: 0.9 10*3/uL (ref 0.7–4.0)
MCH: 32.8 pg (ref 26.0–34.0)
MCV: 100.4 fL — ABNORMAL HIGH (ref 78.0–100.0)
Monocytes Relative: 9 % (ref 3–12)
Neutrophils Relative %: 85 % — ABNORMAL HIGH (ref 43–77)
RBC: 2.59 MIL/uL — ABNORMAL LOW (ref 4.22–5.81)
WBC: 15.6 10*3/uL — ABNORMAL HIGH (ref 4.0–10.5)

## 2012-03-24 LAB — COMPREHENSIVE METABOLIC PANEL
Alkaline Phosphatase: 56 U/L (ref 39–117)
BUN: 61 mg/dL — ABNORMAL HIGH (ref 6–23)
CO2: 24 mEq/L (ref 19–32)
GFR calc Af Amer: 32 mL/min — ABNORMAL LOW (ref >90)
GFR calc non Af Amer: 27 mL/min — ABNORMAL LOW (ref >90)
Glucose, Bld: 116 mg/dL — ABNORMAL HIGH (ref 70–99)
Potassium: 4 mEq/L (ref 3.5–5.1)
Total Protein: 7.1 g/dL (ref 6.0–8.3)

## 2012-03-24 LAB — TROPONIN I: Troponin I: <0.3 ng/mL (ref <0.30)

## 2012-03-24 MED ORDER — ACETAMINOPHEN 325 MG PO TABS: 650.0000 mg | ORAL_TABLET | Freq: Four times a day (QID) | ORAL | Status: DC | PRN

## 2012-03-24 MED ORDER — SODIUM CHLORIDE 0.9 % IV BOLUS (SEPSIS)
700.0000 mL | Freq: Once | INTRAVENOUS | Status: AC
  Administered 2012-03-24: 700 mL via INTRAVENOUS

## 2012-03-24 MED ORDER — ISOSORB DINITRATE-HYDRALAZINE 20-37.5 MG PO TABS
1.0000 | ORAL_TABLET | Freq: Two times a day (BID) | ORAL | Status: DC
  Administered 2012-03-25 – 2012-03-27 (×5): 1 via ORAL
  Filled 2012-03-24 (×6): qty 1

## 2012-03-24 MED ORDER — SODIUM CHLORIDE 0.9 % IV SOLN: INTRAVENOUS | Status: DC

## 2012-03-24 MED ORDER — ATENOLOL 50 MG PO TABS
50.0000 mg | ORAL_TABLET | Freq: Every day | ORAL | Status: DC
  Administered 2012-03-25 – 2012-03-27 (×3): 50 mg via ORAL
  Filled 2012-03-24 (×3): qty 1

## 2012-03-24 MED ORDER — SODIUM CHLORIDE 0.9 % IV SOLN
INTRAVENOUS | Status: DC
  Administered 2012-03-24 – 2012-03-25 (×3): via INTRAVENOUS

## 2012-03-24 MED ORDER — ACETAMINOPHEN 650 MG RE SUPP: 650.0000 mg | Freq: Four times a day (QID) | RECTAL | Status: DC | PRN

## 2012-03-24 MED ORDER — BIOTENE DRY MOUTH MT LIQD
15.0000 mL | Freq: Two times a day (BID) | OROMUCOSAL | Status: DC
  Administered 2012-03-24 – 2012-03-27 (×4): 15 mL via OROMUCOSAL

## 2012-03-24 MED ORDER — SODIUM CHLORIDE 0.9 % IV SOLN: INTRAVENOUS | Status: AC

## 2012-03-24 MED ORDER — FLUORESCEIN SODIUM 1 MG OP STRP
ORAL_STRIP | OPHTHALMIC | Status: AC
  Filled 2012-03-24: qty 1

## 2012-03-24 NOTE — ED Notes (Signed)
Spoke with pt's sister, she has reached a decision about life support, she said DNR.  Will page Dr. Irene Limbo and notify him to call pt's sister.

## 2012-03-24 NOTE — ED Notes (Signed)
Diet tray ordered 

## 2012-03-24 NOTE — ED Notes (Signed)
Pt from [facility], family feels he is not doing well, possible FTT, or UTI, acting very tired, will talk when spoken to,

## 2012-03-24 NOTE — ED Provider Notes (Signed)
History     CSN: [id number]  Arrival date & time 03/24/12  1121   First MD Initiated Contact with Patient 03/24/12 1147      Chief Complaint  Patient presents with  . Altered Mental Status   Level V caveat for altered mental status  (Consider location/radiation/quality/duration/timing/severity/associated sxs/prior treatment) HPI  History obtained from patient's sister who has power of attorney. She states patient was seen in the emergency department on January 18 after falling and he was in the hospital until January 23. She states she was discharged to a nursing home. She reports he seemed to be doing better however 3 days ago at the nursing home he started to get more sleepy. He started talking less. She states he has not been eating. She states she has not had a bowel movement for at least a week and he is supposed to be on MiraLAX. She is unaware of any vomiting or diarrhea or fever. When I awaken the patient he denies being in any pain. He does state however he feels bad but cannot tell me how he feels bad.  PCP Dr. [name]  Past Medical History  Diagnosis Date  . Diverticulosis     divertic bleed presmed in  2010  . Gout   . Leg swelling     left  . Hyperlipidemia   . History of myocardial infarction   . Olecranon bursitis   . Renal failure   . HTN (hypertension)   . Anemia   . Healthcare maintenance   . Prostate cancer     Past Surgical History  Procedure Date  . R ia aneurysmectomy     scar is in RLQ  . Prostate cryoablation 2006  . Suprapubic catheter placement 2006    Family History  Problem Relation Age of Onset  . Hypertension Brother   . Pancreatic cancer Brother   . Stroke Father   . Stroke Mother     History  Substance Use Topics  . Smoking status: Never Smoker   . Smokeless tobacco: Never Used  . Alcohol Use: No   Patient currently in nursing home, prior to January 18 was living at home alone in an apartment.   Review of Systems  Unable to  perform ROS: Mental status change    Allergies  Review of patient's allergies indicates no known allergies.  Home Medications   Current Outpatient Rx  Name  Route  Sig  Dispense  Refill  . ACETAMINOPHEN 325 MG PO TABS   Oral   Take 650 mg by mouth 2 (two) times daily as needed. For pain         . ALLOPURINOL 300 MG PO TABS   Oral   Take 300 mg by mouth daily.         . ATENOLOL 100 MG PO TABS   Oral   Take 50 mg by mouth daily.         . ISOSORB DINITRATE-HYDRALAZINE 20-37.5 MG PO TABS   Oral   Take 1 tablet by mouth 2 (two) times daily.         Marland Kitchen PROTEGRA PO   Oral   Take 1 capsule by mouth daily.           . DSS 100 MG PO CAPS   Oral   Take 100 mg by mouth 2 (two) times daily.         Marland Kitchen POLYETHYLENE GLYCOL 3350 PO PACK   Oral   Take 17 g  by mouth daily.           BP 127/59  Pulse 67  Temp 99.5 F (37.5 C) (Rectal)  Resp 16  SpO2 96%  Vital signs normal    Physical Exam  Nursing note and vitals reviewed. Constitutional: He appears well-developed and well-nourished.  Non-toxic appearance. He does not appear ill. No distress.       Slumped over in bed, slow to respond and when he does he speaks in a whisper  HENT:  Head: Normocephalic and atraumatic.  Right Ear: External ear normal.  Left Ear: External ear normal.  Nose: Nose normal. No mucosal edema or rhinorrhea.  Mouth/Throat: Mucous membranes are normal. No dental abscesses or uvula swelling.       Tongue dry  Eyes: Conjunctivae normal and EOM are normal. Pupils are equal, round, and reactive to light.  Neck: Normal range of motion and full passive range of motion without pain. Neck supple.  Cardiovascular: Normal rate, regular rhythm and normal heart sounds.  Exam reveals no gallop and no friction rub.   No murmur heard. Pulmonary/Chest: Effort normal and breath sounds normal. No respiratory distress. He has no wheezes. He has no rhonchi. He has no rales. He exhibits no tenderness and  no crepitus.  Abdominal: Soft. Normal appearance and bowel sounds are normal. He exhibits no distension. There is no tenderness. There is no rebound and no guarding.  Musculoskeletal: Normal range of motion. He exhibits no edema and no tenderness.       Weak in all extremities. Skin dry and cracked, sisters state he did have swelling in his legs  Neurological: He has normal strength. No cranial nerve deficit.       Sleepy, slow mentation  Skin: Skin is warm, dry and intact. No rash noted. No erythema. No pallor.  Psychiatric: His speech is normal and behavior is normal. His mood appears not anxious.       Flat affect    ED Course  Procedures (including critical care time)   Medications  0.9 %  sodium chloride infusion (  Intravenous Rate/Dose Change 03/24/12 1332)  sodium chloride 0.9 % bolus 700 mL (0 mL Intravenous Stopped 03/24/12 1331)   14:15 Recheck after bolus, sisters are feeding patient and he is much more alert and awake. Have discussed admission to obs for IV fluids.   15:25 Dr Irene Limbo admit to obs, med-surg, tean 5   Results for orders placed during the hospital encounter of 03/24/12  CBC WITH DIFFERENTIAL      Component Value Range   WBC 15.6 (*) 4.0 - 10.5 K/uL   RBC 2.59 (*) 4.22 - 5.81 MIL/uL   Hemoglobin 8.5 (*) 13.0 - 17.0 g/dL   HCT 45.4 (*) 09.8 - 11.9 %   MCV 100.4 (*) 78.0 - 100.0 fL   MCH 32.8  26.0 - 34.0 pg   MCHC 32.7  30.0 - 36.0 g/dL   RDW 14.7 (*) 82.9 - 56.2 %   Platelets 215  150 - 400 K/uL   Neutrophils Relative 85 (*) 43 - 77 %   Neutro Abs 13.2 (*) 1.7 - 7.7 K/uL   Lymphocytes Relative 5 (*) 12 - 46 %   Lymphs Abs 0.9  0.7 - 4.0 K/uL   Monocytes Relative 9  3 - 12 %   Monocytes Absolute 1.5 (*) 0.1 - 1.0 K/uL   Eosinophils Relative 0  0 - 5 %   Eosinophils Absolute 0.0  0.0 - 0.7 K/uL  Basophils Relative 0  0 - 1 %   Basophils Absolute 0.0  0.0 - 0.1 K/uL  COMPREHENSIVE METABOLIC PANEL      Component Value Range   Sodium 145  135 - 145  mEq/L   Potassium 4.0  3.5 - 5.1 mEq/L   Chloride 109  96 - 112 mEq/L   CO2 24  19 - 32 mEq/L   Glucose, Bld 116 (*) 70 - 99 mg/dL   BUN 61 (*) 6 - 23 mg/dL   Creatinine, Ser 9.60 (*) 0.50 - 1.35 mg/dL   Calcium 8.4  8.4 - 45.4 mg/dL   Total Protein 7.1  6.0 - 8.3 g/dL   Albumin 3.0 (*) 3.5 - 5.2 g/dL   AST 34  0 - 37 U/L   ALT 30  0 - 53 U/L   Alkaline Phosphatase 56  39 - 117 U/L   Total Bilirubin 1.2  0.3 - 1.2 mg/dL   GFR calc non Af Amer 27 (*) >90 mL/min   GFR calc Af Amer 32 (*) >90 mL/min  URINALYSIS, ROUTINE W REFLEX MICROSCOPIC      Component Value Range   Color, Urine YELLOW  YELLOW   APPearance CLEAR  CLEAR   Specific Gravity, Urine 1.017  1.005 - 1.030   pH 5.0  5.0 - 8.0   Glucose, UA NEGATIVE  NEGATIVE mg/dL   Hgb urine dipstick NEGATIVE  NEGATIVE   Bilirubin Urine NEGATIVE  NEGATIVE   Ketones, ur NEGATIVE  NEGATIVE mg/dL   Protein, ur NEGATIVE  NEGATIVE mg/dL   Urobilinogen, UA 1.0  0.0 - 1.0 mg/dL   Nitrite NEGATIVE  NEGATIVE   Leukocytes, UA NEGATIVE  NEGATIVE  TROPONIN I      Component Value Range   Troponin I <0.30  <0.30 ng/mL   Laboratory interpretation all normal except renal insufficiency, stable anemia  Laboratory results on January 23 showed BUN 33 creatinine 1.43 hemoglobin 8.1. Laboratory results on January 18 showed BUN 4, creatinine 2.34, hemoglobin 8.6    Dg Chest Portable 1 View  03/24/2012  *RADIOLOGY REPORT*  Clinical Data: Altered mental status  PORTABLE CHEST - 1 VIEW  Comparison: Prior chest x-ray 03/15/2012  Findings: The larger of the cardiopericardial silhouette is exaggerated by frontal portable technique.  There is pulmonary vascular congestion without overt edema.  Inspiratory volumes are lower than seen on the most recent prior study.  No focal airspace consolidation, pleural effusion or pneumothorax.  No focal osseous abnormality.  IMPRESSION:  1.  Lower inspiratory volumes and pulmonary vascular congestion without overt edema. 2.   Given portable technique, stable cardiomegaly   Original Report Authenticated By: Malachy Moan, M.D.      Date: 03/24/2012  Rate: 68  Rhythm: normal sinus rhythm  QRS Axis: left  Intervals: normal  ST/T Wave abnormalities: nonspecific T wave changes  Conduction Disutrbances:none  Narrative Interpretation:   Old EKG Reviewed: unchanged from 03/14/2012    1. Altered mental state   2. Renal insufficiency   3. Dehydration     Plan admission to observation  Devoria Albe, MD, FACEP   MDM          Ward Givens, MD 03/24/12 [phone]

## 2012-03-24 NOTE — ED Notes (Signed)
patient drinking chicken broth

## 2012-03-24 NOTE — ED Notes (Signed)
Doyce Loose Sister and [place], [phone]  Cell [phone]

## 2012-03-24 NOTE — ED Notes (Signed)
Admitting MD at bedside.

## 2012-03-24 NOTE — H&P (Signed)
History and Physical  [name] UJW:[id number] DOB: [birth year] DOA: 03/24/2012  Referring physician: Devoria Albe, MD PCP: Sandrea Hughs, MD   Chief Complaint: confused  HPI:  79 year old man presented from skilled nursing facility with lethargy. Initial evaluation was notable for acute encephalopathy, severe dehydration and acute renal failure.   Patient was discharged 1/23 by myself for short-term rehabilitation. At that time he had been hospitalized for rhabdomyolysis status post fall complicated by acute renal failure superimposed on chronic kidney disease. He was given fluids at that time, his diuretic was held and his condition rapidly improved. The patient was noted to participate poorly with therapy 1/24 and oral intake was very poor 1/25, 1/26, 1/27. He was noted to be dehydrated 1/27 and was given 2 L of half-normal saline. Today he was noted to be very lethargic and sent to the emergency department.  The patient provided no history secondary to his acute illness, history gathered from chart, sister and [name] at [place]. Sr. reports she saw the patient 1/24 and he was doing quite well at that time. 1/25 he became more lethargic and worse 1/26 and 1/27. Today she was notified that he was very lethargic. No acute illness or symptoms known by her. She does note that his blood pressure was 96/40 on 1/26. Per staff the patient participated poorly with therapy and had very poor oral intake. No symptoms or signs of illness were noted. Per staff he was not given Lasix.  In the emergency department the patient was noted to severely dehydrated, somnolent and treated with IV fluids with rapid improvement to the point he was able to eat a meal and interact. Afebrile, vital signs stable. Cranium elevated to 2.11, white blood cell count 15.6, hemoglobin 8.5. Urinalysis and chest x-ray were unremarkable.  Review of Systems:  Unobtainable from patient. As above per family and staff otherwise  unknown.   Past Medical History  Diagnosis Date  . Diverticulosis     divertic bleed presmed in  2010  . Gout   . Leg swelling     left  . Hyperlipidemia   . History of myocardial infarction   . Olecranon bursitis   . Renal failure   . HTN (hypertension)   . Anemia   . Healthcare maintenance   . Prostate cancer     Past Surgical History  Procedure Date  . R ia aneurysmectomy     scar is in RLQ  . Prostate cryoablation 2006  . Suprapubic catheter placement 2006    Social History:  reports that he has never smoked. He has never used smokeless tobacco. He reports that he does not drink alcohol. His drug history not on file.  No Known Allergies  Family History  Problem Relation Age of Onset  . Hypertension Brother   . Pancreatic cancer Brother   . Stroke Father   . Stroke Mother      Prior to Admission medications   Medication Sig Start Date End Date Taking? Authorizing Provider  acetaminophen (TYLENOL) 325 MG tablet Take 650 mg by mouth 2 (two) times daily as needed. For pain   Yes Historical Provider, MD  allopurinol (ZYLOPRIM) 300 MG tablet Take 300 mg by mouth daily.   Yes Historical Provider, MD  atenolol (TENORMIN) 100 MG tablet Take 50 mg by mouth daily.   Yes Historical Provider, MD  isosorbide-hydrALAZINE (BIDIL) 20-37.5 MG per tablet Take 1 tablet by mouth 2 (two) times daily. 03/19/12  Yes Standley Brooking, MD  Multiple Vitamins-Minerals (PROTEGRA PO) Take 1 capsule by mouth daily.     Yes Historical Provider, MD  docusate sodium 100 MG CAPS Take 100 mg by mouth 2 (two) times daily. 03/19/12   Standley Brooking, MD  polyethylene glycol [facility] Ethelene Hal) packet Take 17 g by mouth daily. 03/19/12   Standley Brooking, MD   Physical Exam: Filed Vitals:   03/24/12 1300 03/24/12 1330 03/24/12 1430 03/24/12 1500  BP: 135/63 133/58 119/47 134/54  Pulse: 66 70    Temp:      TempSrc:      Resp: 13 23 25 23   SpO2: 99% 97%      General:  Sleeping, but arouses  to voice, follows commands and answers simple questions Eyes: Pupils appear normal. Bilateral band keratopathy mid cornea right greater than left. Lids appear unremarkable. ENT: grossly normal hearing, lips, dry  Neck: no LAD, masses or thyromegaly Cardiovascular: RRR, no m/r/g. No LE edema. Respiratory: CTA bilaterally, no w/r/r. Normal respiratory effort. Abdomen: soft, ntnd Skin: chronic LE skin changes, wrinkled skin, very dry skin bilaterally Musculoskeletal: grossly normal tone BUE/BLE, moves all extremities to command. Globally weak. Psychiatric: Could not assessment or affect. Oriented to self, month, year, president. Not oriented to location. Neurologic: grossly non-focal.  Wt Readings from Last 3 Encounters:  03/17/12 89.5 kg (197 lb 5 oz)  03/04/12 88.451 kg (195 lb)  11/11/11 91.173 kg (201 lb)    Labs on Admission:  Basic Metabolic Panel:  Lab 03/24/12 1610 03/19/12 0629 03/18/12 0905  NA 145 149* 150*  K 4.0 4.3 3.4*  CL 109 113* 111  CO2 24 28 28   GLUCOSE 116* 107* 142*  BUN 61* 33* 34*  CREATININE 2.11* 1.43* 1.47*  CALCIUM 8.4 8.6 8.7  MG -- -- --  PHOS -- -- --    Liver Function Tests:  Lab 03/24/12 1203 03/19/12 0629  AST 34 108*  ALT 30 65*  ALKPHOS 56 50  BILITOT 1.2 1.4*  PROT 7.1 6.6  ALBUMIN 3.0* 3.2*   CBC:  Lab 03/24/12 1203 03/19/12 0629 03/18/12 0905  WBC 15.6* 12.7* 12.1*  NEUTROABS 13.2* -- --  HGB 8.5* 8.1* 8.2*  HCT 26.0* 25.1* 25.2*  MCV 100.4* 98.8 96.6  PLT 215 138* 138*    Cardiac Enzymes:  Lab 03/24/12 1216 03/19/12 0629 03/18/12 0905  CKTOTAL -- 1545* 2360*  CKMB -- -- --  CKMBINDEX -- -- --  TROPONINI <0.30 -- --   CBG:  Lab 03/19/12 0727 03/18/12 2005 03/18/12 1124 03/18/12 0725  GLUCAP 99 114* 107* 99     Radiological Exams on Admission: Dg Chest Portable 1 View  03/24/2012  *RADIOLOGY REPORT*  Clinical Data: Altered mental status  PORTABLE CHEST - 1 VIEW  Comparison: Prior chest x-ray 03/15/2012   Findings: The larger of the cardiopericardial silhouette is exaggerated by frontal portable technique.  There is pulmonary vascular congestion without overt edema.  Inspiratory volumes are lower than seen on the most recent prior study.  No focal airspace consolidation, pleural effusion or pneumothorax.  No focal osseous abnormality.  IMPRESSION:  1.  Lower inspiratory volumes and pulmonary vascular congestion without overt edema. 2.  Given portable technique, stable cardiomegaly   Original Report Authenticated By: Malachy Moan, M.D.     EKG: Independently reviewed. NSR, lateral t-wave inversion, consider ischemia.   Principal Problem:  *Acute renal failure Active Problems:  Anemia of chronic disease  HYPERTENSION  Left leg weakness  Dehydration  Acute encephalopathy  Chronic kidney disease   Assessment/Plan 1. Acute encephalopathy: Secondary to severe dehydration. Improved with IV fluids in the emergency department. Continue IV fluids. 2. Acute renal failure/chronic kidney disease stage III-IV: History and laboratory studies etiology, profound dehydration. Aggressive IV fluids. 3. Leukocytosis: Etiology and significance unclear. Seen on previous hospitalization, never returned to normal although there is no signs of infection at that time. Currently no signs of infection. Monitor. Consider peripheral blood smear. 4. Normocytic anemia: Stable. Likely secondary to kidney disease. 5. Chronic left leg weakness, pain: Followed by neurology as an outpatient. 6. Hypertension: Stable. 7. DVT prophylaxis: SCDs. Last admission had acute on chronic anemia requiring transfusion.  Code Status: Full per healthcare power of attorney Family Communication: Ronni Rumble and [place], [phone] Cell [phone] Disposition Plan/Anticipated LOS: Admit inpatient, 2-3 days  Time spent: 77 minutes  Brendia Sacks, MD  [facility] Pager [phone] 03/24/2012, 3:18 PM

## 2012-03-24 NOTE — ED Notes (Signed)
Pt ate 75% of regular meal tray

## 2012-03-25 DIAGNOSIS — R4182 Altered mental status, unspecified: Secondary | ICD-10-CM

## 2012-03-25 DIAGNOSIS — K5900 Constipation, unspecified: Secondary | ICD-10-CM

## 2012-03-25 DIAGNOSIS — E870 Hyperosmolality and hypernatremia: Secondary | ICD-10-CM

## 2012-03-25 LAB — BASIC METABOLIC PANEL
CO2: 24 mEq/L (ref 19–32)
Calcium: 8.2 mg/dL — ABNORMAL LOW (ref 8.4–10.5)
Chloride: 115 mEq/L — ABNORMAL HIGH (ref 96–112)
Glucose, Bld: 109 mg/dL — ABNORMAL HIGH (ref 70–99)
Potassium: 3.7 mEq/L (ref 3.5–5.1)
Sodium: 152 mEq/L — ABNORMAL HIGH (ref 135–145)

## 2012-03-25 LAB — CBC WITH DIFFERENTIAL/PLATELET
Eosinophils Absolute: 0.1 10*3/uL (ref 0.0–0.7)
Eosinophils Relative: 1 % (ref 0–5)
Hemoglobin: 8.1 g/dL — ABNORMAL LOW (ref 13.0–17.0)
Lymphocytes Relative: 8 % — ABNORMAL LOW (ref 12–46)
Lymphs Abs: 1 10*3/uL (ref 0.7–4.0)
MCH: 32.1 pg (ref 26.0–34.0)
MCV: 101.2 fL — ABNORMAL HIGH (ref 78.0–100.0)
Monocytes Relative: 7 % (ref 3–12)
Platelets: 212 10*3/uL (ref 150–400)
RBC: 2.52 MIL/uL — ABNORMAL LOW (ref 4.22–5.81)
WBC: 12.7 10*3/uL — ABNORMAL HIGH (ref 4.0–10.5)

## 2012-03-25 LAB — URINE CULTURE

## 2012-03-25 LAB — MRSA PCR SCREENING: MRSA by PCR: NEGATIVE

## 2012-03-25 MED ORDER — DOCUSATE SODIUM 100 MG PO CAPS
100.0000 mg | ORAL_CAPSULE | Freq: Two times a day (BID) | ORAL | Status: DC
  Administered 2012-03-25 – 2012-03-27 (×5): 100 mg via ORAL
  Filled 2012-03-25 (×6): qty 1

## 2012-03-25 MED ORDER — POLYETHYLENE GLYCOL 3350 17 G PO PACK
17.0000 g | PACK | Freq: Every day | ORAL | Status: DC
  Administered 2012-03-25 – 2012-03-27 (×3): 17 g via ORAL
  Filled 2012-03-25 (×3): qty 1

## 2012-03-25 MED ORDER — SODIUM CHLORIDE 0.45 % IV SOLN
INTRAVENOUS | Status: DC
  Administered 2012-03-25: 100 mL via INTRAVENOUS
  Administered 2012-03-25: 21:00:00 via INTRAVENOUS
  Administered 2012-03-26: 1000 mL via INTRAVENOUS
  Administered 2012-03-26 – 2012-03-27 (×3): via INTRAVENOUS

## 2012-03-25 MED ORDER — ENSURE COMPLETE PO LIQD
237.0000 mL | ORAL | Status: DC
  Administered 2012-03-25 – 2012-03-27 (×3): 237 mL via ORAL

## 2012-03-25 MED ORDER — SENNA 8.6 MG PO TABS
2.0000 | ORAL_TABLET | Freq: Every day | ORAL | Status: DC
  Administered 2012-03-25 – 2012-03-27 (×3): 17.2 mg via ORAL
  Filled 2012-03-25 (×3): qty 2

## 2012-03-25 NOTE — Care Management Note (Addendum)
    Page 1 of 1   03/27/2012     5:14:50 PM   CARE MANAGEMENT NOTE 03/27/2012  Patient:  [name], [name]   Account Number:  [id number]  Date Initiated:  03/25/2012  Documentation initiated by:  Letha Cape  Subjective/Objective Assessment:   dx acute renal failure  admit- from snf     Action/Plan:   pt eval- recs snf   Anticipated DC Date:  03/26/2012   Anticipated DC Plan:  SKILLED NURSING FACILITY  In-house referral  Clinical Social Worker      DC Planning Services  CM consult      Choice offered to / List presented to:             Status of service:  Completed, signed off Medicare Important Message given?   (If response is "NO", the following Medicare IM given date fields will be blank) Date Medicare IM given:   Date Additional Medicare IM given:    Discharge Disposition:  SKILLED NURSING FACILITY  Per UR Regulation:  Reviewed for med. necessity/level of care/duration of stay  If discussed at Long Length of Stay Meetings, dates discussed:    Comments:  03/27/12 17:13 Letha Cape RN, BSN [phone] patient for dc to snf today, did not go yesterday due to [facility] stated they had Keller breakout of the norovirus and could not take any more patients.  03/26/12 11:30 Letha Cape RN, BSN [phone] patient for dc to snf today, CSW following.  03/25/12 11:58 Letha Cape RN, BSN [phone] patient is from snf, plan is to return to the snf, CSW referral.

## 2012-03-25 NOTE — Progress Notes (Signed)
[facility]             Progress Note   Subjective: A little more alert.  Objective: Vital signs in last 24 hours: Temp:  [97.8 F (36.6 C)-99.5 F (37.5 C)] 98.8 F (37.1 C) (01/29 0500) Pulse Rate:  [66-75] 69  (01/29 0500) Resp:  [13-25] 22  (01/29 0500) BP: (119-173)/(47-77) 128/59 mmHg (01/29 0500) SpO2:  [96 %-100 %] 98 % (01/29 0500) Weight:  [77.5 kg (170 lb 13.7 oz)-79.6 kg (175 lb 7.8 oz)] 77.5 kg (170 lb 13.7 oz) (01/29 0500) Weight change:  Last BM Date:  (PTA)  Intake/Output from previous day: 01/28 0701 - 01/29 0700 In: 1305 [I.V.:1305] Out: 350 [Urine:350]     Physical Exam: General: Alert, awake, in no acute distress. HEENT: No bruits, no goiter. Heart: Regular rate and rhythm, without murmurs, rubs, gallops. Lungs: Clear to auscultation bilaterally. Abdomen: Soft, nontender, nondistended, positive bowel sounds. Extremities: No clubbing cyanosis or edema with positive pedal pulses.    Lab Results: Basic Metabolic Panel:  Basename 03/25/12 0440 03/24/12 1203  NA 152* 145  K 3.7 4.0  CL 115* 109  CO2 24 24  GLUCOSE 109* 116*  BUN 66* 61*  CREATININE 1.94* 2.11*  CALCIUM 8.2* 8.4  MG -- --  PHOS -- --   Liver Function Tests:  Basename 03/24/12 1203  AST 34  ALT 30  ALKPHOS 56  BILITOT 1.2  PROT 7.1  ALBUMIN 3.0*   CBC:  Basename 03/25/12 0440 03/24/12 1203  WBC 12.7* 15.6*  NEUTROABS 10.7* 13.2*  HGB 8.1* 8.5*  HCT 25.5* 26.0*  MCV 101.2* 100.4*  PLT 212 215   Cardiac Enzymes:  Basename 03/24/12 1216  CKTOTAL --  CKMB --  CKMBINDEX --  TROPONINI <0.30   Urinalysis:  Basename 03/24/12 1245  COLORURINE YELLOW  LABSPEC 1.017  PHURINE 5.0  GLUCOSEU NEGATIVE  HGBUR NEGATIVE  BILIRUBINUR NEGATIVE  KETONESUR NEGATIVE  PROTEINUR NEGATIVE  UROBILINOGEN 1.0  NITRITE NEGATIVE  LEUKOCYTESUR NEGATIVE    Recent Results (from the past 240 hour(s))  CULTURE, BLOOD (ROUTINE X 2)     Status: Normal  (Preliminary result)   Collection Time   03/24/12 12:15 PM      Component Value Range Status Comment   Specimen Description BLOOD HAND LEFT   Final    Special Requests BOTTLES DRAWN AEROBIC ONLY 7CC   Final    Culture  Setup Time 03/24/2012 21:55   Final    Culture     Final    Value:        BLOOD CULTURE RECEIVED NO GROWTH TO DATE CULTURE WILL BE HELD FOR 5 DAYS BEFORE ISSUING A FINAL NEGATIVE REPORT   Report Status PENDING   Incomplete   CULTURE, BLOOD (ROUTINE X 2)     Status: Normal (Preliminary result)   Collection Time   03/24/12 12:20 PM      Component Value Range Status Comment   Specimen Description BLOOD LEFT ANTECUBITAL   Final    Special Requests BOTTLES DRAWN AEROBIC ONLY 10CC   Final    Culture  Setup Time 03/24/2012 21:55   Final    Culture     Final    Value:        BLOOD CULTURE RECEIVED NO GROWTH TO DATE CULTURE WILL BE HELD FOR 5 DAYS BEFORE ISSUING A FINAL NEGATIVE REPORT   Report Status PENDING   Incomplete   MRSA PCR SCREENING     Status: Normal  Collection Time   03/24/12  8:35 PM      Component Value Range Status Comment   MRSA by PCR NEGATIVE  NEGATIVE Final     Studies/Results: Dg Chest Portable 1 View  03/24/2012  *RADIOLOGY REPORT*  Clinical Data: Altered mental status  PORTABLE CHEST - 1 VIEW  Comparison: Prior chest x-ray 03/15/2012  Findings: The larger of the cardiopericardial silhouette is exaggerated by frontal portable technique.  There is pulmonary vascular congestion without overt edema.  Inspiratory volumes are lower than seen on the most recent prior study.  No focal airspace consolidation, pleural effusion or pneumothorax.  No focal osseous abnormality.  IMPRESSION:  1.  Lower inspiratory volumes and pulmonary vascular congestion without overt edema. 2.  Given portable technique, stable cardiomegaly   Original Report Authenticated By: Malachy Moan, M.D.     Medications: Scheduled Meds:    . sodium chloride   Intravenous STAT  . antiseptic  oral rinse  15 mL Mouth Rinse BID  . atenolol  50 mg Oral Daily  . isosorbide-hydrALAZINE  1 tablet Oral BID   Continuous Infusions:    . sodium chloride     PRN Meds:.acetaminophen, acetaminophen  Assessment/Plan:  Principal Problem:  *Acute renal failure Active Problems:  Anemia of chronic disease  HYPERTENSION  Left leg weakness  Dehydration  Acute encephalopathy  Chronic kidney disease  Hypernatremia  Constipation   Acute Encephalopathy -Presumed 2/2 dehydration. -Seems to be improving. -Is currently eating breakfast and is about 50% done.  Hypernatremia -Likely 2/2 large amount of NS received. -Will switch to half NS. -Recheck in am.  Constipation -Add miralax, senokot, colace.  Acute on CKD Stage IV -Cr improving, altho still not back to his baseline. -Continue IVF today.  Code Status -Discussed with sister and POA Carney Bern. -Patient is a DNR. -Will update chart.  Disposition -Likely back to SNF in am.   Time spent coordinating care: 35 minutes   LOS: 1 day   [facility] Pager: [phone] 03/25/2012, 9:55 AM

## 2012-03-25 NOTE — Progress Notes (Signed)
INITIAL NUTRITION ASSESSMENT  DOCUMENTATION CODES Per approved criteria  -Not Applicable   INTERVENTION: 1. Ensure Complete po BID, each supplement provides 350 kcal and 13 grams of protein.  2. RD will continue to follow  NUTRITION DIAGNOSIS: Inadequate oral intake related to ? Unknown etiology as evidenced by weight loss, severe dehydration at admission.   Goal: PO intake to meet >/=90% estimated nutrition/hydration needs  Monitor:  PO intake, weight trends, I/O's, needs for nutrition support   Reason for Assessment: Malnutrition Screening Tool  79 y.o. male  Admitting Dx: Acute renal failure  ASSESSMENT: Pt admitted from SNF for dehydration and confusion. Pt recently d/c'd from here. From notes pt with decreased intake at SNF over the past several days. Once started on IV fluids mentation improved and was able to eat a meal.   Weight hx shows 31 lb weight loss (15.4% body weight) in the past 4-5 months. Severe weight loss. Some weight loss may be related to fluid status, and expect that weight will come up some more with the IV fluids.   Pt is alert this morning, states that he thinks he was eating and drinking well. Eating 3 meals daily, some snacks and the occasional Ensure supplement.  Question if this is accurate? No family in room to discuss.   If pt continues to be unable to maintain hydration needs with oral intake, may need to consider artificial nutrition/hydration to provide some/all of needs.    Height: Ht Readings from Last 1 Encounters:  03/24/12 5\' 10"  (1.778 m)    Weight: Wt Readings from Last 1 Encounters:  03/25/12 170 lb 13.7 oz (77.5 kg)    Ideal Body Weight: 166 lbs   % Ideal Body Weight: 102%  Wt Readings from Last 10 Encounters:  03/25/12 170 lb 13.7 oz (77.5 kg)  03/17/12 197 lb 5 oz (89.5 kg)  03/04/12 195 lb (88.451 kg)  11/11/11 201 lb (91.173 kg)  09/30/11 203 lb (92.08 kg)  07/01/11 209 lb 4.8 oz (94.938 kg)  04/01/11 207 lb 6.4  oz (94.076 kg)  02/15/11 202 lb 3.2 oz (91.717 kg)  11/06/10 202 lb (91.627 kg)  08/16/10 206 lb (93.441 kg)    Usual Body Weight: ~202 lbs from weight hx   % Usual Body Weight: 84%  BMI:  Body mass index is 24.52 kg/(m^2). WNL   Estimated Nutritional Needs: Kcal: 1800-2000 Protein: 55-65 gm  Fluid: 1.8-2 L/day   Skin: stage 2 pressure ulcer to sacrum  Diet Order: Cardiac Meal completion- none documented at this admission   EDUCATION NEEDS: -No education needs identified at this time   Intake/Output Summary (Last 24 hours) at 03/25/12 0951 Last data filed at 03/25/12 0600  Gross per 24 hour  Intake   1305 ml  Output    350 ml  Net    955 ml    Last BM: PTA   Labs:   Lab 03/25/12 0440 03/24/12 1203 03/19/12 0629  NA 152* 145 149*  K 3.7 4.0 4.3  CL 115* 109 113*  CO2 24 24 28   BUN 66* 61* 33*  CREATININE 1.94* 2.11* 1.43*  CALCIUM 8.2* 8.4 8.6  MG -- -- --  PHOS -- -- --  GLUCOSE 109* 116* 107*    CBG (last 3)  No results found for this basename: GLUCAP:3 in the last 72 hours  Scheduled Meds:   . sodium chloride   Intravenous STAT  . antiseptic oral rinse  15 mL Mouth Rinse BID  .  atenolol  50 mg Oral Daily  . isosorbide-hydrALAZINE  1 tablet Oral BID    Continuous Infusions:   . sodium chloride      Past Medical History  Diagnosis Date  . Diverticulosis     divertic bleed presmed in  2010  . Gout   . Leg swelling     left  . Hyperlipidemia   . History of myocardial infarction   . Olecranon bursitis   . Renal failure   . HTN (hypertension)   . Anemia   . Healthcare maintenance   . Prostate cancer     Past Surgical History  Procedure Date  . R ia aneurysmectomy     scar is in RLQ  . Prostate cryoablation 2006  . Suprapubic catheter placement 2006    Clarene Duke RD, LDN Pager [phone] After Hours pager [phone]

## 2012-03-26 DIAGNOSIS — E860 Dehydration: Secondary | ICD-10-CM

## 2012-03-26 LAB — CBC
HCT: 25.8 % — ABNORMAL LOW (ref 39.0–52.0)
MCV: 101.6 fL — ABNORMAL HIGH (ref 78.0–100.0)
RBC: 2.54 MIL/uL — ABNORMAL LOW (ref 4.22–5.81)
WBC: 11.9 10*3/uL — ABNORMAL HIGH (ref 4.0–10.5)

## 2012-03-26 LAB — BASIC METABOLIC PANEL
CO2: 24 mEq/L (ref 19–32)
Chloride: 115 mEq/L — ABNORMAL HIGH (ref 96–112)
Potassium: 3.6 mEq/L (ref 3.5–5.1)
Sodium: 149 mEq/L — ABNORMAL HIGH (ref 135–145)

## 2012-03-26 NOTE — Discharge Summary (Signed)
Physician Discharge Summary  Patient ID: [name] MRN: [id number] DOB/AGE: Mar 16, 1926 79 y.o.  Admit date: 03/24/2012 Discharge date: 03/26/2012  Primary Care Physician:  Sandrea Hughs, MD   Discharge Diagnoses:    Principal Problem:  *Acute renal failure Active Problems:  Anemia of chronic disease  HYPERTENSION  Left leg weakness  Dehydration  Acute encephalopathy  Chronic kidney disease  Hypernatremia  Constipation      Medication List     As of 03/26/2012 10:09 AM    TAKE these medications         acetaminophen 325 MG tablet   Commonly known as: TYLENOL   Take 650 mg by mouth 2 (two) times daily as needed. For pain      allopurinol 300 MG tablet   Commonly known as: ZYLOPRIM   Take 300 mg by mouth daily.      atenolol 100 MG tablet   Commonly known as: TENORMIN   Take 50 mg by mouth daily.      DSS 100 MG Caps   Take 100 mg by mouth 2 (two) times daily.      isosorbide-hydrALAZINE 20-37.5 MG per tablet   Commonly known as: BIDIL   Take 1 tablet by mouth 2 (two) times daily.      polyethylene glycol packet   Commonly known as: MIRALAX / GLYCOLAX   Take 17 g by mouth daily.      PROTEGRA PO   Take 1 capsule by mouth daily.         Disposition and Follow-up:  Will be discharged to SNF today in stable and improved condition.  Consults:  None   Significant Diagnostic Studies:  Dg Chest Portable 1 View  03/24/2012  *RADIOLOGY REPORT*  Clinical Data: Altered mental status  PORTABLE CHEST - 1 VIEW  Comparison: Prior chest x-ray 03/15/2012  Findings: The larger of the cardiopericardial silhouette is exaggerated by frontal portable technique.  There is pulmonary vascular congestion without overt edema.  Inspiratory volumes are lower than seen on the most recent prior study.  No focal airspace consolidation, pleural effusion or pneumothorax.  No focal osseous abnormality.  IMPRESSION:  1.  Lower inspiratory volumes and pulmonary vascular congestion  without overt edema. 2.  Given portable technique, stable cardiomegaly   Original Report Authenticated By: Malachy Moan, M.D.     Brief H and P: For complete details please refer to admission H and P, but in brief patient is an 79 year old man presented from skilled nursing facility with lethargy. Initial evaluation was notable for acute encephalopathy, severe dehydration and acute renal failure.In the emergency department the patient was noted to severely dehydrated, somnolent and treated with IV fluids with rapid improvement to the point he was able to eat a meal and interact. Afebrile, vital signs stable. Cranium elevated to 2.11, white blood cell count 15.6, hemoglobin 8.5. Urinalysis and chest x-ray were unremarkable. We were asked to admit him for further evaluation and management.      Hospital Course:  Principal Problem:  *Acute renal failure Active Problems:  Anemia of chronic disease  HYPERTENSION  Left leg weakness  Dehydration  Acute encephalopathy  Chronic kidney disease  Hypernatremia  Constipation  Acute Encephalopathy  -Presumed 2/2 dehydration.  -Resolved. Fully oriented today.  Hypernatremia  -Likely 2/2 large amount of NS received.  -Has improved to 148 at time of DC.  Acute on CKD Stage IV  -Cr almost back to his baseline of 1.6. -Is 1.77 at time of DC.  Time spent on Discharge: Greater than 30 minutes.  SignedChaya Jan [facility] Pager: [phone] 03/26/2012, 10:09 AM

## 2012-03-26 NOTE — Plan of Care (Signed)
Problem: Phase I Progression Outcomes Goal: Initial discharge plan identified Outcome: Completed/Met Date Met:  03/26/12 To return to [facility]

## 2012-03-27 LAB — BASIC METABOLIC PANEL
BUN: 49 mg/dL — ABNORMAL HIGH (ref 6–23)
CO2: 20 mEq/L (ref 19–32)
Chloride: 116 mEq/L — ABNORMAL HIGH (ref 96–112)
Creatinine, Ser: 1.39 mg/dL — ABNORMAL HIGH (ref 0.50–1.35)
Potassium: 3.8 mEq/L (ref 3.5–5.1)

## 2012-03-27 NOTE — Clinical Social Work Psychosocial (Signed)
Clinical Social Work Department BRIEF PSYCHOSOCIAL ASSESSMENT 03/27/2012  Patient:  [name], [name]     Account Number:  [id number]     Admit date:  03/24/2012  Clinical Social Worker:  Johnsie Cancel  Date/Time:  03/26/2012 02:00 PM  Referred by:  Physician  Date Referred:  03/25/2012 Referred for  SNF Placement   Other Referral:   Interview type:  Family Other interview type:    PSYCHOSOCIAL DATA Living Status:  FACILITY Admitted from facility:  [facility] Level of care:  Skilled Nursing Facility Primary support name:  Molly Maduro [phone]) Primary support relationship to patient:  S[place]LING Degree of support available:   Adequate. At patient's bedside.    CURRENT CONCERNS Current Concerns  Post-Acute Placement   Other Concerns:    SOCIAL WORK ASSESSMENT / PLAN CSW consulted re: patient being from facility. CSW met with patient and family at bedside, and provided support on hospital admission. Patient and family stated they wished him to return to [facility] when medically stable. CSW contacted [facility] to confirm he could return. CSW will continue to follow patient to assist in d/c.   Assessment/plan status:  Information/Referral to [facility] Other assessment/ plan:   Information/referral to community resources:    PATIENT'S/FAMILY'S RESPONSE TO PLAN OF CARE: Patient and family thanked CSW for providing support and assisting in d/c.   Lia Foyer, LCSWA [facility] Clinical Social Worker Contact #: [phone]

## 2012-03-27 NOTE — Progress Notes (Signed)
[name] discharged Skilled nursing facility per MD order.  Report called to receiving Jacki Cones, Charity fundraiser at [employer].    [place], Blessinger  Home Medication Instructions ZOX:[id number]   Printed on:03/27/12 1654  Medication Information                    Multiple Vitamins-Minerals (PROTEGRA PO) Take 1 capsule by mouth daily.             acetaminophen (TYLENOL) 325 MG tablet Take 650 mg by mouth 2 (two) times daily as needed. For pain           atenolol (TENORMIN) 100 MG tablet Take 50 mg by mouth daily.           allopurinol (ZYLOPRIM) 300 MG tablet Take 300 mg by mouth daily.           docusate sodium 100 MG CAPS Take 100 mg by mouth 2 (two) times daily.           isosorbide-hydrALAZINE (BIDIL) 20-37.5 MG per tablet Take 1 tablet by mouth 2 (two) times daily.           polyethylene glycol (MIRALAX / GLYCOLAX) packet Take 17 g by mouth daily.             Patients skin is clean, dry and with stage 2 to sacrum 2 cm x 1 cm. IV site discontinued and catheter remains intact. Site without signs and symptoms of complications. Dressing and pressure applied.  Patient transported on a stretcher by non emergent EMS,  no distress noted upon discharge.  Laural Benes, Ameenah Prosser C 03/27/2012 4:54 PM

## 2012-03-27 NOTE — Clinical Social Work Note (Addendum)
CSW consulted by MD re: d/c of patient. CSW contacted [facility] SNF to inform of patient's discharge. [facility] stated they have a norovirus outbreak and are unable to accept him back at this time. CSW will continue to follow patient to complete d/c.  ADDENDUM: [facility] unable to take patient back until Monday due to Norovirus. CSW will contact the patient's family to discuss alternative placement options.  Lia Foyer, LCSWA [facility] Clinical Social Worker Contact #: [phone]

## 2012-03-27 NOTE — Clinical Social Work Note (Addendum)
Clinical Social Work Department CLINICAL SOCIAL WORK PLACEMENT NOTE 03/27/2012  Patient:  [name], [name]  Account Number:  [id number] Admit date:  03/24/2012  Clinical Social Worker:  Johnsie Cancel  Date/time:  03/27/2012 09:57 AM  Clinical Social Work is seeking post-discharge placement for this patient at the following level of care:   SKILLED NURSING   (*CSW will update this form in Epic as items are completed)   03/27/2012  Patient/family provided with [facility] Department of Clinical Social Work's list of facilities offering this level of care within the geographic area requested by the patient (or if unable, by the patient's family).  03/27/2012  Patient/family informed of their freedom to choose among providers that offer the needed level of care, that participate in Medicare, Medicaid or managed care program needed by the patient, have an available bed and are willing to accept the patient.  03/27/2012  Patient/family informed of [facility]' ownership interest in [facility], as well as of the fact that they are under no obligation to receive care at this facility.  PASARR submitted to EDS  Previous number PASARR number received from EDS on previous number  FL2 transmitted to all facilities in geographic area requested by pt/family on  03/27/2012  Patient/family informed of bed offers received:  03/27/2012 Patient chooses bed at [facility]  Patient to be transferred to  on  [employer] on 03/27/2012 Patient to be transferred to facility by [facility]  The following physician request were entered in Epic:  Additional Comments:  Lia Foyer, LCSWA [facility] Clinical Social Worker Contact #: [phone]

## 2012-03-27 NOTE — Progress Notes (Signed)
Patient was discharged to SNF yesterday. However, his facility has a norovirus outbreak and they will not able to take him back until Monday. SW has started a new SNF search. We are hopeful for DC today. No new overnight events.  Peggye Pitt, MD [facility] Pager: [phone]

## 2012-03-27 NOTE — Clinical Social Work Note (Signed)
CSW was consulted to complete discharge of patient. Pt to transfer to [facility] today via [facility]. Facility and family are aware of d/c. D/C packet complete with chart copy, signed FL2, and signed hard Rx.  CSW signing off as no other CSW needs identified at this time.  Lia Foyer, LCSWA [facility] Clinical Social Worker Contact #: [phone]

## 2012-03-30 LAB — CULTURE, BLOOD (ROUTINE X 2)
Culture: NO GROWTH
Culture: NO GROWTH

## 2012-05-27 ENCOUNTER — Non-Acute Institutional Stay (SKILLED_NURSING_FACILITY): Payer: Medicare Other | Admitting: Nurse Practitioner

## 2012-05-27 ENCOUNTER — Encounter: Payer: Self-pay | Admitting: Nurse Practitioner

## 2012-05-27 DIAGNOSIS — M109 Gout, unspecified: Secondary | ICD-10-CM

## 2012-05-27 DIAGNOSIS — I10 Essential (primary) hypertension: Secondary | ICD-10-CM

## 2012-05-27 DIAGNOSIS — N189 Chronic kidney disease, unspecified: Secondary | ICD-10-CM

## 2012-05-27 DIAGNOSIS — R5381 Other malaise: Secondary | ICD-10-CM

## 2012-05-27 DIAGNOSIS — D638 Anemia in other chronic diseases classified elsewhere: Secondary | ICD-10-CM

## 2012-05-27 DIAGNOSIS — L8990 Pressure ulcer of unspecified site, unspecified stage: Secondary | ICD-10-CM

## 2012-05-27 NOTE — Progress Notes (Signed)
Patient ID: [name], male   DOB: 1926-07-25, 79 y.o.   MRN: [id number]  Chief Complaint: evaluation for discharge  HPI:  79 y/o male with hx of CKD stage 3, HTN, chronic leg edema fell on the floor of his house and was unable to get up. Denied syncopal episode. he was on the floor for several hours and brought to [facility] [place]. he was noted to have AKI on CKD with dehydration and rhabdomyolysis. imaging done showed no injury. Patient was hydrated and his creatinine improved. patient noted ot have rhabdomyolysis and given IV hydration. give weakness and fall he was evaluated by PT and recommended for SNF. He has currently been doing well in rehab and now stable for discharge to assisted living with home health services  Review of Systems:  Review of Systems  Constitutional: Negative for fever, chills and malaise/fatigue.  HENT: Negative.   Eyes: Negative.   Respiratory: Negative.  Negative for cough, sputum production and shortness of breath.   Cardiovascular: Negative for chest pain, palpitations, claudication and leg swelling.  Gastrointestinal: Negative for heartburn, nausea, vomiting, diarrhea and constipation.  Genitourinary: Negative for dysuria and frequency.  Musculoskeletal: Negative.   Skin: Negative for itching and rash.       Sore on left heel   Neurological: Negative.   Psychiatric/Behavioral: Negative for depression. The patient is not nervous/anxious and does not have insomnia.      Medications: Patient's Medications  New Prescriptions   No medications on file  Previous Medications   ACETAMINOPHEN (TYLENOL) 325 MG TABLET    Take 650 mg by mouth 2 (two) times daily as needed. For pain   ALLOPURINOL (ZYLOPRIM) 300 MG TABLET    Take 300 mg by mouth daily.   ATENOLOL (TENORMIN) 100 MG TABLET    Take 50 mg by mouth daily.   DOCUSATE SODIUM 100 MG CAPS    Take 100 mg by mouth 2 (two) times daily.   MULTIPLE VITAMINS-MINERALS (PROTEGRA PO)    Take 1 capsule by mouth  daily.     POLYETHYLENE GLYCOL (MIRALAX / GLYCOLAX) PACKET    Take 17 g by mouth daily.  Modified Medications   Modified Medication Previous Medication   ISOSORBIDE-HYDRALAZINE (BIDIL) 20-37.5 MG PER TABLET isosorbide-hydrALAZINE (BIDIL) 20-37.5 MG per tablet      Take 1 tablet by mouth 3 (three) times daily.    Take 1 tablet by mouth 2 (two) times daily.  Discontinued Medications   No medications on file     Physical Exam: Physical Exam  Constitutional: He appears well-developed and well-nourished. No distress.  HENT:  Head: Normocephalic and atraumatic.  Eyes: Conjunctivae and EOM are normal. Pupils are equal, round, and reactive to light.  Neck: Normal range of motion. Neck supple.  Cardiovascular: Normal rate, regular rhythm and normal heart sounds.   Pulmonary/Chest: Effort normal and breath sounds normal.  Abdominal: Soft. Bowel sounds are normal.  Musculoskeletal:  Uses wheelchair and walker to help with ambulation   Neurological: He is alert.  Skin: Skin is warm and dry. He is not diaphoretic.  Psychiatric: He has a normal mood and affect.    Filed Vitals:   05/26/12 0923  BP: 118/60  Pulse: 77  Temp: 97.1 F (36.2 C)  Resp: 20  Weight: 160 lb (72.576 kg)      Labs reviewed: Basic Metabolic Panel:  Recent Labs  40/98/11 0440 03/26/12 0545 03/27/12 0645  NA 152* 149* 146*  K 3.7 3.6 3.8  CL  115* 115* 116*  CO2 24 24 20   GLUCOSE 109* 99 92  BUN 66* 66* 49*  CREATININE 1.94* 1.77* 1.39*  CALCIUM 8.2* 8.3* 8.1*    Liver Function Tests:  Recent Labs  03/14/12 1748 03/19/12 0629 03/24/12 1203  AST 378* 108* 34  ALT 114* 65* 30  ALKPHOS 61 50 56  BILITOT 0.9 1.4* 1.2  PROT 7.1 6.6 7.1  ALBUMIN 3.7 3.2* 3.0*    CBC:  Recent Labs  09/30/11 1020  03/24/12 1203 03/25/12 0440 03/26/12 0545  WBC 6.9  < > 15.6* 12.7* 11.9*  NEUTROABS 5.1  --  13.2* 10.7*  --   HGB 10.6*  < > 8.5* 8.1* 8.2*  HCT 32.4*  < > 26.0* 25.5* 25.8*  MCV 97.4  < >  100.4* 101.2* 101.6*  PLT 132.0*  < > 215 212 206  < > = values in this interval not displayed.   Assessment/Plan HYPERTENSION Patient is stable; continue current regimen.  Chronic kidney disease Patient is stable; continue current regimen. Will need further monitoring with PCP. Current Cr is 1.12    GOUT No recent flares. Patient is stable; continue current regimen  Anemia of chronic disease Patient is stable; continue current regimen. Will monitor and make changes as necessary. hgb is 9.7 from 3/14 labs  Pressure ulcer, unspecified site(707.00) Cont dressing changes; pressure reduction- will have home health nursing monitor   Debility conts with some weakness and debility however pt is stable for discharge-will need PT/OT/Nursing per home health. No DME needed.  will need to follow up with PCP within 2 weeks.

## 2012-05-27 NOTE — Assessment & Plan Note (Signed)
No recent flares. Patient is stable; continue current regimen

## 2012-05-27 NOTE — Assessment & Plan Note (Signed)
Patient is stable; continue current regimen. 

## 2012-05-27 NOTE — Assessment & Plan Note (Signed)
Patient is stable; continue current regimen. Will need further monitoring with PCP. Current Cr is 1.12

## 2012-05-27 NOTE — Assessment & Plan Note (Signed)
conts with some weakness and debility however pt is stable for discharge-will need PT/OT/Nursing per home health. No DME needed.  will need to follow up with PCP within 2 weeks.

## 2012-05-27 NOTE — Assessment & Plan Note (Signed)
Cont dressing changes; pressure reduction- will have home health nursing monitor

## 2012-05-27 NOTE — Assessment & Plan Note (Signed)
Patient is stable; continue current regimen. Will monitor and make changes as necessary. hgb is 9.7 from 3/14 labs

## 2012-06-01 ENCOUNTER — Telehealth: Payer: Self-pay | Admitting: Internal Medicine

## 2012-06-01 MED ORDER — DOXYCYCLINE HYCLATE 100 MG PO TABS: 100.0000 mg | ORAL_TABLET | Freq: Two times a day (BID) | ORAL | Status: DC

## 2012-06-01 MED ORDER — COLLAGENASE 250 UNIT/GM EX OINT: TOPICAL_OINTMENT | Freq: Every day | CUTANEOUS | Status: DC

## 2012-06-01 NOTE — Telephone Encounter (Signed)
That is fine for wound care and santyl  Begin Doxycycline 100mg  Twice daily  For 10 days #20  If not improivng will need ov.  Please contact office for sooner follow up if symptoms do not improve or worsen or seek emergency care  Please send wound cx prior to beginning abx.  Fax results to our office  Please contact office for sooner follow up if symptoms do not improve or worsen or seek emergency care

## 2012-06-01 NOTE — Telephone Encounter (Signed)
Spoke with Eunice Blase and notified of recs per TP  She verbalized understanding Ordrs were signed by TP and faxed to [facility] attn [place] at [phone]

## 2012-06-01 NOTE — Telephone Encounter (Signed)
Spoke with Eunice Blase with [facility] She states that she is needing VO for wound care for pt's heel pressure ulcer She states that pt's heel has foul odor, and is drainage deep yellow fluis She requests order for abx for this, as well as santyl to be sent to [facility]- [phone] Please advise thanks!

## 2012-06-03 ENCOUNTER — Encounter: Payer: Medicare Other | Admitting: Adult Health

## 2012-06-04 ENCOUNTER — Telehealth: Payer: Self-pay | Admitting: Internal Medicine

## 2012-06-04 NOTE — Telephone Encounter (Signed)
LMTCB

## 2012-06-05 ENCOUNTER — Telehealth: Payer: Self-pay | Admitting: Internal Medicine

## 2012-06-05 NOTE — Telephone Encounter (Signed)
Called #. They have already closed. ? Type of form wcb monday

## 2012-06-05 NOTE — Telephone Encounter (Signed)
Lmtcbx2. Kelda Azad, CMA  

## 2012-06-08 NOTE — Telephone Encounter (Signed)
No changes needed 

## 2012-06-08 NOTE — Telephone Encounter (Signed)
LMTCB x 1 

## 2012-06-08 NOTE — Telephone Encounter (Signed)
i spoke with [name] and is aware. Nothing further was needed

## 2012-06-08 NOTE — Telephone Encounter (Signed)
Spoke with [employer] .  She wanted to see if we had received lab results on wound culture on pt.  She refaxed results and placed in Dr Thurston Hole look at.  Results was positive for staph.  Please advise if any order changes.

## 2012-06-09 NOTE — Telephone Encounter (Signed)
lmtcb x4 for denise. Advised tcb if anything further was needed as we have tried to call 4 times w/o response back. Will sign off per triage protocol

## 2012-06-15 ENCOUNTER — Encounter: Payer: Self-pay | Admitting: *Deleted

## 2012-06-16 ENCOUNTER — Telehealth: Payer: Self-pay | Admitting: Internal Medicine

## 2012-06-16 NOTE — Telephone Encounter (Signed)
I spoke with [name]. She stated pt BP on 4/20 was 150/94, 06/15/12 was 192/92 and today was 160/115. He takes atenolol 50 mg QD and isosorbide HCTZ 20-37.5 TID. His BP is high early mornings as well. Per [name] pt denies any symptoms no HA's, CP, changes in vision. Any recs. Please advise MW thanks  No Known Allergies

## 2012-06-16 NOTE — Telephone Encounter (Signed)
Should also be on minoxidil  If not on it start 5 mg bid If already on it double the dose

## 2012-06-16 NOTE — Telephone Encounter (Signed)
I spoke with [name] and is awarae. She needs this faxed over to [phone]. Pt is not currently on this. Nothing further needed

## 2012-06-18 DIAGNOSIS — R269 Unspecified abnormalities of gait and mobility: Secondary | ICD-10-CM

## 2012-06-18 DIAGNOSIS — N039 Chronic nephritic syndrome with unspecified morphologic changes: Secondary | ICD-10-CM

## 2012-06-18 DIAGNOSIS — L8992 Pressure ulcer of unspecified site, stage 2: Secondary | ICD-10-CM

## 2012-06-18 DIAGNOSIS — L89609 Pressure ulcer of unspecified heel, unspecified stage: Secondary | ICD-10-CM

## 2012-06-18 DIAGNOSIS — D631 Anemia in chronic kidney disease: Secondary | ICD-10-CM

## 2012-06-26 ENCOUNTER — Telehealth: Payer: Self-pay | Admitting: Internal Medicine

## 2012-06-26 ENCOUNTER — Encounter: Payer: Self-pay | Admitting: Internal Medicine

## 2012-06-26 NOTE — Telephone Encounter (Signed)
Spoke with nurse and notified of recs per MW OV with TP for 07/02/12 Nothing further needed

## 2012-06-26 NOTE — Telephone Encounter (Signed)
Will need ov before further adjustments but this pattern elevation isn't as significant as previous guidelines suggested when accounting for stiff pipes in an octagerian and can wait unitl ov to fine tune - with me or Tammy NP w/in 2 weeks is fine

## 2012-06-26 NOTE — Telephone Encounter (Signed)
I spoke with Aletha. She stated even with adding minoxidil his BP is still running high. 140/100 and 160/100. She stated any other recs in addition to his other BP meds. She is aware MW is out until next week. She stated that was fine. They have also been taking his BP before he takes his BP medications. She stated if we want them to take it after he takes they need an order for this and how long afterwards. Please advise MW thanks

## 2012-06-30 ENCOUNTER — Other Ambulatory Visit (INDEPENDENT_AMBULATORY_CARE_PROVIDER_SITE_OTHER): Payer: Medicare Other

## 2012-06-30 ENCOUNTER — Encounter: Payer: Self-pay | Admitting: Adult Health

## 2012-06-30 ENCOUNTER — Ambulatory Visit (INDEPENDENT_AMBULATORY_CARE_PROVIDER_SITE_OTHER): Payer: Medicare Other | Admitting: Adult Health

## 2012-06-30 VITALS — BP 164/94 | HR 62 | Temp 98.2°F | Ht 70.0 in | Wt 186.4 lb

## 2012-06-30 DIAGNOSIS — I10 Essential (primary) hypertension: Secondary | ICD-10-CM

## 2012-06-30 DIAGNOSIS — N189 Chronic kidney disease, unspecified: Secondary | ICD-10-CM

## 2012-06-30 DIAGNOSIS — D638 Anemia in other chronic diseases classified elsewhere: Secondary | ICD-10-CM

## 2012-06-30 LAB — CBC WITH DIFFERENTIAL/PLATELET
Basophils Absolute: 0 10*3/uL (ref 0.0–0.1)
Basophils Relative: 0.1 % (ref 0.0–3.0)
Eosinophils Absolute: 0.1 10*3/uL (ref 0.0–0.7)
Hemoglobin: 10.5 g/dL — ABNORMAL LOW (ref 13.0–17.0)
Lymphocytes Relative: 14.1 % (ref 12.0–46.0)
Lymphs Abs: 1.1 10*3/uL (ref 0.7–4.0)
MCHC: 33.7 g/dL (ref 30.0–36.0)
MCV: 93.5 fl (ref 78.0–100.0)
Monocytes Absolute: 0.8 10*3/uL (ref 0.1–1.0)
Neutro Abs: 6.1 10*3/uL (ref 1.4–7.7)
RDW: 17.8 % — ABNORMAL HIGH (ref 11.5–14.6)

## 2012-06-30 LAB — BASIC METABOLIC PANEL
CO2: 30 mEq/L (ref 19–32)
Calcium: 8.5 mg/dL (ref 8.4–10.5)
Chloride: 105 mEq/L (ref 96–112)
Glucose, Bld: 104 mg/dL — ABNORMAL HIGH (ref 70–99)
Sodium: 140 mEq/L (ref 135–145)

## 2012-06-30 MED ORDER — FUROSEMIDE 40 MG PO TABS: 40.0000 mg | ORAL_TABLET | Freq: Every day | ORAL | Status: DC

## 2012-06-30 MED ORDER — MINOXIDIL 10 MG PO TABS: 10.0000 mg | ORAL_TABLET | Freq: Two times a day (BID) | ORAL | Status: DC

## 2012-06-30 NOTE — Progress Notes (Signed)
Subjective:    Patient ID: [name], male    DOB: 1926/05/25     MRN: [id number]  HPI 17 yobm with difficult to control hypertension complicated by mild chronic renal insufficiency and tendency to chronic leg swelling L >> R - Developed Gout summer of 2009, saw gout doctor > Colchicine rx.   March 29, 2008 after hosp discharge for gib on asa, did not recur once asa stopped, GI svc saw pt and rec conservative rx for presumed diverticular bleed. No abd pain/wt loss.  rec maintain off asa   August 22, 2009--Returns for follow up and med review. He is doing well since last visit. No major gout flares since last visit.  Had venous dopplers last visit which were neg. Labs showed no sign. change in renal insufficiency w/ sCr at 1.7. Mild anemia, hgb sl down at 11.0. We reviewed meds today and updated his med calendar rec no change in rx   02/15/2011 f/u ov/Wert cc cpx, no new complaints, no change in chronic L > R leg swelling rec Increase your minoxidil so you take 10 mg one twice daily    04/01/2011 f/u ov/Wert for f/u hbp/cri/leg swelling  cc no change chronic L > R leg swelling, no cough or sob, tia or claudication >No changes   07/01/2011 Follow up/ NP rec Ok to take aspirin 81 mg enteric coated one daily with breakfast but stop at first sign of any bleeding   03/04/2012 f/u ov/Wert cc  No change L lower ext weakness x one year, eval by [facility]> spinal eval in progress.  No increase in leg swelling, sob, cough. >>Increased minoxidil 10mg  Twice daily    06/30/2012 Acute OV  Pt presents from NH due to persistently elevated b/p , avg 160-180 systolic.  Pt was admitted earlier this year 1/18 for a fall at home with secondary rhabdomyolysis (unable to get up from floor-down for prolonged time) . Labs showed acute on chronic renal failure .He was given IVF and discharged to [facility]. At that time b/p meds were changed to Norvasc and BiDil. He was readmitted with anemia-felt to be chronic, dehydration,   worsening renal failure and encephalopathy. Improved  with IVF.   He says he has been doing well in AL .  B/p have been slowly increasing over last several weeks.  Bibil was stopped couple of weeks ago due to interaction with minoxidil.  Previously prior to admit in January he was maintained on  Minoxidil, lasix, atenolol and clonidine.  Cloniidine, lasix was stopped during admit in January.   No chest pain, fever, headache, visual changes Legs have swelling on/off , worse in evening.     Past Medical History:  Diverticulosis..............................................................Marland KitchenPatterson  - Colonoscopy 02/28/06  - LGIB 03/15/2008 while on asa, dc'd  Gout  - Uric acid 10 01/2009 > allopurinol started March 21, 2009 > recheck 08/16/2010  7.8  Left leg swelling onset 05/2009  - Venous dopplers Jul 11, 2009 >>>neg  Hyperlipidemia  - Target LDL < 70 (hbp, PVD)   OLECRANON BURSITIIS........................................................Marland KitchenYates  Renal failure baseline creat 1.5  Hypertension  PROSTATE CA......................................................................Marland KitchenWrenn  - Cryotherapy 02/2004  ANEMIA  -08/22/09 hbg 11.0, iron studies> 11% sat  L leg weakness ......................................................................Marland Kitchen [facility] HEALTH MAINTENANCE..........................................................Marland KitchenWert  - DT 03/04/2012  - Pneumovax 10/2002 (age 55 last shot needed) - CPX 03/04/2012   Complex Med regimen  -Meds calendar adjust-February 15, 2008 , August 22, 2009 , 07/01/2011    Family History:  HBP brother  Pancreatic  ca brother  CVA father and ? mother   Social History:  Never smoker  No ETOH  Retired, stays busy with church activities, no aerobics  Single   ROS;  Reviewed , neg except in HPI          Objective:   Physical Exam Ambulatory healthy appearing elderly bm in no acute distress.  wt 206 March 29, 2008 > 201 March 21, 2009 > 203 Jul 11, 2009 > 204 October 16, 2009 > 205 February 08, 2010 > 02/15/2011 202  > 04/01/2011  207 >209 07/01/2011 > 03/04/2012  195>186 06/30/2012  HEENT: nl dentition, turbinates, and orophanx. EAC clear with coarse ear hair- no wax seen TMs normal  Neck without JVD/Nodes/TM ,+right carotid bruit , 2+pulses  Lungs clear to A and P bilaterally without cough on insp or exp maneuvers  RRR grade 1-2 SM ,  bilateral mod L, mild pitting R  Abd soft and benign with nl excursion in the supine position. No bruits or organomegaly  Ext warm without calf tenderness, cyanosis clubbing. Neg Homan's  MS Kyphotic spine, moderate, arthritic changes of the hands. w no obvious joint deformity, restriction,  Mod stooped over gait    CXR  03/04/2012 :   Stable cardiomegaly. No active disease.  CXR 1/281/4  Lower inspiratory volumes and pulmonary vascular congestion without overt edema.       Assessment & Plan:

## 2012-06-30 NOTE — Assessment & Plan Note (Signed)
Check cbc 

## 2012-06-30 NOTE — Patient Instructions (Addendum)
Increase Minoxidil 10mg  Twice daily   Add Furosemide 40mg  daily  Continue on Atenolol 50mg  daily  Low salt diet  I will call with labs  follow up 2 weeks with b/p log with Dr. [name]   Check blood pressure daily and keep log.

## 2012-06-30 NOTE — Assessment & Plan Note (Addendum)
Uncontrolled on present regimen Remain off Bidil  Labs w/ bmet and cbc   Plan  Increase Minoxidil 10mg  Twice daily   Add Furosemide 40mg  daily  Continue on Atenolol 50mg  daily  Low salt diet  I will call with labs  follow up 2 weeks with b/p log with Dr. [name]   Check blood pressure daily and keep log.

## 2012-06-30 NOTE — Assessment & Plan Note (Signed)
Check labs w/ bmet

## 2012-07-02 NOTE — Progress Notes (Signed)
Quick Note:  Called spoke with patient, advised of lab results / recs as stated by TP. Pt verbalized his understanding and denied any questions. ______ 

## 2012-07-13 ENCOUNTER — Telehealth: Payer: Self-pay | Admitting: Internal Medicine

## 2012-07-13 ENCOUNTER — Ambulatory Visit (INDEPENDENT_AMBULATORY_CARE_PROVIDER_SITE_OTHER)
Admission: RE | Admit: 2012-07-13 | Discharge: 2012-07-13 | Disposition: A | Discharge status: Home / Self Care | Payer: Medicare Other | Source: Ambulatory Visit | Attending: Internal Medicine | Admitting: Internal Medicine

## 2012-07-13 ENCOUNTER — Ambulatory Visit (INDEPENDENT_AMBULATORY_CARE_PROVIDER_SITE_OTHER): Payer: Medicare Other | Admitting: Internal Medicine

## 2012-07-13 ENCOUNTER — Other Ambulatory Visit (INDEPENDENT_AMBULATORY_CARE_PROVIDER_SITE_OTHER): Payer: Medicare Other

## 2012-07-13 ENCOUNTER — Encounter: Payer: Self-pay | Admitting: Internal Medicine

## 2012-07-13 VITALS — BP 180/90 | HR 61 | Temp 97.9°F | Ht 70.0 in | Wt 190.0 lb

## 2012-07-13 DIAGNOSIS — I10 Essential (primary) hypertension: Secondary | ICD-10-CM

## 2012-07-13 DIAGNOSIS — R609 Edema, unspecified: Secondary | ICD-10-CM

## 2012-07-13 DIAGNOSIS — N189 Chronic kidney disease, unspecified: Secondary | ICD-10-CM

## 2012-07-13 LAB — BASIC METABOLIC PANEL
BUN: 23 mg/dL (ref 6–23)
Calcium: 8.8 mg/dL (ref 8.4–10.5)
Chloride: 104 mEq/L (ref 96–112)
Creatinine, Ser: 1.4 mg/dL (ref 0.4–1.5)
GFR: 62.07 mL/min (ref >60.00)

## 2012-07-13 MED ORDER — FUROSEMIDE 40 MG PO TABS: ORAL_TABLET | ORAL | Status: AC

## 2012-07-13 MED ORDER — MINOXIDIL 10 MG PO TABS: ORAL_TABLET | ORAL | Status: AC

## 2012-07-13 NOTE — Telephone Encounter (Signed)
I spoke w/ [name] and ave VO. Nothing further was needed

## 2012-07-13 NOTE — Progress Notes (Signed)
Subjective:    Patient ID: [name], male    DOB: [birth year]     MRN: [id number]  HPI 79 yobm with difficult to control hypertension complicated by mild chronic renal insufficiency and tendency to chronic leg swelling L >> R - Developed Gout summer of 2009, saw gout doctor > [facility] rx.   March 29, 2008 after hosp discharge for gib on asa, did not recur once asa stopped, GI svc saw pt and rec conservative rx for presumed diverticular bleed. No abd pain/wt loss.  rec maintain off asa   August 22, 2009--Returns for follow up and med review. He is doing well since last visit. No major gout flares since last visit.  Had venous dopplers last visit which were neg. Labs showed no sign. change in renal insufficiency w/ sCr at 1.7. Mild anemia, hgb sl down at 11.0. We reviewed meds today and updated his med calendar rec no change in rx   02/15/2011 f/u ov/Swayzee Wadley cc cpx, no new complaints, no change in chronic L > R leg swelling rec Increase your minoxidil so you take 10 mg one twice daily    04/01/2011 f/u ov/Arianne Klinge for f/u hbp/cri/leg swelling  cc no change chronic L > R leg swelling, no cough or sob, tia or claudication >No changes   07/01/2011 Follow up/ NP rec Ok to take aspirin 81 mg enteric coated one daily with breakfast but stop at first sign of any bleeding   03/04/2012 f/u ov/Shailah Gibbins cc  No change L lower ext weakness x one year, eval by [facility]> spinal eval in progress.  No increase in leg swelling, sob, cough. >>Increased minoxidil 10mg  Twice daily    06/30/2012 Acute OV  Pt presents from NH due to persistently elevated b/p , avg 160-180 systolic.  Pt was admitted earlier this year 1/18 for a fall at home with secondary rhabdomyolysis (unable to get up from floor-down for prolonged time) . Labs showed acute on chronic renal failure .He was given IVF and discharged to [facility]. At that time b/p meds were changed to Norvasc and BiDil. He was readmitted with anemia-felt to be chronic, dehydration,   worsening renal failure and encephalopathy. Improved  with IVF.   He says he has been doing well in [place] .  B/p have been slowly increasing over last several weeks.  Bibil was stopped couple of weeks ago due to interaction with minoxidil.  Previously prior to admit in January he was maintained on  Minoxidil, lasix, atenolol and clonidine.  Cloniidine, lasix was stopped during admit in January.  Legs have swelling on/off , worse in evening.  rec Increase Minoxidil 10mg  Twice daily   Add Furosemide 40mg  daily  Continue on Atenolol 50mg  daily  Low salt diet     07/13/2012 f/u ov/Noris Kulinski re hbp/ leg swelling, verified meds changed as above Chief Complaint  Patient presents with  . Follow-up    Pt still concerned about increased BP readings. He is also c/o the swelling in feet- esp left not improving.    no sob, cp,  ha tia or claudication  No obvious daytime variabilty or assoc chronic cough or cp or chest tightness, subjective wheeze overt sinus or hb symptoms. No unusual exp hx     Sleeping ok without nocturnal  or early am exacerbation  of respiratory  C/o's.. Also denies any obvious fluctuation of symptoms with weather or environmental changes or other aggravating or alleviating factors except as outlined above   Current Medications, Allergies, Past  Medical History, Past Surgical History, Family History, and Social History were reviewed in [employer] record.  ROS  The following are not active complaints unless bolded sore throat, dysphagia, dental problems, itching, sneezing,  nasal congestion or excess/ purulent secretions, ear ache,   fever, chills, sweats, unintended wt loss, pleuritic or exertional cp, hemoptysis,  orthopnea pnd or leg swelling, presyncope, palpitations, heartburn, abdominal pain, anorexia, nausea, vomiting, diarrhea  or change in bowel or urinary habits, change in stools or urine, dysuria,hematuria,  rash, arthralgias, visual complaints, headache,  numbness weakness or ataxia or problems with walking or coordination,  change in mood/affect or memory.         Past Medical History:  Diverticulosis..............................................................Marland KitchenPatterson  - Colonoscopy 02/28/06  - LGIB 03/15/2008 while on asa, dc'd  Gout  - Uric acid 10 01/2009 > allopurinol started March 21, 2009 > recheck 08/16/2010  7.8  Left leg swelling onset 05/2009  - Venous dopplers Jul 11, 2009 >>>neg  Hyperlipidemia  - Target LDL < 70 (hbp, PVD)   OLECRANON BURSITIIS........................................................Marland KitchenYates  Renal failure baseline creat 1.5  Hypertension  PROSTATE CA......................................................................Marland KitchenWrenn  - Cryotherapy 02/2004  ANEMIA  -08/22/09 hbg 11.0, iron studies> 11% sat  L leg weakness ......................................................................Marland Kitchen [facility] HEALTH MAINTENANCE..........................................................Marland KitchenWert  - DT 03/04/2012  - Pneumovax 10/2002 (age 78 last shot needed) - CPX 03/04/2012   Complex Med regimen  -Meds calendar adjust-February 15, 2008 , August 22, 2009 , 07/01/2011    Family History:  HBP brother  Pancreatic ca brother  CVA father and ? mother   Social History:  Never smoker  No ETOH  Retired, stays busy with church activities, no aerobics  Single             Objective:   Physical Exam Ambulatory chronically ill elderly bm in no acute distress.  wt 206 March 29, 2008 > 201 March 21, 2009 > 203 Jul 11, 2009 > 204 October 16, 2009 > 205 February 08, 2010 > 02/15/2011 202  > 04/01/2011  207 >209 07/01/2011 > 03/04/2012  195>186 06/30/2012 > 07/13/2012  190 HEENT: nl dentition, turbinates, and orophanx. EAC clear with coarse ear hair- no wax seen TMs normal  Neck without JVD/Nodes/TM ,+right carotid bruit , 2+pulses  Lungs clear to A and P bilaterally without cough on insp or exp maneuvers  RRR grade 1-2 SM ,  bilateral severe  tense pitting edema L > R  Abd soft and benign with nl excursion in the supine position. No bruits or organomegaly  Ext warm without calf tenderness, cyanosis clubbing. Neg Homan's  MS Kyphotic spine, moderate, arthritic changes of the hands. w no obvious joint deformity, restriction,  Mod stooped over gait    CXR  07/13/2012 :  Enlargement the cardiopericardial silhouette is stable. No new or acute interval findings.        Assessment & Plan:

## 2012-07-13 NOTE — Patient Instructions (Addendum)
Please remember to go to the lab and x-ray department downstairs for your tests - we will call you with the results when they are available.  TEDS/ elevation will help    Return to see Tammy NP in 2 weeks with an updated accurate version of your present medications   Add aldactone next

## 2012-07-13 NOTE — Assessment & Plan Note (Addendum)
Lab Results  Component Value Date   CREATININE 1.4 07/13/2012   CREATININE 1.3 06/30/2012   CREATININE 1.39* 03/27/2012    - Baseline creat 1.6 01/2011 Adequate control on present rx, reviewed - ok to push furosemide then add aldactone

## 2012-07-13 NOTE — Telephone Encounter (Signed)
Ok to give VO for PT

## 2012-07-13 NOTE — Telephone Encounter (Signed)
Inadvertently closed

## 2012-07-13 NOTE — Telephone Encounter (Signed)
Spoke with Koleen Nimrod- needs VO to continue PT for gait and strengthening x 2 more wks Please advise thanks!

## 2012-07-13 NOTE — Assessment & Plan Note (Addendum)
- -   Venous dopplers   Jul 11, 2009 >>>neg   Chronic problem, no evidence of chf or tamponade on minoxidil  Will add TEDS/ increase diuresis/ elevate

## 2012-07-13 NOTE — Assessment & Plan Note (Signed)
Not Adequate control on present rx, reviewed options> he is adequately beta blocked by pulse and should have adequate vasodilation so will push diuretics harder and add aldactone next if refractory

## 2012-07-14 ENCOUNTER — Telehealth: Payer: Self-pay | Admitting: *Deleted

## 2012-07-14 NOTE — Telephone Encounter (Signed)
Finally spoke with Tonya and gave VO to increase lasix to 40 mg 2 tablets each am She verbalized understanding

## 2012-07-14 NOTE — Telephone Encounter (Signed)
Message copied by Christen Butter on Tue Jul 14, 2012  9:12 AM ------      Message from: Sandrea Hughs B      Created: Mon Jul 13, 2012  1:40 PM       Send order in for increase furosemide 40 mg two each am ------

## 2012-07-14 NOTE — Progress Notes (Signed)
Quick Note:  Pt aware ______ 

## 2012-07-14 NOTE — Telephone Encounter (Addendum)
Called and spoke with the Nurse at [facility] and tried to give VO to increase lasix to 40 mg 2 each am  Was advised that the only nurse who takes VO's is Tonya, and I will have to call her back  I called back an hour later and still could not get a hold of her, was on hold for approx 6 min WCB later

## 2012-07-15 ENCOUNTER — Ambulatory Visit: Payer: Medicare Other | Admitting: Internal Medicine

## 2012-07-27 ENCOUNTER — Ambulatory Visit (INDEPENDENT_AMBULATORY_CARE_PROVIDER_SITE_OTHER): Payer: Medicare Other | Admitting: Adult Health

## 2012-07-27 ENCOUNTER — Encounter: Payer: Self-pay | Admitting: Adult Health

## 2012-07-27 VITALS — BP 164/84 | HR 67 | Temp 98.0°F | Ht 66.0 in | Wt 193.0 lb

## 2012-07-27 DIAGNOSIS — I10 Essential (primary) hypertension: Secondary | ICD-10-CM

## 2012-07-27 MED ORDER — SPIRONOLACTONE 25 MG PO TABS: 25.0000 mg | ORAL_TABLET | Freq: Two times a day (BID) | ORAL | Status: DC

## 2012-07-27 NOTE — Assessment & Plan Note (Addendum)
Elevated b/p not at goal.   Plan  Add Aldactone 25mg  Twice daily  .  Continue on low salt diet.  Legs elevated.  follow up Dr. [name]  In 4 weeks and As needed

## 2012-07-27 NOTE — Progress Notes (Signed)
Subjective:    Patient ID: [name], male    DOB: [birth year]     MRN: [id number]  HPI 31 yobm with difficult to control hypertension complicated by mild chronic renal insufficiency and tendency to chronic leg swelling L >> R - Developed Gout summer of 2009, saw gout doctor > [facility] rx.   March 29, 2008 after hosp discharge for gib on asa, did not recur once asa stopped, GI svc saw pt and rec conservative rx for presumed diverticular bleed. No abd pain/wt loss.  rec maintain off asa   August 22, 2009--Returns for follow up and med review. He is doing well since last visit. No major gout flares since last visit.  Had venous dopplers last visit which were neg. Labs showed no sign. change in renal insufficiency w/ sCr at 1.7. Mild anemia, hgb sl down at 11.0. We reviewed meds today and updated his med calendar rec no change in rx   02/15/2011 f/u ov/Wert cc cpx, no new complaints, no change in chronic L > R leg swelling rec Increase your minoxidil so you take 10 mg one twice daily    04/01/2011 f/u ov/Wert for f/u hbp/cri/leg swelling  cc no change chronic L > R leg swelling, no cough or sob, tia or claudication >No changes   07/01/2011 Follow up/ NP rec Ok to take aspirin 81 mg enteric coated one daily with breakfast but stop at first sign of any bleeding   03/04/2012 f/u ov/Wert cc  No change L lower ext weakness x one year, eval by [facility]> spinal eval in progress.  No increase in leg swelling, sob, cough. >>Increased minoxidil 10mg  Twice daily    06/30/2012 Acute OV  Pt presents from NH due to persistently elevated b/p , avg 160-180 systolic.  Pt was admitted earlier this year 1/18 for a fall at home with secondary rhabdomyolysis (unable to get up from floor-down for prolonged time) . Labs showed acute on chronic renal failure .He was given IVF and discharged to [facility]. At that time b/p meds were changed to Norvasc and BiDil. He was readmitted with anemia-felt to be chronic, dehydration,   worsening renal failure and encephalopathy. Improved  with IVF.   He says he has been doing well in AL .  B/p have been slowly increasing over last several weeks.  Bibil was stopped couple of weeks ago due to interaction with minoxidil.  Previously prior to admit in January he was maintained on  Minoxidil, lasix, atenolol and clonidine.  Cloniidine, lasix was stopped during admit in January.  Legs have swelling on/off , worse in evening.  rec Increase Minoxidil 10mg  Twice daily   Add Furosemide 40mg  daily  Continue on Atenolol 50mg  daily  Low salt diet     07/13/2012 f/u ov/Wert re hbp/ leg swelling, verified meds changed as above Chief Complaint  Patient presents with  . Follow-up    Pt still concerned about increased BP readings. He is also c/o the swelling in feet- esp left not improving.    no sob, cp,  ha tia or claudication >>labs done    07/27/2012 Follow up and Med review  Returns for follow up for HTN.  Difficult to control b/p over last several weeks.  Had several b/p changes w/ recent admission to NH.  4 weeks ago , Minoxidil increased Twice daily  And lasix increased 40mg  daily .  B/p remains elevated ~160 .  Labs showed improved scr at 1.3.  Leg swelling slightly improved  with lasix.  He says he feels good, no headache, visual/speech changes.  No chest pain or dyspnea.  Verified his MAR from NH and updated our list.     Past Medical History:  Diverticulosis..............................................................Marland KitchenPatterson  - Colonoscopy 02/28/06  - LGIB 03/15/2008 while on asa, dc'd  Gout  - Uric acid 10 01/2009 > allopurinol started March 21, 2009 > recheck 08/16/2010  7.8  Left leg swelling onset 05/2009  - Venous dopplers Jul 11, 2009 >>>neg  Hyperlipidemia  - Target LDL < 70 (hbp, PVD)   OLECRANON BURSITIIS........................................................Marland KitchenYates  Renal failure baseline creat 1.5  Hypertension  PROSTATE  CA......................................................................Marland KitchenWrenn  - Cryotherapy 02/2004  ANEMIA  -08/22/09 hbg 11.0, iron studies> 11% sat  L leg weakness ......................................................................Marland Kitchen [facility] HEALTH MAINTENANCE..........................................................Marland KitchenWert  - DT 03/04/2012  - Pneumovax 10/2002 (age 79 last shot needed) - CPX 03/04/2012   Complex Med regimen  -Meds calendar adjust-February 15, 2008 , August 22, 2009 , 07/01/2011    Family History:  HBP brother  Pancreatic ca brother  CVA father and ? mother   Social History:  Never smoker  No ETOH  Retired, stays busy with church activities, no aerobics  Single             Objective:   Physical Exam Ambulatory chronically ill elderly bm in no acute distress.  wt 206 March 29, 2008 > 201 March 21, 2009 > 203 Jul 11, 2009 > 204 October 16, 2009 > 205 February 08, 2010 > 02/15/2011 202  > 04/01/2011  207 >209 07/01/2011 > 03/04/2012  195>186 06/30/2012 > 07/13/2012  190>193 07/27/2012  HEENT: nl dentition, turbinates, and orophanx. EAC clear with coarse ear hair- no wax seen TMs normal  Neck without JVD/Nodes/TM ,+right carotid bruit , 2+pulses  Lungs clear to A and P bilaterally without cough on insp or exp maneuvers  RRR grade 1-2 SM ,  bilateral 1-2+L > R  Abd soft and benign with nl excursion in the supine position. No bruits or organomegaly  Ext warm without calf tenderness, cyanosis clubbing. Neg Homan's  MS Kyphotic spine, moderate, arthritic changes of the hands. w no obvious joint deformity, restriction,  Mod stooped over gait    CXR  07/13/2012 :  Enlargement the cardiopericardial silhouette is stable. No new or acute interval findings.        Assessment & Plan:

## 2012-07-27 NOTE — Patient Instructions (Addendum)
Add Aldactone 25mg  Twice daily  .  Continue on low salt diet.  Legs elevated.  follow up Dr. [name]  In 4 weeks and As needed

## 2012-07-28 ENCOUNTER — Telehealth: Payer: Self-pay | Admitting: Internal Medicine

## 2012-07-28 NOTE — Telephone Encounter (Signed)
Called spoke with Archie Patten w/ [facility] who stated that the facility needs a written order for the separated meds in Bidil (isosorbide-hydralazine) as this medication does not exist   ???   Asked Tonya for the name and number to the pharmacy so that I can clarify this. [facility] pharmacy > [phone]. Called pharmacy and spoke with Irving Burton who reports that the Bidil is not an issue - they are needing verification that pt may continue to take the Minoxidil w/ the Hydralazine in the Bidil as these two meds are similar.  Order clarification needs to be faxed to [facility] @ [phone] attn Tonya  Dr [name] please advise, thank you.

## 2012-07-28 NOTE — Telephone Encounter (Signed)
ATC the nurse back  Was put on hold for over 5 min WCB

## 2012-07-28 NOTE — Telephone Encounter (Signed)
I have faxed this phone note over with the order below to fax number given. Carron Curie, CMA   Ok to use both Minoxidil and Bidil per Dr. [name]. Carron Curie, CMA

## 2012-07-28 NOTE — Telephone Encounter (Signed)
Spoke with [name] I have reviewed current meds with her and faxed list of meds to her per her request Nothing further needed

## 2012-07-28 NOTE — Telephone Encounter (Signed)
It is redundant but won't hurt him so ok to use both

## 2012-07-29 ENCOUNTER — Telehealth: Payer: Self-pay | Admitting: Internal Medicine

## 2012-07-29 MED ORDER — ISOSORB DINITRATE-HYDRALAZINE 20-37.5 MG PO TABS: 1.0000 | ORAL_TABLET | Freq: Three times a day (TID) | ORAL | Status: DC

## 2012-07-29 NOTE — Telephone Encounter (Signed)
Dr. [name], theres a paper for you to sign for this in your look at.  Will you pls sign?  Thank you.

## 2012-07-29 NOTE — Telephone Encounter (Signed)
done

## 2012-07-29 NOTE — Telephone Encounter (Signed)
Bidil rx and order to d/c isosorbide signed by MW and faxed to [place] at [phone]. Also, they faxed over a form on 6/3/14stating pt has gained 10.4 lbs and asking for further recs. This was also addressed by MW with instructions to elevate pt's leg and see TP on Friday and was faxed back to [place]. [name] is aware of recs and aware this along with orders have been faxed.  She had further concerns on recs because pt was just seen by TP on Monday -- ? Does pt really need to be seen again on Friday? Also, when fax was sent to our office, pt had not yet started the aldactone 25 mg bid that was rec on Monday's OV and the Bidil has been d/c'd since 5/4.  [name] would like to ensure MW is aware of this information.  Pt will restart the Bidil once they receive medication from pharmacy and has received approx 1.5 days of aldactone.  I spoke with Dr. [name] regarding this.  He would like pt to cont with the aldactone at 25 mg bid, keep legs elevated, and return to see him in one wk and take the bidil as ordered.  I have faxed these recs to [facility].  We have scheduled pt to see MW on June 10 at 4:15.  [name] is aware to send current MAR and any questions along with pt to OV.  She verbalized understanding of all instructions and recs, is aware bidil rx, d/c order for isosorbide, and most current recs on their fax regarding pt's wt gain has been faxed to her.  She voiced no further questions or concerns at this time.  ** I have placed fax along with orders in MWs scan folder.

## 2012-07-29 NOTE — Telephone Encounter (Signed)
Ok for both

## 2012-07-29 NOTE — Telephone Encounter (Signed)
I spoke with [name]. She needs printed RX faxed to her for BIDIL faxed to her. Also needs D/C order for isosorbide dinitrate 30 mg faxed to her as well. This has been placed in WM look at for signature. Please advise MW thanks

## 2012-08-04 ENCOUNTER — Other Ambulatory Visit (INDEPENDENT_AMBULATORY_CARE_PROVIDER_SITE_OTHER): Payer: Medicare Other

## 2012-08-04 ENCOUNTER — Ambulatory Visit (INDEPENDENT_AMBULATORY_CARE_PROVIDER_SITE_OTHER): Payer: Medicare Other | Admitting: Internal Medicine

## 2012-08-04 ENCOUNTER — Encounter: Payer: Self-pay | Admitting: Internal Medicine

## 2012-08-04 VITALS — BP 140/82 | HR 79 | Temp 97.7°F | Ht 70.0 in | Wt 192.4 lb

## 2012-08-04 DIAGNOSIS — I10 Essential (primary) hypertension: Secondary | ICD-10-CM

## 2012-08-04 LAB — BASIC METABOLIC PANEL
Calcium: 9.1 mg/dL (ref 8.4–10.5)
GFR: 43.9 mL/min — ABNORMAL LOW (ref >60.00)
Potassium: 4.4 mEq/L (ref 3.5–5.1)
Sodium: 142 mEq/L (ref 135–145)

## 2012-08-04 NOTE — Progress Notes (Signed)
Subjective:    Patient ID: [name], male    DOB: [birth year]     MRN: [id number]  HPI 79 yobm with difficult to control hypertension complicated by mild chronic renal insufficiency and tendency to chronic leg swelling L >> R - Developed Gout summer of 2009, saw gout doctor > [facility] rx.   March 29, 2008 after hosp discharge for gib on asa, did not recur once asa stopped, GI svc saw pt and rec conservative rx for presumed diverticular bleed. No abd pain/wt loss.  rec maintain off asa   August 22, 2009--Returns for follow up and med review. He is doing well since last visit. No major gout flares since last visit.  Had venous dopplers last visit which were neg. Labs showed no sign. change in renal insufficiency w/ sCr at 1.7. Mild anemia, hgb sl down at 11.0. We reviewed meds today and updated his med calendar rec no change in rx   02/15/2011 f/u ov/Sharice Harriss cc cpx, no new complaints, no change in chronic L > R leg swelling rec Increase your minoxidil so you take 10 mg one twice daily    04/01/2011 f/u ov/Lucresia Simic for f/u hbp/cri/leg swelling  cc no change chronic L > R leg swelling, no cough or sob, tia or claudication >No changes   07/01/2011 Follow up/ NP rec Ok to take aspirin 81 mg enteric coated one daily with breakfast but stop at first sign of any bleeding   03/04/2012 f/u ov/Treysean Petruzzi cc  No change L lower ext weakness x one year, eval by [facility]> spinal eval in progress.  No increase in leg swelling, sob, cough. >>Increased minoxidil 10mg  Twice daily    06/30/2012 Acute OV  Pt presents from NH due to persistently elevated b/p , avg 160-180 systolic.  Pt was admitted earlier this year 1/18 for a fall at home with secondary rhabdomyolysis (unable to get up from floor-down for prolonged time) . Labs showed acute on chronic renal failure .He was given IVF and discharged to [facility]. At that time b/p meds were changed to Norvasc and BiDil. He was readmitted with anemia-felt to be chronic, dehydration,   worsening renal failure and encephalopathy. Improved  with IVF.   He says he has been doing well in AL .  B/p have been slowly increasing over last several weeks.  Bibil was stopped couple of weeks ago due to interaction with minoxidil.  Previously prior to admit in January he was maintained on  Minoxidil, lasix, atenolol and clonidine.  Cloniidine, lasix was stopped during admit in January.  Legs have swelling on/off , worse in evening.  rec Increase Minoxidil 10mg  Twice daily   Add Furosemide 40mg  daily  Continue on Atenolol 50mg  daily  Low salt diet     07/13/2012 f/u ov/Igor Bishop re hbp/ leg swelling, verified meds changed as above Chief Complaint  Patient presents with  . Follow-up    Pt still concerned about increased BP readings. He is also c/o the swelling in feet- esp left not improving.    no sob, cp,  ha tia or claudication >>labs done    07/27/2012 Follow up and Med review  Returns for follow up for HTN.  Difficult to control b/p over last several weeks.  Had several b/p changes w/ recent admission to NH.  4 weeks ago , Minoxidil increased Twice daily  And lasix increased 40mg  daily .  B/p remains elevated ~160 .  Labs showed improved scr at 1.3.  Leg swelling slightly improved  with lasix.  He says he feels good, no headache, visual/speech changes.  No chest pain or dyspnea.  Verified his MAR from NH and updated our list.  rec Add Aldactone 25mg  Twice daily  .  Continue on low salt diet  08/04/2012 f/u ov/Lacole Komorowski  Chief Complaint  Patient presents with  . Hypertension    Pt states bp is up and down states that leg swelling is going down.    does not have TEDS yet though ordered.  No sob either at hs or with activity, though very frail now and sedentary  No obvious daytime variabilty or assoc chronic cough or cp or chest tightness, subjective wheeze overt sinus or hb symptoms. No unusual exp hx or h/o childhood pna/ asthma or premature birth to his knowledge.    Sleeping ok without nocturnal  or early am exacerbation  of respiratory  c/o's or need for noct saba. Also denies any obvious fluctuation of symptoms with weather or environmental changes or other aggravating or alleviating factors except as outlined above    ROS  The following are not active complaints unless bolded sore throat, dysphagia, dental problems, itching, sneezing,  nasal congestion or excess/ purulent secretions, ear ache,   fever, chills, sweats, unintended wt loss, pleuritic or exertional cp, hemoptysis,  orthopnea pnd or leg swelling, presyncope, palpitations, heartburn, abdominal pain, anorexia, nausea, vomiting, diarrhea  or change in bowel or urinary habits, change in stools or urine, dysuria,hematuria,  rash, arthralgias, visual complaints, headache, numbness weakness or ataxia or problems with walking or coordination,  change in mood/affect or memory.         Past Medical History:  Diverticulosis..............................................................Marland KitchenPatterson  - Colonoscopy 02/28/06  - LGIB 03/15/2008 while on asa, dc'd  Gout  - Uric acid 10 01/2009 > allopurinol started March 21, 2009 > recheck 08/16/2010  7.8  Left leg swelling onset 05/2009  - Venous dopplers Jul 11, 2009 >>>neg  Hyperlipidemia  - Target LDL < 70 (hbp, PVD)   OLECRANON BURSITIIS........................................................Marland KitchenYates  Renal failure baseline creat 1.5  Hypertension  PROSTATE CA......................................................................Marland KitchenWrenn  - Cryotherapy 02/2004  ANEMIA  -08/22/09 hbg 11.0, iron studies> 11% sat  L leg weakness ......................................................................Marland Kitchen [facility] HEALTH MAINTENANCE..........................................................Marland KitchenWert  - DT 03/04/2012  - Pneumovax 10/2002 (age 52 last shot needed) - CPX 03/04/2012   Complex Med regimen  -Meds calendar adjust-February 15, 2008 , August 22, 2009 , 07/01/2011     Family History:  HBP brother  Pancreatic ca brother  CVA father and ? mother   Social History:  Never smoker  No ETOH  Retired, stays busy with church activities, no aerobics  Single             Objective:   Physical Exam Ambulatory chronically ill elderly bm in no acute distress.  wt 206 March 29, 2008 > 201 March 21, 2009 > 203 Jul 11, 2009 > 204 October 16, 2009 > 205 February 08, 2010 > 02/15/2011 202  > 04/01/2011  207 >209 07/01/2011 > 03/04/2012  195>186 06/30/2012 > 07/13/2012  190>193 07/27/2012 >  192 08/04/2012  HEENT: nl dentition, turbinates, and orophanx. EAC clear with coarse ear hair- no wax seen TMs normal  Neck without JVD/Nodes/TM ,+right carotid bruit , 2+pulses  Lungs clear to A and P bilaterally without cough on insp or exp maneuvers  RRR grade 1-2 SM ,  bilateral  2+L > R  Abd soft and benign with nl excursion in the supine position. No bruits or organomegaly  Ext warm without  calf tenderness, cyanosis clubbing. Neg Homan's  MS Kyphotic spine, moderate, arthritic changes of the hands. w no obvious joint deformity, restriction,  Mod stooped over gait    CXR  07/13/2012 :  Enlargement the cardiopericardial silhouette is stable. No new or acute interval findings.        Assessment & Plan:

## 2012-08-04 NOTE — Patient Instructions (Addendum)
Please remember to go to the lab  department downstairs for your tests - we will call you with the results when they are available.  TEDS should be worn anytime not in bed and legs should be elevated above the level of the heart if at all possible when supine but there is not need for extra time in bed.  Please schedule a follow up office visit in 4 weeks, sooner if needed to see Tammy (change the July 1st appt)

## 2012-08-04 NOTE — Assessment & Plan Note (Signed)
Adequate control on present rx, reviewed limited options > no changes needed

## 2012-08-04 NOTE — Assessment & Plan Note (Signed)
-   Baseline creat 1.6 01/2011  Lab Results  Component Value Date   CREATININE 1.9* 08/04/2012   CREATININE 1.4 07/13/2012   CREATININE 1.3 06/30/2012    Hopefully will plateau at around 2 with goal of controlling but not eliminating edema with diuretics plus TEDS plus elevation  Recheck bmet monthly

## 2012-08-05 ENCOUNTER — Telehealth: Payer: Self-pay | Admitting: Internal Medicine

## 2012-08-05 NOTE — Telephone Encounter (Signed)
Minimal concern as he could just as easily buy some knee high socks and do just as well

## 2012-08-05 NOTE — Telephone Encounter (Signed)
Clydie Braun returned call > she is with the Division of Health Service Regulation, a dept under DHHS for the state.  Per Clydie Braun, her dept regulates the laws and rules for Assisted Living facilities and she specifically follows/researches the implementation of orders and the incidental findings contained therein.    Clydie Braun is requesting to know if- or how- concerned MW was to find out that pt still has not received his TED hose ordered on 5.19.14.  She stated she is not asking MW to become concerned, just his level of concern from 6.10.14 ov.  Clydie Braun requests a call back regarding this.  Dr [name] please advise, thank you.

## 2012-08-05 NOTE — Telephone Encounter (Signed)
I spoke with karen and made aware of recs. Nothing further was needed

## 2012-08-05 NOTE — Telephone Encounter (Signed)
LMOM TCB x1 for [name]

## 2012-08-06 NOTE — Progress Notes (Signed)
Quick Note:  Called, spoke with pt. Informed him of lab results and recs per Dr. [name]. He verbalized understanding and voiced no further question or concerns at this time. ______

## 2012-08-25 ENCOUNTER — Ambulatory Visit: Payer: Medicare Other | Admitting: Internal Medicine

## 2012-09-03 ENCOUNTER — Ambulatory Visit (INDEPENDENT_AMBULATORY_CARE_PROVIDER_SITE_OTHER): Payer: Medicare Other | Admitting: Adult Health

## 2012-09-03 ENCOUNTER — Encounter: Payer: Self-pay | Admitting: Adult Health

## 2012-09-03 VITALS — BP 150/80 | HR 74 | Temp 98.8°F | Ht 70.0 in | Wt 196.8 lb

## 2012-09-03 DIAGNOSIS — I10 Essential (primary) hypertension: Secondary | ICD-10-CM

## 2012-09-03 DIAGNOSIS — L8990 Pressure ulcer of unspecified site, unspecified stage: Secondary | ICD-10-CM

## 2012-09-03 NOTE — Assessment & Plan Note (Signed)
Cont dressing per AL wound team.

## 2012-09-03 NOTE — Assessment & Plan Note (Signed)
Cont to follow  Avoid nephrotoxins if possible- ie NSAIDS

## 2012-09-03 NOTE — Patient Instructions (Addendum)
Continue on current regimen  Low salt diet .  Follow up Dr. [name]  In 3 months and As needed

## 2012-09-03 NOTE — Assessment & Plan Note (Signed)
Difficult to control HTN  Cont on current regimen  Low salt diet

## 2012-09-03 NOTE — Progress Notes (Signed)
Subjective:    Patient ID: [name], male    DOB: 18-Jul-1926     MRN: [id number]  HPI 79 yobm with difficult to control hypertension complicated by mild chronic renal insufficiency and tendency to chronic leg swelling L >> R - Developed Gout summer of 2009, saw gout doctor > [facility] rx.   March 29, 2008 after hosp discharge for gib on asa, did not recur once asa stopped, GI svc saw pt and rec conservative rx for presumed diverticular bleed. No abd pain/wt loss.  rec maintain off asa   August 22, 2009--Returns for follow up and med review. He is doing well since last visit. No major gout flares since last visit.  Had venous dopplers last visit which were neg. Labs showed no sign. change in renal insufficiency w/ sCr at 1.7. Mild anemia, hgb sl down at 11.0. We reviewed meds today and updated his med calendar rec no change in rx   02/15/2011 f/u ov/Wert cc cpx, no new complaints, no change in chronic L > R leg swelling rec Increase your minoxidil so you take 10 mg one twice daily    04/01/2011 f/u ov/Wert for f/u hbp/cri/leg swelling  cc no change chronic L > R leg swelling, no cough or sob, tia or claudication >No changes   07/01/2011 Follow up/ NP rec Ok to take aspirin 81 mg enteric coated one daily with breakfast but stop at first sign of any bleeding   03/04/2012 f/u ov/Wert cc  No change L lower ext weakness x one year, eval by [facility]> spinal eval in progress.  No increase in leg swelling, sob, cough. >>Increased minoxidil 10mg  Twice daily    06/30/2012 Acute OV  Pt presents from NH due to persistently elevated b/p , avg 160-180 systolic.  Pt was admitted earlier this year 1/18 for a fall at home with secondary rhabdomyolysis (unable to get up from floor-down for prolonged time) . Labs showed acute on chronic renal failure .He was given IVF and discharged to [facility]. At that time b/p meds were changed to Norvasc and BiDil. He was readmitted with anemia-felt to be chronic, dehydration,   worsening renal failure and encephalopathy. Improved  with IVF.   He says he has been doing well in AL .  B/p have been slowly increasing over last several weeks.  Bibil was stopped couple of weeks ago due to interaction with minoxidil.  Previously prior to admit in January he was maintained on  Minoxidil, lasix, atenolol and clonidine.  Cloniidine, lasix was stopped during admit in January.  Legs have swelling on/off , worse in evening.  rec Increase Minoxidil 10mg  Twice daily   Add Furosemide 40mg  daily  Continue on Atenolol 50mg  daily  Low salt diet     07/13/2012 f/u ov/Wert re hbp/ leg swelling, verified meds changed as above Chief Complaint  Patient presents with  . Follow-up    Pt still concerned about increased BP readings. He is also c/o the swelling in feet- esp left not improving.    no sob, cp,  ha tia or claudication >>labs done    07/27/2012 Follow up and Med review  Returns for follow up for HTN.  Difficult to control b/p over last several weeks.  Had several b/p changes w/ recent admission to NH.  4 weeks ago , Minoxidil increased Twice daily  And lasix increased 40mg  daily .  B/p remains elevated ~160 .  Labs showed improved scr at 1.3.  Leg swelling slightly improved  with lasix.  He says he feels good, no headache, visual/speech changes.  No chest pain or dyspnea.  Verified his MAR from NH and updated our list.  rec Add Aldactone 25mg  Twice daily  .  Continue on low salt diet  08/04/2012 f/u ov/Wert  Chief Complaint  Patient presents with  . Hypertension    Pt states bp is up and down states that leg swelling is going down.    does not have TEDS yet though ordered.  No sob either at hs or with activity, though very frail now and sedentary  >no changes   09/03/2012 Follow up  Pt returns for follow up . Says he is doing well.  He is participating in exercise program . Really likes [employer].  Tolerating b/p meds. B/p `150/80 today.  No chest pain,  orthopnea, increased LE edema.  Does have small heel ulcer, treated at AL. Says it is almost healed.  Has good appetite with no n/v.    ROS  The following are not active complaints unless bolded sore throat, dysphagia, dental problems, itching, sneezing,  nasal congestion or excess/ purulent secretions, ear ache,   fever, chills, sweats, unintended wt loss, pleuritic or exertional cp, hemoptysis,  orthopnea pnd or leg swelling, presyncope, palpitations, heartburn, abdominal pain, anorexia, nausea, vomiting, diarrhea  or change in bowel or urinary habits, change in stools or urine, dysuria,hematuria,  rash, arthralgias, visual complaints, headache, numbness weakness or ataxia or problems with walking or coordination,  change in mood/affect or memory.         Past Medical History:  Diverticulosis..............................................................Marland KitchenPatterson  - Colonoscopy 02/28/06  - LGIB 03/15/2008 while on asa, dc'd  Gout  - Uric acid 10 01/2009 > allopurinol started March 21, 2009 > recheck 08/16/2010  7.8  Left leg swelling onset 05/2009  - Venous dopplers Jul 11, 2009 >>>neg  Hyperlipidemia  - Target LDL < 70 (hbp, PVD)   OLECRANON BURSITIIS........................................................Marland KitchenYates  Renal failure baseline creat 1.5  Hypertension  PROSTATE CA......................................................................Marland KitchenWrenn  - Cryotherapy 02/2004  ANEMIA  -08/22/09 hbg 11.0, iron studies> 11% sat  L leg weakness ......................................................................Marland Kitchen [facility] HEALTH MAINTENANCE..........................................................Marland KitchenWert  - DT 03/04/2012  - Pneumovax 10/2002 (age 20 last shot needed) - CPX 03/04/2012   Complex Med regimen  -Meds calendar adjust-February 15, 2008 , August 22, 2009 , 07/01/2011    Family History:  HBP brother  Pancreatic ca brother  CVA father and ? mother   Social History:  Never smoker  No ETOH   Retired, stays busy with church activities, no aerobics  Single             Objective:   Physical Exam Ambulatory chronically ill elderly bm in no acute distress.  wt 206 March 29, 2008 > 201 March 21, 2009 > 203 Jul 11, 2009 > 204 October 16, 2009 > 205 February 08, 2010 > 02/15/2011 202  > 04/01/2011  207 >209 07/01/2011 > 03/04/2012  195>186 06/30/2012 > 07/13/2012  190>193 07/27/2012 >  192 08/04/2012 >196 09/03/2012  HEENT: nl dentition, turbinates, and orophanx. EAC clear with coarse ear hair- no wax seen TMs normal  Neck without JVD/Nodes/TM ,+right carotid bruit , 2+pulses  Lungs clear to A and P bilaterally without cough on insp or exp maneuvers  RRR grade 1-2 SM ,  bilateral  1+L > R edema,  Abd soft and benign with nl excursion in the supine position. No bruits or organomegaly  Ext warm without calf tenderness, cyanosis clubbing TEDS hose, heel dressing  intact-clean/dry MS Kyphotic spine, moderate, arthritic changes of the hands. w no obvious joint deformity, restriction,  Mod stooped over gait    CXR  07/13/2012 :  Enlargement the cardiopericardial silhouette is stable. No new or acute interval findings.        Assessment & Plan:

## 2012-09-07 ENCOUNTER — Telehealth: Payer: Self-pay | Admitting: Internal Medicine

## 2012-09-07 DIAGNOSIS — R5381 Other malaise: Secondary | ICD-10-CM

## 2012-09-07 DIAGNOSIS — R29898 Other symptoms and signs involving the musculoskeletal system: Secondary | ICD-10-CM

## 2012-09-07 DIAGNOSIS — L8990 Pressure ulcer of unspecified site, unspecified stage: Secondary | ICD-10-CM

## 2012-09-07 DIAGNOSIS — N189 Chronic kidney disease, unspecified: Secondary | ICD-10-CM

## 2012-09-07 DIAGNOSIS — R609 Edema, unspecified: Secondary | ICD-10-CM

## 2012-09-07 DIAGNOSIS — C61 Malignant neoplasm of prostate: Secondary | ICD-10-CM

## 2012-09-07 NOTE — Telephone Encounter (Signed)
Spoke with [place]  She states that she is needing order sent to [facility] for hospital bed Needs to include letter stating need for this request and send the last ov note Please advise specifics on why he needs the bed and I will be happy to take care of this, thanks!

## 2012-09-07 NOTE — Telephone Encounter (Signed)
Needs home health care evaluation by [facility] to determine all his needs

## 2012-09-08 NOTE — Telephone Encounter (Signed)
Order placed. Jennifer Castillo, CMA  

## 2012-12-07 ENCOUNTER — Ambulatory Visit (INDEPENDENT_AMBULATORY_CARE_PROVIDER_SITE_OTHER): Payer: Medicare Other | Admitting: Internal Medicine

## 2012-12-07 ENCOUNTER — Encounter: Payer: Self-pay | Admitting: Internal Medicine

## 2012-12-07 VITALS — BP 130/84 | HR 81 | Temp 98.4°F | Ht 68.0 in | Wt 208.0 lb

## 2012-12-07 DIAGNOSIS — N189 Chronic kidney disease, unspecified: Secondary | ICD-10-CM

## 2012-12-07 DIAGNOSIS — I10 Essential (primary) hypertension: Secondary | ICD-10-CM

## 2012-12-07 NOTE — Patient Instructions (Signed)
No need to follow up here unless you move to a lower level of care where you resume responsibility for your medications  Be sure you get the flu shot this year.  Should you go to a level of care where you take your medications, then you would need to see Tammy NP to regroup with all medications in hand  - otherwise follow up here is as needed

## 2012-12-07 NOTE — Progress Notes (Signed)
Subjective:    Patient ID: [name], male    DOB: August 15, 1926     MRN: [id number]  HPI 79 yobm with difficult to control hypertension complicated by mild chronic renal insufficiency and tendency to chronic leg swelling L >> R - Developed Gout summer of 2009, saw gout doctor > Colchicine rx.   March 29, 2008 after hosp discharge for gib on asa, did not recur once asa stopped, GI svc saw pt and rec conservative rx for presumed diverticular bleed. No abd pain/wt loss.  rec maintain off asa   August 22, 2009--Returns for follow up and med review. He is doing well since last visit. No major gout flares since last visit.  Had venous dopplers last visit which were neg. Labs showed no sign. change in renal insufficiency w/ sCr at 1.7. Mild anemia, hgb sl down at 11.0. We reviewed meds today and updated his med calendar rec no change in rx   02/15/2011 f/u ov/Kenita Bines cc cpx, no new complaints, no change in chronic L > R leg swelling rec Increase your minoxidil so you take 10 mg one twice daily    04/01/2011 f/u ov/Aylssa Herrig for f/u hbp/cri/leg swelling  cc no change chronic L > R leg swelling, no cough or sob, tia or claudication >No changes   07/01/2011 Follow up/ NP rec Ok to take aspirin 81 mg enteric coated one daily with breakfast but stop at first sign of any bleeding   03/04/2012 f/u ov/Shawna Kiener cc  No change L lower ext weakness x one year, eval by [facility]> spinal eval in progress.  No increase in leg swelling, sob, cough. >>Increased minoxidil 10mg  Twice daily    06/30/2012 Acute OV  Pt presents from NH due to persistently elevated b/p , avg 160-180 systolic.  Pt was admitted earlier this year 1/18 for a fall at home with secondary rhabdomyolysis (unable to get up from floor-down for prolonged time) . Labs showed acute on chronic renal failure .He was given IVF and discharged to [facility]. At that time b/p meds were changed to Norvasc and BiDil. He was readmitted with anemia-felt to be chronic, dehydration,   worsening renal failure and encephalopathy. Improved  with IVF.   He says he has been doing well in AL .  B/p have been slowly increasing over last several weeks.  Bibil was stopped couple of weeks ago due to interaction with minoxidil.  Previously prior to admit in January he was maintained on  Minoxidil, lasix, atenolol and clonidine.  Cloniidine, lasix was stopped during admit in January.  Legs have swelling on/off , worse in evening.  rec Increase Minoxidil 10mg  Twice daily   Add Furosemide 40mg  daily  Continue on Atenolol 50mg  daily  Low salt diet     07/13/2012 f/u ov/Lauren Aguayo re hbp/ leg swelling, verified meds changed as above Chief Complaint  Patient presents with  . Follow-up    Pt still concerned about increased BP readings. He is also c/o the swelling in feet- esp left not improving.    no sob, cp,  ha tia or claudication >>labs done    07/27/2012 Follow up and Med review  Returns for follow up for HTN.  Difficult to control b/p over last several weeks.  Had several b/p changes w/ recent admission to NH.  4 weeks ago , Minoxidil increased Twice daily  And lasix increased 40mg  daily .  B/p remains elevated ~160 .  Labs showed improved scr at 1.3.  Leg swelling slightly improved  with lasix.  He says he feels good, no headache, visual/speech changes.  No chest pain or dyspnea.  Verified his MAR from NH and updated our list.  rec Add Aldactone 25mg  Twice daily  .  Continue on low salt diet  08/04/2012 f/u ov/Chelsei Mcchesney  Chief Complaint  Patient presents with  . Hypertension    Pt states bp is up and down states that leg swelling is going down.    does not have TEDS yet though ordered.  No sob either at hs or with activity, though very frail now and sedentary  >no changes   09/03/2012 Follow up  Pt returns for follow up . Says he is doing well.  He is participating in exercise program . Really likes [facility].  Tolerating b/p meds. B/p `150/80 today.  No chest pain,  orthopnea, increased LE edema.  Does have small heel ulcer, treated at AL. Says it is almost healed.  rec Low salt, no change rx  12/07/2012 f/u ov/Hebe Merriwether re:  Hbp/ cri now being followed monthly at snf Chief Complaint  Patient presents with  . [place]    Pt states overall doing well and he denies any new co's today.     No sob, tia, no increase leg swelling - very comfortable with the care he gets at snf   ROS  The following are not active complaints unless bolded sore throat, dysphagia, dental problems, itching, sneezing,  nasal congestion or excess/ purulent secretions, ear ache,   fever, chills, sweats, unintended wt loss, pleuritic or exertional cp, hemoptysis,  orthopnea pnd or leg swelling, presyncope, palpitations, heartburn, abdominal pain, anorexia, nausea, vomiting, diarrhea  or change in bowel or urinary habits, change in stools or urine, dysuria,hematuria,  rash, arthralgias, visual complaints, headache, numbness weakness or ataxia or problems with walking or coordination,  change in mood/affect or memory.         Past Medical History:  Diverticulosis..............................................................Marland KitchenPatterson  - Colonoscopy 02/28/06  - LGIB 03/15/2008 while on asa, dc'd  Gout  - Uric acid 10 01/2009 > allopurinol started March 21, 2009 > recheck 08/16/2010  7.8  Left leg swelling onset 05/2009  - Venous dopplers Jul 11, 2009 >>>neg  Hyperlipidemia  - Target LDL < 70 (hbp, PVD)   OLECRANON BURSITIIS........................................................Marland KitchenYates  Renal failure baseline creat 1.5  Hypertension  PROSTATE CA......................................................................Marland KitchenWrenn  - Cryotherapy 02/2004  ANEMIA  -08/22/09 hbg 11.0, iron studies> 11% sat  L leg weakness ......................................................................Marland Kitchen [facility] MAINTENANCE..........................................................Marland KitchenWert  - DT 03/04/2012  -  Pneumovax 10/2002 (age 79 last shot needed) - CPX 03/04/2012   Complex Med regimen  -Meds calendar adjust-February 15, 2008 , August 22, 2009 , 07/01/2011    Family History:  HBP brother  Pancreatic ca brother  CVA father and ? mother   Social History:  Never smoker  No ETOH  Retired, stays busy with church activities, no aerobics  Single             Objective:   Physical Exam W/c bound chronically ill elderly bm in no acute distress.  wt 206 March 29, 2008 > 201 March 21, 2009 > 203 Jul 11, 2009 > 204 October 16, 2009 > 205 February 08, 2010 > 02/15/2011 202  > 04/01/2011  207 >209 07/01/2011 > 03/04/2012  195>186 06/30/2012 > 07/13/2012  190>193 07/27/2012 >  192 08/04/2012 >196 09/03/2012  HEENT: nl dentition, turbinates, and orophanx. EAC clear with coarse ear hair- no wax seen TMs normal  Neck without JVD/Nodes/TM ,+right carotid  bruit , 2+pulses  Lungs clear to A and P bilaterally without cough on insp or exp maneuvers  RRR grade 1-2 SM ,  bilateral  1+L > R edema,  Abd soft and benign with nl excursion in the supine position. No bruits or organomegaly  Ext warm without calf tenderness, cyanosis clubbing TEDS hose, heel dressing intact-clean/dry MS Kyphotic spine, moderate, arthritic changes of the hands. w no obvious joint deformity, restriction,  Mod stooped over gait    CXR  07/13/2012 :  Enlargement the cardiopericardial silhouette is stable. No new or acute interval findings.        Assessment & Plan:

## 2012-12-08 NOTE — Assessment & Plan Note (Signed)
Adequate control on present rx, reviewed > no change in rx needed    Being follwed at snf - no further f/u here needed unless moves to care where he's taking his own meds

## 2012-12-08 NOTE — Assessment & Plan Note (Signed)
-   Baseline creat 1.6 01/2011  Lab Results  Component Value Date   CREATININE 1.9* 08/04/2012   CREATININE 1.4 07/13/2012   CREATININE 1.3 06/30/2012    Further f/u at snf

## 2012-12-15 ENCOUNTER — Telehealth: Payer: Self-pay | Admitting: Internal Medicine

## 2012-12-15 NOTE — Telephone Encounter (Signed)
Spoke with Nurse She states that she pt was unresponsive yesterday and his BP was low She states that when he finally came to, he was in a cold sweat- EMS wanted to take him to ED but he refused She states that today he seems fine and has no complaints His BP has been fluctuating though OV with MW for 10.23.14

## 2012-12-17 ENCOUNTER — Ambulatory Visit (INDEPENDENT_AMBULATORY_CARE_PROVIDER_SITE_OTHER): Payer: Medicare Other | Admitting: Internal Medicine

## 2012-12-17 ENCOUNTER — Other Ambulatory Visit (INDEPENDENT_AMBULATORY_CARE_PROVIDER_SITE_OTHER): Payer: Medicare Other

## 2012-12-17 ENCOUNTER — Encounter: Payer: Self-pay | Admitting: Internal Medicine

## 2012-12-17 VITALS — BP 150/100 | HR 87 | Temp 98.0°F | Ht 69.0 in | Wt 187.0 lb

## 2012-12-17 DIAGNOSIS — I10 Essential (primary) hypertension: Secondary | ICD-10-CM

## 2012-12-17 DIAGNOSIS — D638 Anemia in other chronic diseases classified elsewhere: Secondary | ICD-10-CM

## 2012-12-17 DIAGNOSIS — R4182 Altered mental status, unspecified: Secondary | ICD-10-CM

## 2012-12-17 DIAGNOSIS — R609 Edema, unspecified: Secondary | ICD-10-CM

## 2012-12-17 DIAGNOSIS — N189 Chronic kidney disease, unspecified: Secondary | ICD-10-CM

## 2012-12-17 LAB — CBC WITH DIFFERENTIAL/PLATELET
Basophils Relative: 0.7 % (ref 0.0–3.0)
Eosinophils Absolute: 0.1 10*3/uL (ref 0.0–0.7)
Eosinophils Relative: 1.6 % (ref 0.0–5.0)
Hemoglobin: 10 g/dL — ABNORMAL LOW (ref 13.0–17.0)
MCHC: 33.7 g/dL (ref 30.0–36.0)
MCV: 96.3 fl (ref 78.0–100.0)
Monocytes Absolute: 0.8 10*3/uL (ref 0.1–1.0)
Neutro Abs: 6.7 10*3/uL (ref 1.4–7.7)
Neutrophils Relative %: 76.4 % (ref 43.0–77.0)
RBC: 3.08 Mil/uL — ABNORMAL LOW (ref 4.22–5.81)
WBC: 8.8 10*3/uL (ref 4.5–10.5)

## 2012-12-17 LAB — BASIC METABOLIC PANEL
CO2: 26 mEq/L (ref 19–32)
Chloride: 109 mEq/L (ref 96–112)
Creatinine, Ser: 2.7 mg/dL — ABNORMAL HIGH (ref 0.4–1.5)
Potassium: 5.3 mEq/L — ABNORMAL HIGH (ref 3.5–5.1)
Sodium: 143 mEq/L (ref 135–145)

## 2012-12-17 NOTE — Progress Notes (Signed)
Subjective:    Patient ID: [name], male    DOB: 12-Mar-1926     MRN: [id number]  Brief patient profile:  23 yobm with difficult to control hypertension complicated by mild chronic renal insufficiency and tendency to chronic leg swelling L >> R - Developed Gout summer of 2009, saw gout doctor > Colchicine rx.   March 29, 2008 after hosp discharge for gib on asa, did not recur once asa stopped, GI svc saw pt and rec conservative rx for presumed diverticular bleed. No abd pain/wt loss.  rec maintain off asa     06/30/2012 Acute OV  Pt presents from NH due to persistently elevated b/p , avg 160-180 systolic.  Pt was admitted earlier this year 1/18 for a fall at home with secondary rhabdomyolysis (unable to get up from floor-down for prolonged time) . Labs showed acute on chronic renal failure .He was given IVF and discharged to [facility]. At that time b/p meds were changed to Norvasc and BiDil. He was readmitted with anemia-felt to be chronic, dehydration,  worsening renal failure and encephalopathy. Improved  with IVF.   He says he has been doing well in AL .  B/p have been slowly increasing over last several weeks.  Bibil was stopped couple of weeks ago due to interaction with minoxidil.  Previously prior to admit in January he was maintained on  Minoxidil, lasix, atenolol and clonidine.  Cloniidine, lasix was stopped during admit in January.  Legs have swelling on/off , worse in evening.  rec Increase Minoxidil 10mg  Twice daily   Add Furosemide 40mg  daily  Continue on Atenolol 50mg  daily  Low salt diet       07/27/2012 Follow up and Med review  Returns for follow up for HTN.  Difficult to control b/p over last several weeks.  Had several b/p changes w/ recent admission to NH.  4 weeks ago , Minoxidil increased Twice daily  And lasix increased 40mg  daily .  B/p remains elevated ~160 .  Labs showed improved scr at 1.3.  Leg swelling slightly improved with lasix.  He says he feels  good, no headache, visual/speech changes.  No chest pain or dyspnea.  Verified his MAR from NH and updated our list.  rec Add Aldactone 25mg  Twice daily  .  Continue on low salt diet  08/04/2012 f/u ov/Zain Bingman  Chief Complaint  Patient presents with  . Hypertension    Pt states bp is up and down states that leg swelling is going down.    does not have TEDS yet though ordered.  No sob either at hs or with activity, though very frail now and sedentary  >no changes   09/03/2012 Follow up  Pt returns for follow up . Says he is doing well.  He is participating in exercise program . Really likes [facility].  Tolerating b/p meds. B/p `150/80 today.  No chest pain, orthopnea, increased LE edema.  Does have small heel ulcer, treated at AL. Says it is almost healed.  rec Low salt, no change rx    12/17/2012 f/u ov/Lyndsi Altic re: hbp/ cri/ ? syncope Chief Complaint  Patient presents with  . Follow-up    Pt states he is doing well. BP has been fluctuating some. Nurse from facility had called to let us know that he was found unresponsive 3 days ago, and when he finally came to was sweating and clammy.       He says he maybe "fell asleep" x 3-5 min  denies focal complaints, ha, felt fine once woke up, no assoc cp, abd pain, or sob. No spells since  No obvious day to day or daytime variabilty or assoc chronic cough or cp or chest tightness, subjective wheeze overt sinus or hb symptoms. No unusual exp hx or h/o childhood pna/ asthma or knowledge of premature birth.  Sleeping ok without nocturnal  or early am exacerbation  of respiratory  c/o's or need for noct saba. Also denies any obvious fluctuation of symptoms with weather or environmental changes or other aggravating or alleviating factors except as outlined above   Current Medications, Allergies, Complete Past Medical History, Past Surgical History, Family History, and Social History were reviewed in [employer] record.  ROS   The following are not active complaints unless bolded sore throat, dysphagia, dental problems, itching, sneezing,  nasal congestion or excess/ purulent secretions, ear ache,   fever, chills, sweats, unintended wt loss, pleuritic or exertional cp, hemoptysis,  orthopnea pnd or leg swelling, presyncope, palpitations, heartburn, abdominal pain, anorexia, nausea, vomiting, diarrhea  or change in bowel or urinary habits, change in stools or urine, dysuria,hematuria,  rash, arthralgias, visual complaints, headache, numbness weakness or ataxia or problems with walking or coordination,  change in mood/affect or memory.            Past Medical History:  Diverticulosis..............................................................Marland KitchenPatterson  - Colonoscopy 02/28/06  - LGIB 03/15/2008 while on asa, dc'd  Gout  - Uric acid 10 01/2009 > allopurinol started March 21, 2009 > recheck 08/16/2010  7.8  Left leg swelling onset 05/2009  - Venous dopplers Jul 11, 2009 >>>neg  Hyperlipidemia  - Target LDL < 70 (hbp, PVD)   OLECRANON BURSITIIS........................................................Marland KitchenYates  Renal failure baseline creat 1.5  Hypertension  PROSTATE CA......................................................................Marland KitchenWrenn  - Cryotherapy 02/2004  ANEMIA  -08/22/09 hbg 11.0, iron studies> 11% sat  L leg weakness ......................................................................Marland Kitchen [facility] HEALTH MAINTENANCE..........................................................Marland KitchenWert  - DT 03/04/2012  - Pneumovax 10/2002 (age 89 last shot needed) - CPX 03/04/2012   Complex Med regimen  -Meds calendar adjust-February 15, 2008 , August 22, 2009 , 07/01/2011    Family History:  HBP brother  Pancreatic ca brother  CVA father and ? mother   Social History:  Never smoker  No ETOH  Retired, stays busy with church activities, no aerobics  Single             Objective:   Physical Exam W/c bound chronically ill  elderly bm in no acute distress.  wt 206 March 29, 2008 > 201 March 21, 2009 > 203 Jul 11, 2009 > 204 October 16, 2009 > 205 February 08, 2010 > 02/15/2011 202  > 04/01/2011  207 >209 07/01/2011 > 03/04/2012  195>186 06/30/2012 > 07/13/2012  190>193 07/27/2012 >  192 08/04/2012 >196 09/03/2012 > 187 12/17/2012  HEENT: nl dentition, turbinates, and orophanx. EAC clear with coarse ear hair- no wax seen TMs normal  Neck without JVD/Nodes/TM ,+right carotid bruit , 2+pulses  Lungs clear to A and P bilaterally without cough on insp or exp maneuvers  RRR grade 1-2 SM ,  bilateral  1+L > R edema,  Abd soft and benign with nl excursion in the supine position. No bruits or organomegaly  Ext warm without calf tenderness, cyanosis clubbing TEDS hose, heel dressing intact-clean/dry MS Kyphotic spine, moderate, arthritic changes of the hands. w no obvious joint deformity, restriction,  Mod stooped over gait    CXR  07/13/2012 : Enlargement the cardiopericardial silhouette is stable. No new or  acute interval findings.        Assessment & Plan:

## 2012-12-17 NOTE — Patient Instructions (Addendum)
Increase the atenolol to 50 mg twice daily  If event recurs check EKG  Please remember to go to the lab  department downstairs for your tests - we will call you with the results when they are available.  Late add Needs renal u/s, renal consult/d/c aldactone

## 2012-12-18 ENCOUNTER — Telehealth: Payer: Self-pay | Admitting: *Deleted

## 2012-12-18 DIAGNOSIS — R4182 Altered mental status, unspecified: Secondary | ICD-10-CM | POA: Insufficient documentation

## 2012-12-18 DIAGNOSIS — N189 Chronic kidney disease, unspecified: Secondary | ICD-10-CM

## 2012-12-18 MED ORDER — ATENOLOL 50 MG PO TABS: ORAL_TABLET | ORAL | Status: AC

## 2012-12-18 NOTE — Assessment & Plan Note (Signed)
- -   Venous dopplers   Jul 11, 2009 >>>neg   Persistent and assoc with cri > no further diuretics given worsening renal insufficiency and use elevation/teds instead

## 2012-12-18 NOTE — Assessment & Plan Note (Signed)
  Recent Labs Lab 12/17/12 1218  HGB 10.0*     Trending slt up at this point/ no evidence of blood loss explaining event

## 2012-12-18 NOTE — Assessment & Plan Note (Signed)
He says he just fell asleep but nursing notes unarousable and note not a diabetic and woke up sweaty suggesting possible vasovagal or cardiac issue.  He's not bradycardic today and if in fact a little tachycardic on atenolol so increase to bid dosing and rec check blood sugar and ekg if happens again.

## 2012-12-18 NOTE — Assessment & Plan Note (Signed)
Baseline creat 1.6 01/2011  Lab Results  Component Value Date   CREATININE 2.7* 12/17/2012   CREATININE 1.9* 08/04/2012   CREATININE 1.4 07/13/2012    K also starting to climb so will d/c aldactone, refer to renal, check u/s to r/o obst

## 2012-12-18 NOTE — Progress Notes (Signed)
Quick Note:  See 12/18/12 PN ______

## 2012-12-18 NOTE — Telephone Encounter (Signed)
Message copied by Christen Butter on Fri Dec 18, 2012  9:59 AM ------      Message from: Sandrea Hughs B      Created: Fri Dec 18, 2012  8:14 AM       Ask NH to D/C aldactone completely since renal fx worse ------

## 2012-12-18 NOTE — Assessment & Plan Note (Signed)
Complicated by cri/ vol overload and not adequately treated  rec increase tenormin to bid as not adequately blocked

## 2012-12-18 NOTE — Telephone Encounter (Signed)
Called [facility] and spoke with Tye and advised d/c aldactone and needs renal u/s and neph referral- (order sent to [facility])  Call patient : Studies show worsening renal function Needs renal u/s and referral to nephrology (I believe he has seen Lowell Guitar years ago)  Orders faxed to [facility] at [phone]

## 2012-12-24 ENCOUNTER — Ambulatory Visit (HOSPITAL_COMMUNITY)
Admission: RE | Admit: 2012-12-24 | Discharge: 2012-12-24 | Disposition: A | Discharge status: Home / Self Care | Payer: Medicare Other | Source: Ambulatory Visit | Attending: Internal Medicine | Admitting: Internal Medicine

## 2012-12-24 DIAGNOSIS — N281 Cyst of kidney, acquired: Secondary | ICD-10-CM | POA: Insufficient documentation

## 2012-12-24 DIAGNOSIS — I714 Abdominal aortic aneurysm, without rupture, unspecified: Secondary | ICD-10-CM | POA: Insufficient documentation

## 2012-12-24 DIAGNOSIS — N189 Chronic kidney disease, unspecified: Secondary | ICD-10-CM

## 2012-12-25 ENCOUNTER — Other Ambulatory Visit: Payer: Self-pay | Admitting: Internal Medicine

## 2012-12-25 DIAGNOSIS — I714 Abdominal aortic aneurysm, without rupture: Secondary | ICD-10-CM

## 2012-12-25 NOTE — Progress Notes (Signed)
Quick Note:  Order sent to [facility] for asap appt with Dr Early ______

## 2012-12-28 ENCOUNTER — Encounter: Payer: Self-pay | Admitting: Vascular Surgery

## 2012-12-29 ENCOUNTER — Encounter: Payer: Self-pay | Admitting: Vascular Surgery

## 2012-12-29 ENCOUNTER — Ambulatory Visit (INDEPENDENT_AMBULATORY_CARE_PROVIDER_SITE_OTHER): Payer: Medicare Other | Admitting: Vascular Surgery

## 2012-12-29 VITALS — BP 153/84 | HR 76 | Ht 69.0 in | Wt 209.0 lb

## 2012-12-29 DIAGNOSIS — I714 Abdominal aortic aneurysm, without rupture, unspecified: Secondary | ICD-10-CM

## 2013-01-05 ENCOUNTER — Encounter: Payer: Self-pay | Admitting: Vascular Surgery

## 2013-01-05 NOTE — Progress Notes (Signed)
Patient name: [name] MRN: [id number] DOB: 02/02/1927 Sex: male   Referred by: Wert  Reason for referral:  Chief Complaint  Patient presents with  . AAA    eval 7.6 AAA     HISTORY OF PRESENT ILLNESS: The patient presents for discussion of recently discovered 7.6 cm infrarenal abdominal aortic aneurysm. He was undergone renal scan for evaluation of chronic renal insufficiency. Had an incidental finding of a 7.6 cm aneurysm. He has no prior knowledge of this and no history of aneurysm in his family. Does have a remote history of right lower quadrant incision probably for right iliac artery aneurysm repair  Past Medical History  Diagnosis Date  . Diverticulosis     divertic bleed presmed in  2010  . Gout   . Leg swelling     left  . Hyperlipidemia   . History of myocardial infarction   . Olecranon bursitis   . Renal failure   . HTN (hypertension)   . Anemia   . Healthcare maintenance   . Prostate cancer     Past Surgical History  Procedure Laterality Date  . R ia aneurysmectomy      scar is in RLQ  . Prostate cryoablation  2006  . Suprapubic catheter placement  2006    History   Social History  . Marital Status: Single    Spouse Name: N/A    Number of Children: N/A  . Years of Education: N/A   Occupational History  . retired    Social History Main Topics  . Smoking status: Never Smoker   . Smokeless tobacco: Never Used  . Alcohol Use: No  . Drug Use: Not on file  . Sexual Activity: Not on file   Other Topics Concern  . Not on file   Social History Narrative   Stays busy with church activities, no aerobics    Family History  Problem Relation Age of Onset  . Hypertension Brother   . Pancreatic cancer Brother   . Stroke Father   . Stroke Mother     Allergies as of 12/29/2012  . (No Known Allergies)    Current Outpatient Prescriptions on File Prior to Visit  Medication Sig Dispense Refill  . acetaminophen (TYLENOL) 325 MG tablet  Take 650 mg by mouth 2 (two) times daily as needed. For pain      . allopurinol (ZYLOPRIM) 300 MG tablet Take 300 mg by mouth daily.      Marland Kitchen atenolol (TENORMIN) 50 MG tablet One twice daily      . docusate sodium 100 MG CAPS Take 100 mg by mouth 2 (two) times daily.      . feeding supplement (PRO-STAT SUGAR FREE 64) LIQD Take 30 mLs by mouth daily.      . furosemide (LASIX) 40 MG tablet Two daily  60 tablet  5  . isosorbide-hydrALAZINE (BIDIL) 20-37.5 MG per tablet Take 1 tablet by mouth 3 (three) times daily.  90 tablet  6  . minoxidil (LONITEN) 10 MG tablet One twice daily      . Multiple Vitamins-Minerals (CERTAVITE SENIOR/ANTIOXIDANT) TABS Take 1 tablet by mouth daily.      . polyethylene glycol (MIRALAX / GLYCOLAX) packet Take 17 g by mouth daily.       . Skin Protectants, Misc. (EUCERIN) cream Apply 1 application topically as needed for dry skin.      . collagenase (SANTYL) ointment Apply topically daily.  15 g  0  .  ENSURE (ENSURE) Take 237 mLs by mouth daily.       No current facility-administered medications on file prior to visit.     REVIEW OF SYSTEMS:  Positives indicated with an "X"  CARDIOVASCULAR:  [ ]  chest pain   [ ]  chest pressure   [ ]  palpitations   [ ]  orthopnea   [ ]  dyspnea on exertion   [ ]  claudication   [ ]  rest pain   [ ]  DVT   [ ]  phlebitis PULMONARY:   [ ]  productive cough   [ ]  asthma   [ ]  wheezing NEUROLOGIC:   [ ]  weakness  [ ]  paresthesias  [ ]  aphasia  [ ]  amaurosis  [ ]  dizziness HEMATOLOGIC:   [ ]  bleeding problems   [ ]  clotting disorders MUSCULOSKELETAL:  [ ]  joint pain   [ ]  joint swelling GASTROINTESTINAL: [ ]   blood in stool  [ ]   hematemesis GENITOURINARY:  [ ]   dysuria  [ ]   hematuria PSYCHIATRIC:  [ ]  history of major depression INTEGUMENTARY:  [ ]  rashes  [ ]  ulcers CONSTITUTIONAL:  [ ]  fever   [ ]  chills  PHYSICAL EXAMINATION:  General: The patient is a well-nourished male, in no acute distress. Vital signs are BP 153/84  Pulse 76   Ht 5\' 9"  (1.753 m)  Wt 209 lb (94.802 kg)  BMI 30.85 kg/m2  SpO2 95% Pulmonary: There is a good air exchange bilaterally without wheezing or rales. Abdomen: Soft and non-tender with normal pitch bowel sounds. Prominent aortic pulsation with no tenderness Musculoskeletal: There are no major deformities.  There is no significant extremity pain. Neurologic: No focal weakness or paresthesias are detected, Skin: There are no ulcer or rashes noted. Psychiatric: The patient has normal affect. Cardiovascular: There is a regular rate and rhythm without significant murmur appreciated.     Impression and Plan:  Very large abdominal aortic aneurysm. I discussed the significance of this with the patient. I have recommended CT scan for further evaluation. I did explain the magnitude of open repair do not know whether he would be a candidate for this. He may be a candidate for stent grafting. We will obtain a CT scan discuss this further with him on our next visit. I did discuss symptoms of leaking aneurysm he knows to notify sister immediate should this occur    [name] [facility] Office: [phone]

## 2013-01-18 ENCOUNTER — Ambulatory Visit
Admission: RE | Admit: 2013-01-18 | Discharge: 2013-01-18 | Disposition: A | Discharge status: Home / Self Care | Payer: Medicare Other | Source: Ambulatory Visit | Attending: Vascular Surgery | Admitting: Vascular Surgery

## 2013-01-18 ENCOUNTER — Encounter: Payer: Self-pay | Admitting: Vascular Surgery

## 2013-01-18 DIAGNOSIS — I714 Abdominal aortic aneurysm, without rupture: Secondary | ICD-10-CM

## 2013-01-19 ENCOUNTER — Encounter: Payer: Self-pay | Admitting: Vascular Surgery

## 2013-01-19 ENCOUNTER — Ambulatory Visit (INDEPENDENT_AMBULATORY_CARE_PROVIDER_SITE_OTHER): Payer: Medicare Other | Admitting: Vascular Surgery

## 2013-01-19 VITALS — BP 158/80 | HR 86 | Resp 18 | Ht 70.0 in | Wt 220.6 lb

## 2013-01-19 DIAGNOSIS — I714 Abdominal aortic aneurysm, without rupture: Secondary | ICD-10-CM

## 2013-01-19 NOTE — Progress Notes (Signed)
Today for continued discussion of his large abdominal aortic aneurysm and iliac artery aneurysms. He undergone a renal ultrasound and had incidental finding of a 7/2 cm abdominal aortic aneurysm. I've seen several weeks ago and recommended a CT  for further evaluation. He does have renal insufficiency and therefore this was a noncontrast study. He is here for discussion today. He has no new symptoms and certainly has no symptoms referable to his aneurysm  Past Medical History  Diagnosis Date  . Diverticulosis     divertic bleed presmed in  2010  . Gout   . Leg swelling     left  . Hyperlipidemia   . History of myocardial infarction   . Olecranon bursitis   . Renal failure   . HTN (hypertension)   . Anemia   . Healthcare maintenance   . Prostate cancer   . AAA (abdominal aortic aneurysm)     History  Substance Use Topics  . Smoking status: Never Smoker   . Smokeless tobacco: Never Used  . Alcohol Use: No    Family History  Problem Relation Age of Onset  . Hypertension Brother   . Pancreatic cancer Brother   . Stroke Father   . Stroke Mother     No Known Allergies  Current outpatient prescriptions:acetaminophen (TYLENOL) 325 MG tablet, Take 650 mg by mouth 2 (two) times daily as needed. For pain, Disp: , Rfl: ;  allopurinol (ZYLOPRIM) 300 MG tablet, Take 300 mg by mouth daily., Disp: , Rfl: ;  atenolol (TENORMIN) 50 MG tablet, One twice daily, Disp: , Rfl: ;  collagenase (SANTYL) ointment, Apply topically daily., Disp: 15 g, Rfl: 0 docusate sodium 100 MG CAPS, Take 100 mg by mouth 2 (two) times daily., Disp: , Rfl: ;  ENSURE (ENSURE), Take 237 mLs by mouth daily., Disp: , Rfl: ;  feeding supplement (PRO-STAT SUGAR FREE 64) LIQD, Take 30 mLs by mouth daily., Disp: , Rfl: ;  furosemide (LASIX) 40 MG tablet, Two daily, Disp: 60 tablet, Rfl: 5;  isosorbide-hydrALAZINE (BIDIL) 20-37.5 MG per tablet, Take 1 tablet by mouth 3 (three) times daily., Disp: 90 tablet, Rfl: 6 minoxidil  (LONITEN) 10 MG tablet, One twice daily, Disp: , Rfl: ;  Multiple Vitamins-Minerals (CERTAVITE SENIOR/ANTIOXIDANT) TABS, Take 1 tablet by mouth daily., Disp: , Rfl: ;  polyethylene glycol (MIRALAX / GLYCOLAX) packet, Take 17 g by mouth daily. , Disp: , Rfl: ;  Skin Protectants, Misc. (EUCERIN) cream, Apply 1 application topically as needed for dry skin., Disp: , Rfl:   BP 158/80  Pulse 86  Resp 18  Ht 5\' 10"  (1.778 m)  Wt 220 lb 9.6 oz (100.064 kg)  BMI 31.65 kg/m2  Body mass index is 31.65 kg/(m^2).       Physical exam well-developed well-nourished gentleman in no acute distress Does have some difficulty with mobility but walks with a walker Abdomen with moderate obesity no tenderness Palpable femoral pulses bilaterally  CT scan shows supra-renal component of his aneurysm at about 3.5 cm. 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 aneurysms. I explained his only option for repair would be open repair. I feel that he has extreme high risk and is not a candidate for open repair due to his multiple medical comorbidities. I did explain the potential for a rupture of his aneurysm. I explained  this would be a certainly life-threatening and would be very nearly impossible for elective for urgent repair. I would not recommend any further followup. The patient reports that he is ready to go on the time comments and agrees with a non-operative treatment. Will see [place] on an as-needed basisey. I explained that he is not a candidate for any type of stent stent graft repair do to his suprarenal component and his diffuse iliac

## 2013-03-22 ENCOUNTER — Telehealth: Payer: Self-pay | Admitting: Internal Medicine

## 2013-03-22 NOTE — Telephone Encounter (Signed)
I spoke with Suanne Marker and advised that I am not sure when /if the pt saw Dr. Florene Glen. She states she has looked through her charts and has requested paper chart because she does not see where he has seen anyone either. Nothing further needed. Pt has an appt on 03-24-13. [place] Bing, CMA

## 2013-05-31 ENCOUNTER — Telehealth: Payer: Self-pay | Admitting: Internal Medicine

## 2013-05-31 NOTE — Telephone Encounter (Signed)
Notes Recorded by Tanda Rockers, MD on 12/17/2012 at 3:23 PM Call patient : Studies show worsening renal function Needs renal u/s and referral to nephrology (I believe he has seen Florene Glen years ago) ---  [facility] PT and line rang several times to speak with him. No VM and no answer WCB

## 2013-06-01 ENCOUNTER — Encounter: Payer: Self-pay | Admitting: Internal Medicine

## 2013-06-01 NOTE — Telephone Encounter (Signed)
I spoke with the pt and he is confused about his nephrology appt. He states he thought Dr. Donnetta Hutching told him he did not need to go to this appt and this is why it was cancelled. Dr. Melvyn Novas does this pt need to still see nephrology? Please advise.[name], CMA

## 2013-06-01 NOTE — Telephone Encounter (Signed)
Spoke with pt. He is aware to bring all medications with him to his scheduled OV w/ MW. Nothing further needed

## 2013-06-01 NOTE — Telephone Encounter (Signed)
We don't have any record of his kidney function since 11/2012 so first step is return here to regroup with all his meds in hand if out of NH and updated  MAR if not

## 2013-06-10 ENCOUNTER — Ambulatory Visit (INDEPENDENT_AMBULATORY_CARE_PROVIDER_SITE_OTHER): Payer: Medicare Other | Admitting: Internal Medicine

## 2013-06-10 ENCOUNTER — Encounter: Payer: Self-pay | Admitting: Internal Medicine

## 2013-06-10 VITALS — BP 134/82 | HR 66 | Temp 98.3°F | Wt 231.0 lb

## 2013-06-10 DIAGNOSIS — I714 Abdominal aortic aneurysm, without rupture, unspecified: Secondary | ICD-10-CM

## 2013-06-10 DIAGNOSIS — I10 Essential (primary) hypertension: Secondary | ICD-10-CM

## 2013-06-10 DIAGNOSIS — N189 Chronic kidney disease, unspecified: Secondary | ICD-10-CM

## 2013-06-10 NOTE — Progress Notes (Signed)
Subjective:    Patient ID: [name], male    DOB: September 12, 1926     MRN: [id number]  Brief patient profile:  30 yobm with difficult to control hypertension complicated by mild chronic renal insufficiency and tendency to chronic leg swelling L >> R - Developed Gout summer of 2009, saw gout doctor > [facility] rx.   March 29, 2008 after hosp discharge for gib on asa, did not recur once asa stopped, GI svc saw pt and rec conservative rx for presumed diverticular bleed. No abd pain/wt loss.  rec maintain off asa     06/30/2012 Acute OV  Pt presents from NH due to persistently elevated b/p , avg 132-440 systolic.  Pt was admitted earlier this year 1/18 for a fall at home with secondary rhabdomyolysis (unable to get up from floor-down for prolonged time) . Labs showed acute on chronic renal failure .He was given IVF and discharged to [facility]. At that time b/p meds were changed to Norvasc and BiDil. He was readmitted with anemia-felt to be chronic, dehydration,  worsening renal failure and encephalopathy. Improved  with IVF.   He says he has been doing well in AL .  B/p have been slowly increasing over last several weeks.  Bibil was stopped couple of weeks ago due to interaction with minoxidil.  Previously prior to admit in January he was maintained on  Minoxidil, lasix, atenolol and clonidine.  Cloniidine, lasix was stopped during admit in January.  Legs have swelling on/off , worse in evening.  rec Increase Minoxidil 10mg  Twice daily   Add Furosemide 40mg  daily  Continue on Atenolol 50mg  daily  Low salt diet       07/27/2012 Follow up and Med review  Returns for follow up for HTN.  Difficult to control b/p over last several weeks.  Had several b/p changes w/ recent admission to NH.  4 weeks ago , Minoxidil increased Twice daily  And lasix increased 40mg  daily .  B/p remains elevated ~160 .  Labs showed improved scr at 1.3.  Leg swelling slightly improved with lasix.  He says he feels  good, no headache, visual/speech changes.  No chest pain or dyspnea.  Verified his MAR from NH and updated our list.  rec Add Aldactone 25mg  Twice daily  .  Continue on low salt diet  08/04/2012 f/u ov/Wert  Chief Complaint  Patient presents with  . Hypertension    Pt states bp is up and down states that leg swelling is going down.    does not have TEDS yet though ordered.  No sob either at hs or with activity, though very frail now and sedentary  >no changes   09/03/2012 Follow up  Pt returns for follow up . Says he is doing well.  He is participating in exercise program . Really likes [facility].  Tolerating b/p meds. B/p `150/80 today.  No chest pain, orthopnea, increased LE edema.  Does have small heel ulcer, treated at AL. Says it is almost healed.  rec Low salt, no change rx    12/17/2012 f/u ov/Wert re: hbp/ cri/ ? syncope Chief Complaint  Patient presents with  . Follow-up    Pt states he is doing well. BP has been fluctuating some. Nurse from facility had called to let us know that he was found unresponsive 3 days ago, and when he finally came to was sweating and clammy.       He says he maybe "fell asleep" x 3-5 min  denies focal complaints, ha, felt fine once woke up, no assoc cp, abd pain, or sob. No spells since rec Increase the atenolol to 50 mg twice daily        06/10/2013 f/u ov/Wert re: hbp/ cri/ AAA  In Assisted living f/u by Dr Art Nyoka Cowden  Chief Complaint  Patient presents with  . Follow-up    Pt reports doing well and denies any co's today.      Has seen renal and Vasc surgery and turned down for AAA surgery and wants to puts his hands completely in the lord, no further studies/ specialist but if anything doing better in every regard but very very sedentary now.  Deniess sob/ chronic cough or cp or chest tightness, subjective wheeze overt sinus or hb symptoms. No unusual exp hx or h/o childhood pna/ asthma or knowledge of premature birth.  Sleeping  ok without nocturnal  or early am exacerbation  of respiratory  c/o's or need for noct saba. Also denies any obvious fluctuation of symptoms with weather or environmental changes or other aggravating or alleviating factors except as outlined above   Current Medications, Allergies, Complete Past Medical History, Past Surgical History, Family History, and Social History were reviewed in [employer] record.  ROS  The following are not active complaints unless bolded sore throat, dysphagia, dental problems, itching, sneezing,  nasal congestion or excess/ purulent secretions, ear ache,   fever, chills, sweats, unintended wt loss, pleuritic or exertional cp, hemoptysis,  orthopnea pnd or leg swelling, presyncope, palpitations, heartburn, abdominal pain, anorexia, nausea, vomiting, diarrhea  or change in bowel or urinary habits, change in stools or urine, dysuria,hematuria,  rash, arthralgias, visual complaints, headache, numbness weakness or ataxia or problems with walking or coordination,  change in mood/affect or memory.            Past Medical History:  Diverticulosis..............................................................Marland KitchenPatterson  - Colonoscopy 02/28/06  - LGIB 03/15/2008 while on asa, dc'd  Gout  - Uric acid 10 01/2009 > allopurinol started March 21, 2009 > recheck 08/16/2010  7.8  Left leg swelling onset 05/2009  - Venous dopplers Jul 11, 2009 >>>neg  Hyperlipidemia  - Target LDL < 70 (hbp, PVD)   OLECRANON BURSITIIS........................................................Marland KitchenYates  Renal failure baseline creat 1.5  Hypertension  PROSTATE CA......................................................................Marland KitchenWrenn  - Cryotherapy 02/2004  ANEMIA  -08/22/09 hbg 11.0, iron studies> 11% sat  L leg weakness ......................................................................Marland Kitchen [place]..........................................................Marland KitchenWert  -  DT 03/04/2012  - Pneumovax 10/2002 (age 89 last shot needed) - CPX 03/04/2012   Complex Med regimen  -Meds calendar adjust-February 15, 2008 , August 22, 2009 , 07/01/2011    Family History:  HBP brother  Pancreatic ca brother  CVA father and ? mother   Social History:  Never smoker  No ETOH  Retired, stays busy with church activities, no aerobics  Single             Objective:   Physical Exam W/c bound chronically ill elderly bm in no acute distress.  wt 206 March 29, 2008 > 201 March 21, 2009 > 203 Jul 11, 2009 > 204 October 16, 2009 > 205 February 08, 2010 > 02/15/2011 202  > 04/01/2011  207 >209 07/01/2011 > 03/04/2012  195>186 06/30/2012 > 07/13/2012  190>193 07/27/2012 >  192 08/04/2012 >196 09/03/2012 > 187 12/17/2012 > 06/10/2013  231  HEENT: nl dentition, turbinates, and orophanx. EAC clear with coarse ear hair- no wax seen TMs normal  Neck without JVD/Nodes/TM ,+right carotid bruit ,  2+pulses  Lungs clear to A and P bilaterally without cough on insp or exp maneuvers  RRR grade 1-2 SM ,  bilateral  1+L > R edema,  Abd soft and benign with nl excursion in the supine position. No bruits or organomegaly  Ext warm without calf tenderness, cyanosis clubbing TEDS hose, heel dressing intact-clean/dry MS Kyphotic spine, moderate, arthritic changes of the hands. w no obvious joint deformity, restriction,  Mod stooped over gait    CXR  07/13/2012 : Enlargement the cardiopericardial silhouette is stable. No new or acute interval findings.        Assessment & Plan:

## 2013-06-10 NOTE — Patient Instructions (Addendum)
I will let Dr Art Nyoka Cowden know your wishes regarding letting nature take its course and no further specialty care needed but NCB status needs to be confirmed.

## 2013-06-13 NOTE — Assessment & Plan Note (Signed)
Marginally Adequate control on present rx, reviewed > no changes feasible as adequately BB

## 2013-06-13 NOTE — Assessment & Plan Note (Signed)
Lab Results  Component Value Date   CREATININE 2.7* 12/17/2012   CREATININE 1.9* 08/04/2012   CREATININE 1.4 07/13/2012     Declines HD/ further renal f/u

## 2013-06-13 NOTE — Assessment & Plan Note (Signed)
Turned down for surgery, no interested in futher f/u  Desires NCB status

## 2013-10-13 IMAGING — CR DG CHEST 2V
3 series · 3 of 3 positions shown · non-contrast
Comparison: 02/15/2011

CLINICAL DATA: History of hypertension.

CHEST - 2 VIEW

[view not recorded (1 of 3)]
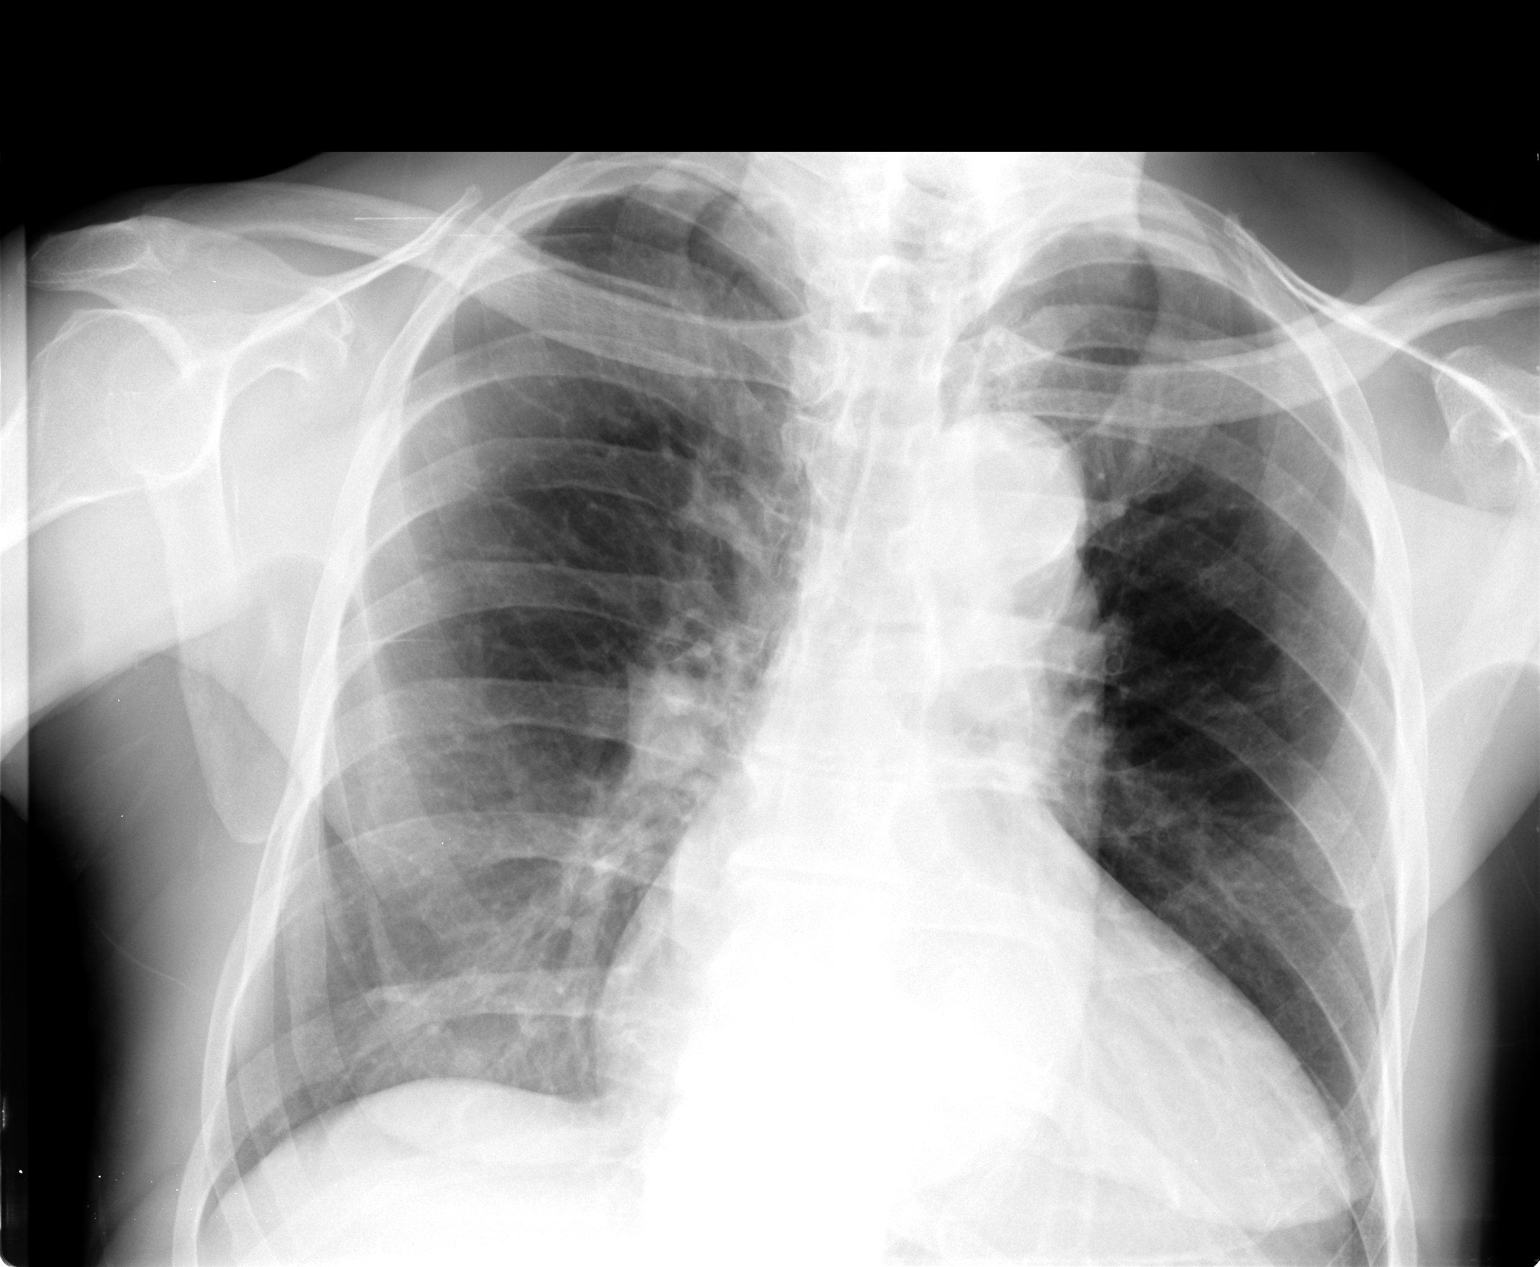

[view not recorded (2 of 3)]
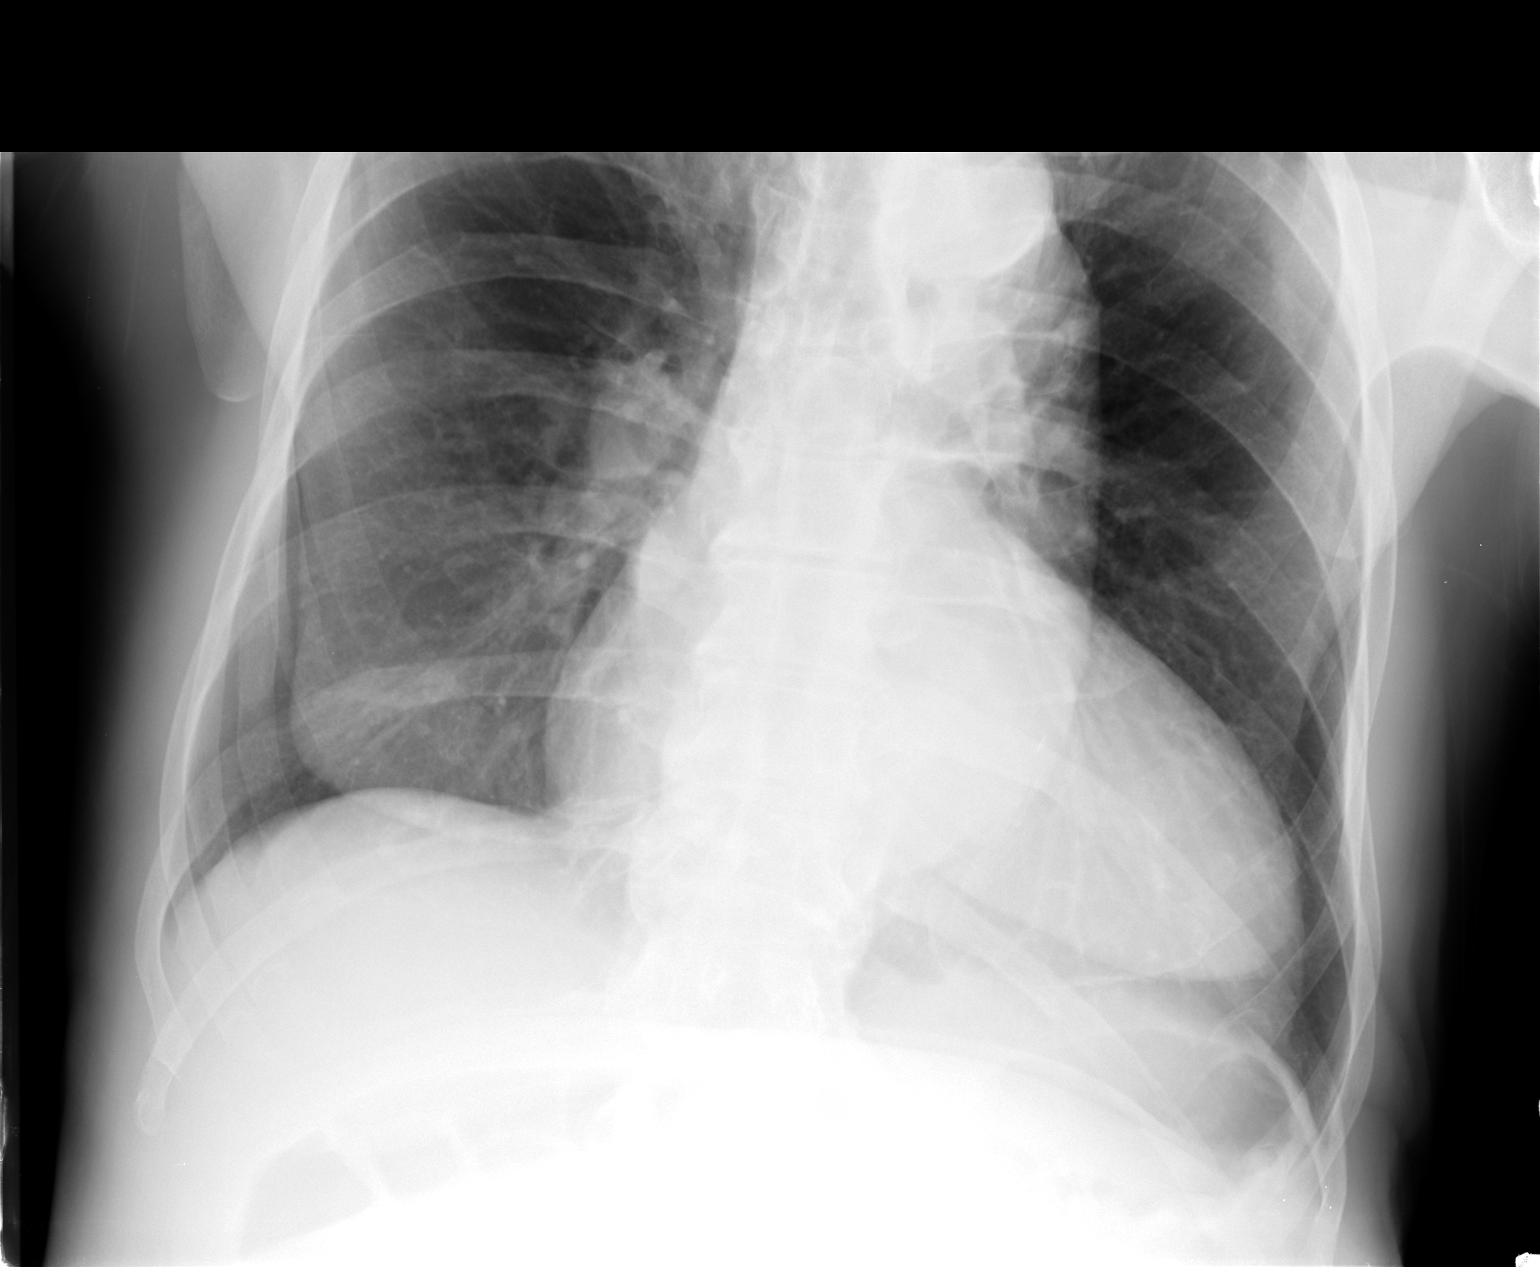

[view not recorded (3 of 3)]
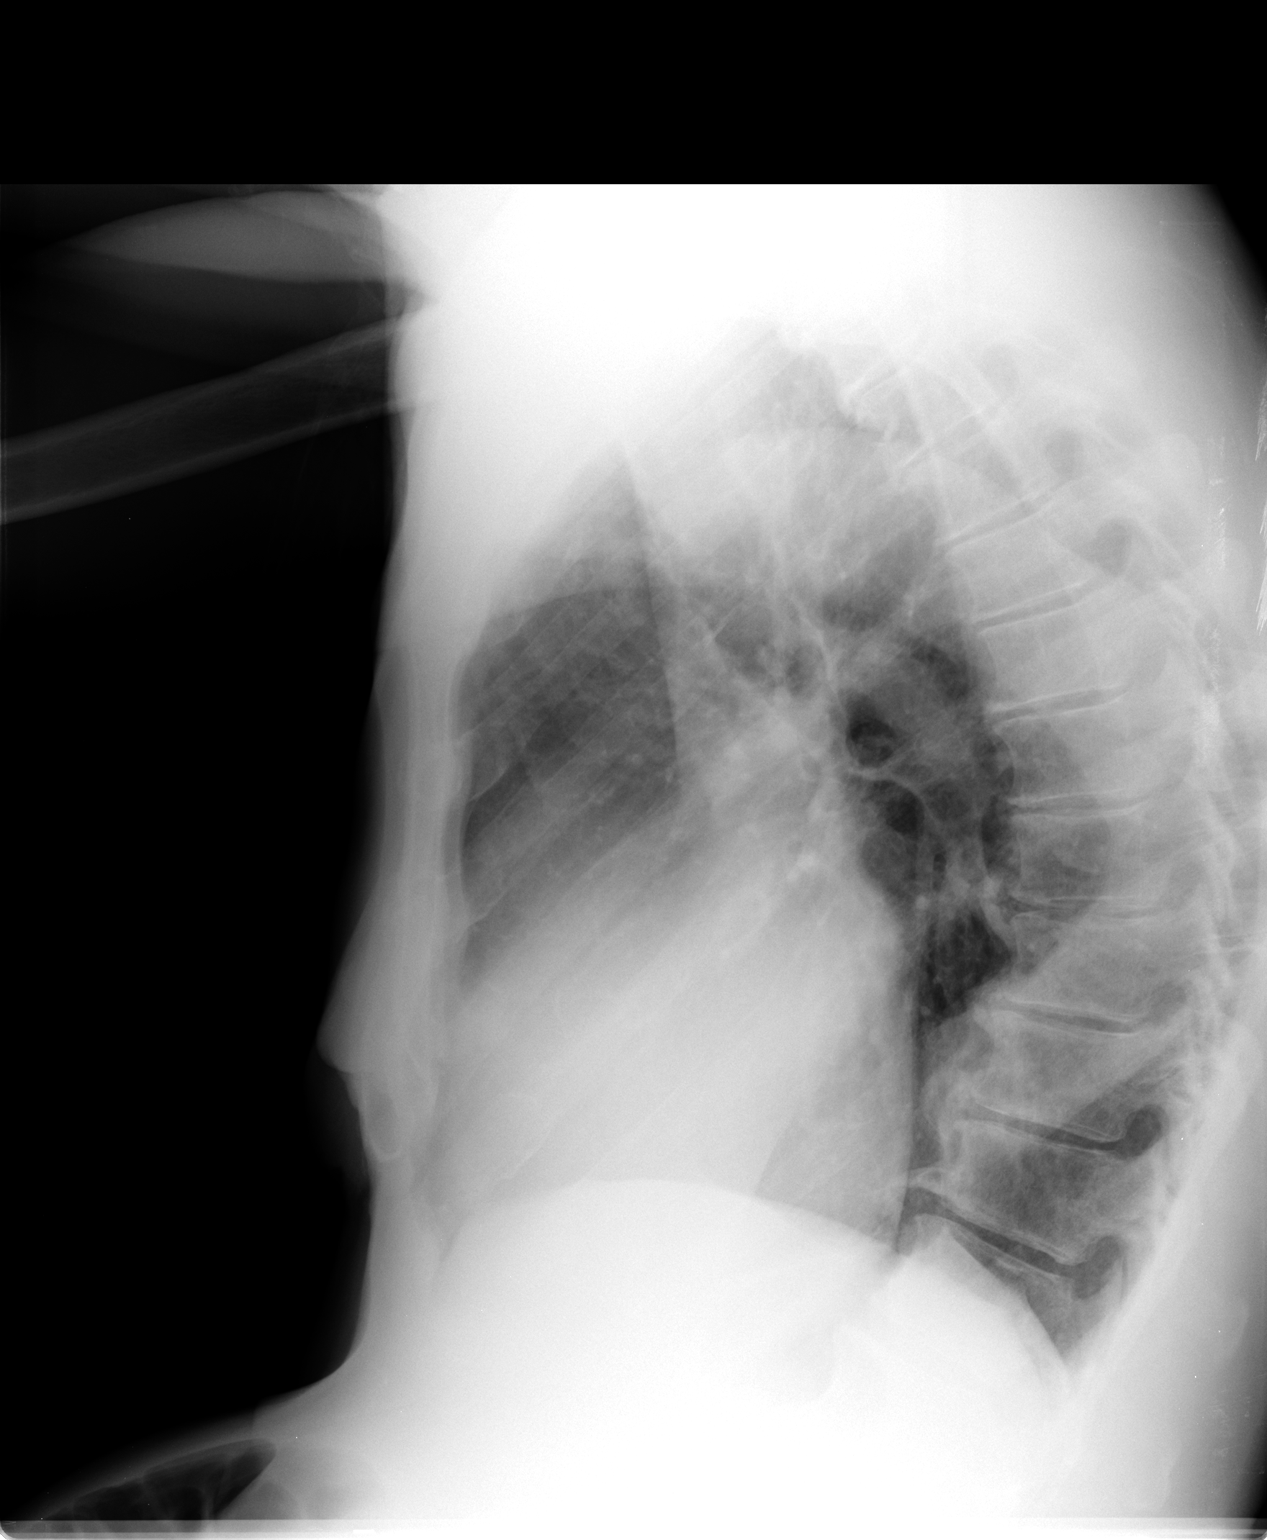

[3 of 3 positions shown; findings below may reference images not displayed]

FINDINGS: Stable cardiomegaly.  There is also stable tortuosity of
the thoracic aorta.  The lungs are clear and show no evidence of
infiltrates, nodules or pulmonary edema.  No pleural fluid is
identified.  Stable mild degenerative changes are present in the
lower thoracic spine.
IMPRESSION: Stable cardiomegaly.  No active disease.

## 2013-10-25 ENCOUNTER — Ambulatory Visit (INDEPENDENT_AMBULATORY_CARE_PROVIDER_SITE_OTHER): Payer: Medicare Other | Admitting: Internal Medicine

## 2013-10-25 ENCOUNTER — Encounter: Payer: Self-pay | Admitting: Internal Medicine

## 2013-10-25 VITALS — BP 144/90 | HR 73 | Temp 99.4°F | Ht 69.0 in | Wt 225.0 lb

## 2013-10-25 DIAGNOSIS — N183 Chronic kidney disease, stage 3 unspecified: Secondary | ICD-10-CM

## 2013-10-25 DIAGNOSIS — I10 Essential (primary) hypertension: Secondary | ICD-10-CM

## 2013-10-25 NOTE — Assessment & Plan Note (Signed)
-   Baseline creat 1.6 01/2011 - Renal u/s 0/30/14 Probable medical renal disease changes of both kidneys. With  7 cm AAA   Lab Results  Component Value Date   CREATININE 2.7* 12/17/2012   CREATININE 1.9* 08/04/2012   CREATININE 1.4 07/13/2012     Renal f/u can be prn in this setting (not candidate for HD or AAA repair)

## 2013-10-25 NOTE — Assessment & Plan Note (Addendum)
Marginally Adequate control on present rx, reviewed concern he has inop AAA > no change in rx needed  As risk worse renal insuff with diuresis at this point/ pt is full NCB status   I had an extended summary discussion with the patient today lasting 15 to 20 minutes of a 25 minute visit on the following issues:  Nothing further to be gained with pulmonary input into his care > at this point would let Dr Rolly Salter team handle all referrals and we will see him back here prn

## 2013-10-25 NOTE — Patient Instructions (Signed)
Please have the staff ask Dr Art Rolly Salter service to take over on all aspects of your care and decide when/ if you need to return to any specialists for further care.  Pulmonary follow up is as needed

## 2013-10-25 NOTE — Progress Notes (Signed)
Subjective:    Patient ID: [name], male    DOB: 10-26-1926     MRN: [id number]  Brief patient profile:  80 yobm with difficult to control hypertension complicated by mild chronic renal insufficiency and tendency to chronic leg swelling L >> R  Now at [facility] listed under Dr [name] care   History of Present Illness  March 29, 2008 after hosp discharge for gib on asa, did not recur once asa stopped, GI svc saw pt and rec conservative rx for presumed diverticular bleed. No abd pain/wt loss.  rec maintain off asa     06/30/2012 Acute OV  Pt presents from NH due to persistently elevated b/p , avg 116-579 systolic.  Pt was admitted earlier this year 1/18 for a fall at home with secondary rhabdomyolysis (unable to get up from floor-down for prolonged time) . Labs showed acute on chronic renal failure .He was given IVF and discharged to [facility]. At that time b/p meds were changed to Norvasc and BiDil. He was readmitted with anemia-felt to be chronic, dehydration,  worsening renal failure and encephalopathy. Improved  with IVF.   He says he has been doing well in AL .  B/p have been slowly increasing over last several weeks.  Bibil was stopped couple of weeks ago due to interaction with minoxidil.  Previously prior to admit in January he was maintained on  Minoxidil, lasix, atenolol and clonidine.  Cloniidine, lasix was stopped during admit in January.  Legs have swelling on/off , worse in evening.  rec Increase Minoxidil 10mg  Twice daily   Add Furosemide 40mg  daily  Continue on Atenolol 50mg  daily  Low salt diet       07/27/2012 Follow up and Med review  Returns for follow up for HTN.  Difficult to control b/p over last several weeks.  Had several b/p changes w/ recent admission to NH.  4 weeks ago , Minoxidil increased Twice daily  And lasix increased 40mg  daily .  B/p remains elevated ~160 .  Labs showed improved scr at 1.3.  Leg swelling slightly improved with lasix.   He says he feels good, no headache, visual/speech changes.  No chest pain or dyspnea.  Verified his MAR from NH and updated our list.  rec Add Aldactone 25mg  Twice daily  .  Continue on low salt diet  08/04/2012 f/u ov/[name]  Chief Complaint  Patient presents with  . Hypertension    Pt states bp is up and down states that leg swelling is going down.    does not have TEDS yet though ordered.  No sob either at hs or with activity, though very frail now and sedentary  >no changes   09/03/2012 Follow up  Pt returns for follow up . Says he is doing well.  He is participating in exercise program . Really likes [facility].  Tolerating b/p meds. B/p `150/80 today.  No chest pain, orthopnea, increased LE edema.  Does have small heel ulcer, treated at AL. Says it is almost healed.  rec Low salt, no change rx    12/17/2012 f/u ov/[name] re: hbp/ cri/ ? syncope Chief Complaint  Patient presents with  . Follow-up    Pt states he is doing well. BP has been fluctuating some. Nurse from facility had called to let us know that he was found unresponsive 3 days ago, and when he finally came to was sweating and clammy.       He says he maybe "  fell asleep" x 3-5 min denies focal complaints, ha, felt fine once woke up, no assoc cp, abd pain, or sob. No spells since rec Increase the atenolol to 50 mg twice daily        06/10/2013 f/u ov/[name] re: hbp/ cri/ AAA  In Assisted living f/u by Dr [name]  Chief Complaint  Patient presents with  . Follow-up    Pt reports doing well and denies any co's today.   Has seen renal and Vasc surgery and turned down for AAA surgery and wants to puts his hands completely in the lord, no further studies/ specialist but if anything doing better in every regard but very very sedentary now. rec I will let Dr [name] know your wishes regarding letting nature take its course and no further specialty care needed but NCB status needs to be  confirmed.     10/25/2013 f/u ov/[name] re: hbp/ AAA/ CRI Chief Complaint  Patient presents with  . Follow-up    Pt states doing well and denies any co's today.   no change chronic leg swelling, having a harder time getting to ov's and using rolling walker now. No abd pain, claudication, orthopnea or cp.  Deniess sob/ chronic cough or chest tightness, subjective wheeze overt sinus or hb symptoms. No unusual exp hx or h/o childhood pna/ asthma or knowledge of premature birth.  Sleeping ok without nocturnal  or early am exacerbation  of respiratory  c/o's or need for noct saba. Also denies any obvious fluctuation of symptoms with weather or environmental changes or other aggravating or alleviating factors except as outlined above   Current Medications, Allergies, Complete Past Medical History, Past Surgical History, Family History, and Social History were reviewed in [employer] record.  ROS  The following are not active complaints unless bolded sore throat, dysphagia, dental problems, itching, sneezing,  nasal congestion or excess/ purulent secretions, ear ache,   fever, chills, sweats, unintended wt loss, pleuritic or exertional cp, hemoptysis,  orthopnea pnd or leg swelling, presyncope, palpitations, heartburn, abdominal pain, anorexia, nausea, vomiting, diarrhea  or change in bowel or urinary habits, change in stools or urine, dysuria,hematuria,  rash, arthralgias, visual complaints, headache, numbness weakness or ataxia or problems with walking or coordination,  change in mood/affect or memory.            Past Medical History:  Diverticulosis..............................................................Marland KitchenPatterson  - Colonoscopy 02/28/06  - LGIB 03/15/2008 while on asa, dc'd  Gout  - Uric acid 10 01/2009 > allopurinol started March 21, 2009 > recheck 08/16/2010  7.8  Left leg swelling onset 05/2009  - Venous dopplers Jul 11, 2009 >>>neg  Hyperlipidemia  - Target  LDL < 70 (hbp, PVD)   OLECRANON BURSITIIS........................................................Marland KitchenYates  Renal failure baseline creat 1.5  Hypertension  PROSTATE CA......................................................................Marland KitchenWrenn  - Cryotherapy 02/2004  ANEMIA  -08/22/09 hbg 11.0, iron studies> 11% sat  L leg weakness ......................................................................Marland Kitchen [place]..........................................................Marland KitchenWert  - DT 03/04/2012  - Pneumovax 10/2002 (age 60 last shot needed) - CPX 03/04/2012   Complex Med regimen  -Meds calendar adjust-February 15, 2008 , August 22, 2009 , 07/01/2011    Family History:  HBP brother  Pancreatic ca brother  CVA father and ? mother   Social History:  Never smoker  No ETOH  Retired, stays busy with church activities, no aerobics  Single             Objective:   Physical Exam Stoic  chronically ill elderly bm in no  acute distress.  wt 206 March 29, 2008 > 201 March 21, 2009 > 203 Jul 11, 2009 > 204 October 16, 2009 > 205 February 08, 2010 > 02/15/2011 202  > 04/01/2011  207 >209 07/01/2011 > 03/04/2012  195>186 06/30/2012 > 07/13/2012  190>193 07/27/2012 >  192 08/04/2012 >196 09/03/2012 > 187 12/17/2012 > 06/10/2013  231 > 10/25/2013  225  HEENT: nl dentition, turbinates, and orophanx. EAC clear with coarse ear hair- no wax seen TMs normal  Neck without JVD/Nodes/TM ,+right carotid bruit , 2+pulses  Lungs clear to A and P bilaterally without cough on insp or exp maneuvers  RRR grade 1-2 SM ,  bilateral  1+L > R edema,  Abd soft and benign with nl excursion in the supine position. No bruits or organomegaly  Ext warm without calf tenderness, cyanosis clubbing TEDS hose, heel dressing intact-clean/dry MS Kyphotic spine, moderate, arthritic changes of the hands. w no obvious joint deformity, restriction,  Mod stooped over gait    CXR  07/13/2012 : Enlargement the cardiopericardial silhouette  is stable. No new or acute interval findings.        Assessment & Plan:

## 2013-11-03 ENCOUNTER — Other Ambulatory Visit: Payer: Self-pay | Admitting: Internal Medicine

## 2013-11-03 DIAGNOSIS — N189 Chronic kidney disease, unspecified: Secondary | ICD-10-CM

## 2014-01-12 ENCOUNTER — Encounter: Payer: Self-pay | Admitting: Neurology

## 2014-01-18 ENCOUNTER — Encounter: Payer: Self-pay | Admitting: Neurology

## 2014-02-14 ENCOUNTER — Telehealth: Payer: Self-pay | Admitting: Internal Medicine

## 2014-02-14 NOTE — Telephone Encounter (Signed)
Called and spoke to [name], with [employer]. [name] is requesting the reason the pt is needing home health, last OV note and the encounter date on form to be change to the last time MW saw pt. Received form from fax, changed encounter date to the date MW saw pt and printed OV note. Dr. Melvyn Novas will need to complete the "medical condition" section.   Placed in MW's look at's and forward to [place] to follow.

## 2014-02-14 NOTE — Telephone Encounter (Signed)
Form in MW's lookat  Please fill out the highlighted portion  Thanks!

## 2014-02-15 NOTE — Telephone Encounter (Signed)
done

## 2014-02-15 NOTE — Telephone Encounter (Signed)
Magda Paganini please advise if you have this completed form. Thanks.

## 2014-02-15 NOTE — Telephone Encounter (Signed)
I have faxed the form  Nothing further needed

## 2014-02-21 IMAGING — CR DG CHEST 2V
2 series · 2 of 2 positions shown · non-contrast
Comparison: 03/24/2012

CLINICAL DATA: Physical exam

CHEST - 2 VIEW

[view not recorded (1 of 2)]
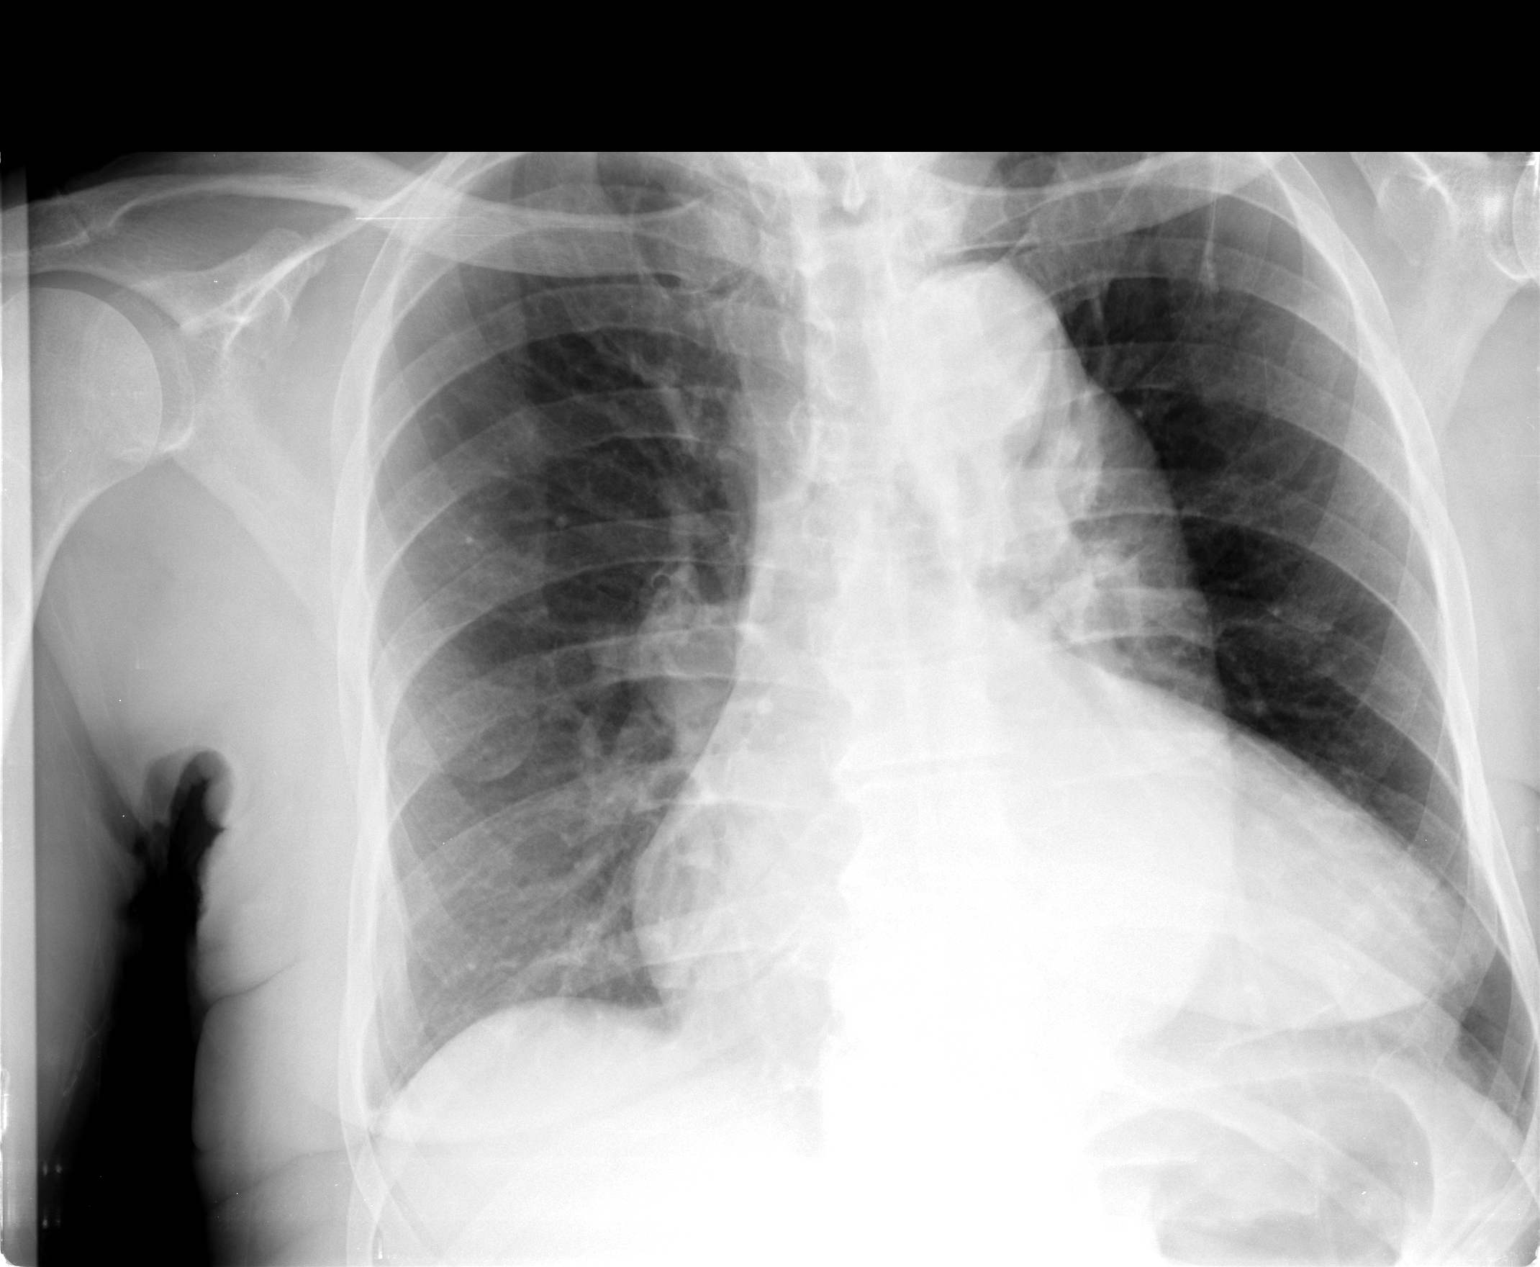

[view not recorded (2 of 2)]
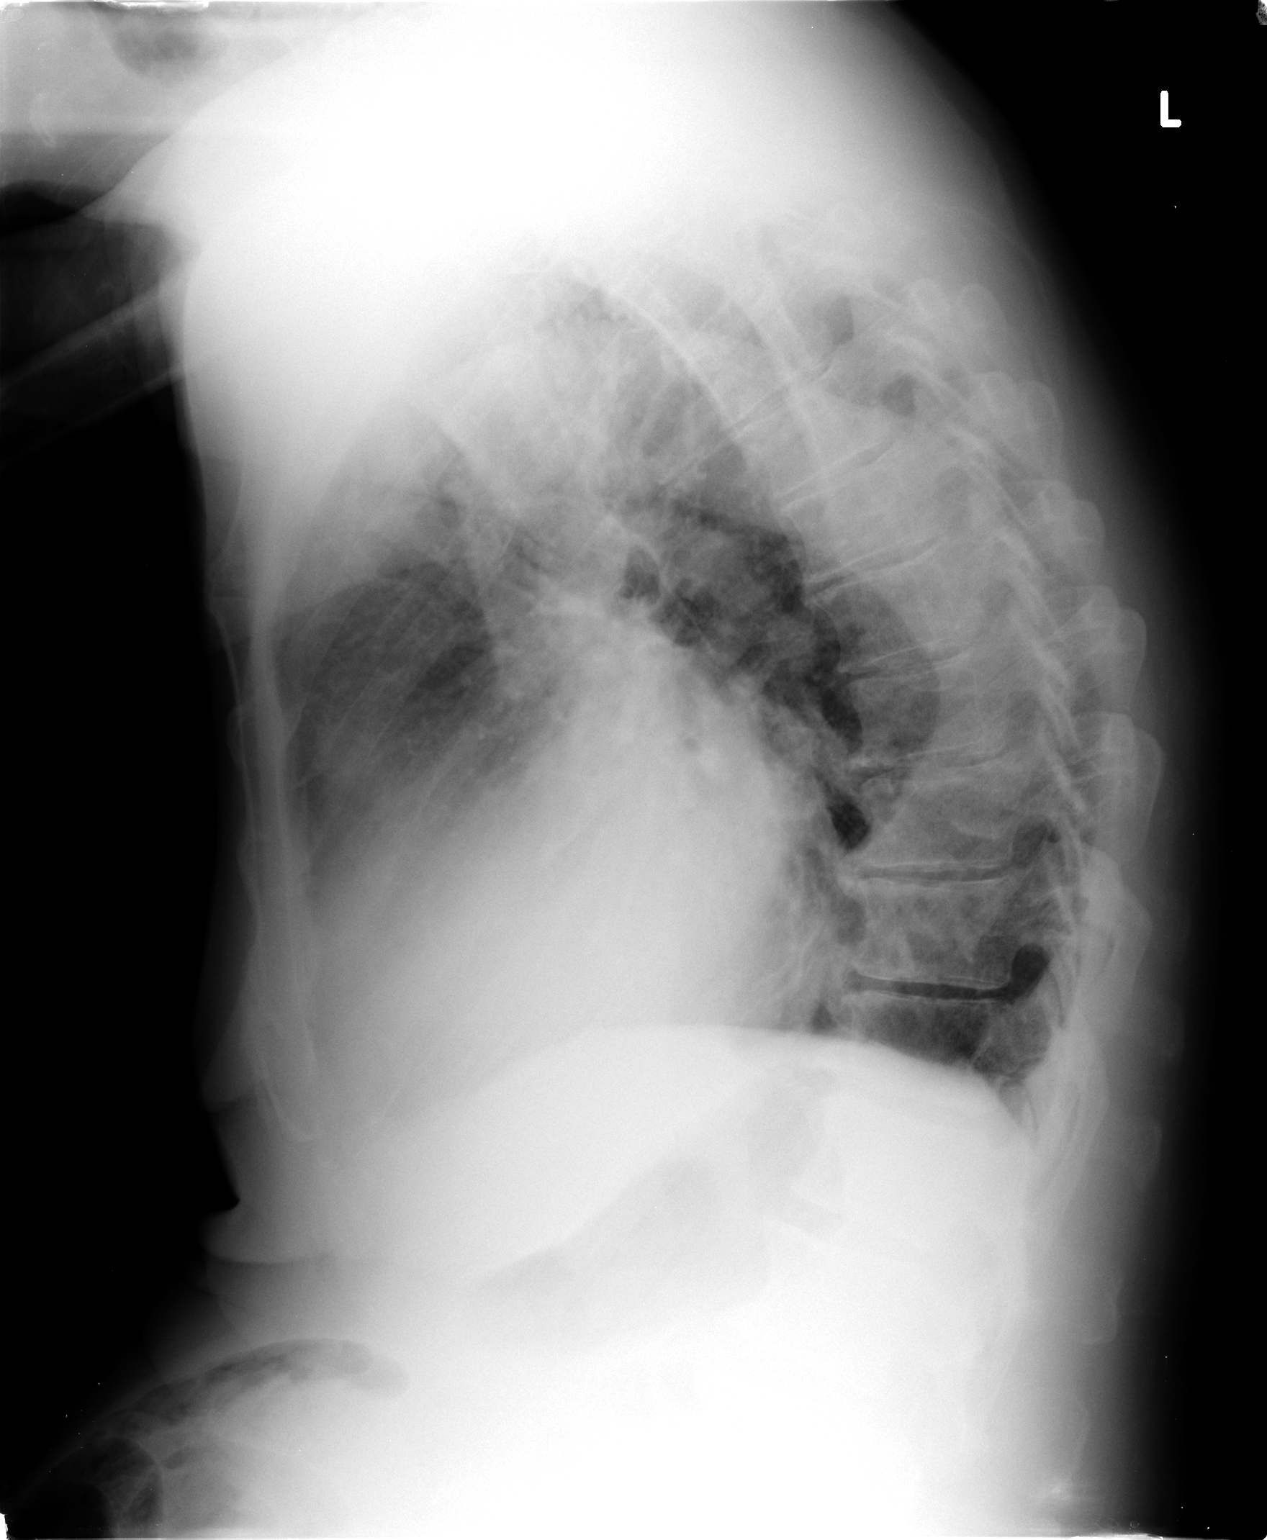

[2 of 2 positions shown; findings below may reference images not displayed]

FINDINGS: The cardiopericardial silhouette is enlarged.  Tortuosity
and unfolding of the thoracic aorta suggest underlying
hypertension.  No edema or focal airspace consolidation.  Right
hilar fullness is not substantially changed when comparing back to
an x-ray from 02/15/2011.
IMPRESSION: Enlargement the cardiopericardial silhouette is stable.  No new or
acute interval findings.

## 2014-05-05 ENCOUNTER — Ambulatory Visit: Payer: Medicare Other | Admitting: Internal Medicine

## 2014-06-03 ENCOUNTER — Ambulatory Visit: Payer: Medicare Other | Admitting: Internal Medicine

## 2014-07-06 ENCOUNTER — Encounter: Payer: Self-pay | Admitting: Internal Medicine

## 2014-07-06 ENCOUNTER — Ambulatory Visit (INDEPENDENT_AMBULATORY_CARE_PROVIDER_SITE_OTHER): Payer: Medicare Other | Admitting: Internal Medicine

## 2014-07-06 VITALS — BP 140/94 | HR 80 | Temp 99.1°F | Resp 20 | Ht 69.0 in | Wt 221.6 lb

## 2014-07-06 DIAGNOSIS — R609 Edema, unspecified: Secondary | ICD-10-CM

## 2014-07-06 DIAGNOSIS — R269 Unspecified abnormalities of gait and mobility: Secondary | ICD-10-CM

## 2014-07-06 DIAGNOSIS — I714 Abdominal aortic aneurysm, without rupture, unspecified: Secondary | ICD-10-CM

## 2014-07-06 DIAGNOSIS — E785 Hyperlipidemia, unspecified: Secondary | ICD-10-CM | POA: Diagnosis not present

## 2014-07-06 DIAGNOSIS — I10 Essential (primary) hypertension: Secondary | ICD-10-CM | POA: Diagnosis not present

## 2014-07-06 DIAGNOSIS — N184 Chronic kidney disease, stage 4 (severe): Secondary | ICD-10-CM

## 2014-07-06 NOTE — Progress Notes (Signed)
Patient ID: [name], male   DOB: 1926-03-25, 81 y.o.   MRN: [id number]    Location:    [facility]   Place of Service:   OFFICE   Advanced Directive information   NONE ON FILE  Chief Complaint  Patient presents with  . Establish Care    Establish care    HPI:   81 yo male seen today to establish care. He feels good overall. No falls. He uses a rolling walker to ambulate. He is taking PZT and learning to use a regular cane. He has a healthy appetite and sleeps well at night. He exercises 3 times per week, aerobic exercise.   He has a hx CKD ith last Cr 2.2 in Feb 2016. He has seen nephro and meds adjusted for HTN. Recommended keep SBP 130-140.   He has a AAA last imaged in Nov 2014. It was rather large 4.4 cm proximally and 7.3 x 6.8 cm distal at infrarenal aorta. He is not a surgical candidate due to advanced age and comorbidities  He lives at [facility]. He is a former Clinical cytogeneticist. Native of [place]. He has 7 siblings living. Sister present today.  Past Medical History  Diagnosis Date  . Diverticulosis     divertic bleed presmed in  2010  . Gout   . Leg swelling     left  . Hyperlipidemia   . History of myocardial infarction   . Olecranon bursitis   . Renal failure   . HTN (hypertension)   . Anemia   . Healthcare maintenance   . Prostate cancer   . AAA (abdominal aortic aneurysm)     Past Surgical History  Procedure Laterality Date  . R ia aneurysmectomy  2000    scar is in RLQ  . Prostate cryoablation  2006  . Suprapubic catheter placement  2006  . Colonoscopy  2006    Patient Care Team: Tanda Rockers, MD as PCP - General (Pulmonary Disease) Kathrynn Ducking, MD as Attending Physician (Neurology) Corliss Parish, MD as Consulting Physician (Nephrology)  History   Social History  . Marital Status: Single    Spouse Name: N/A  . Number of Children: N/A  . Years of Education: N/A   Occupational History  . retired    Social History  Main Topics  . Smoking status: Never Smoker   . Smokeless tobacco: Never Used  . Alcohol Use: No  . Drug Use: Not on file  . Sexual Activity: Not on file   Other Topics Concern  . 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 [employer].   Exercise, yes- strengthen and muscle exercises   Living Will- No    DNR- No    POA/HPOA- Yes            reports that he has never smoked. He has never used smokeless tobacco. He reports that he does not drink alcohol. His drug history is not on file.  Family History  Problem Relation Age of Onset  . Hypertension Brother   . Pancreatic cancer Brother   . Stroke Father   . Stroke Mother   . CAD  Mother   . Diabetes Brother   . Breast cancer Sister   . Heart disease Sister   . High blood pressure Sister   . Glaucoma Sister   . High blood pressure Sister   . High Cholesterol Sister   . High blood pressure Sister   . High Cholesterol Sister   . GER disease Sister    Family Status  Relation Status Death Age  . Father Deceased   . Mother Deceased   . Brother Deceased   . Brother Alive   . Sister Deceased   . Sister Deceased   . Brother Alive   . Sister Alive   . Sister Alive   . Sister Alive   . Sister Alive   . Sister Alive     Immunization History  Administered Date(s) Administered  . Influenza Split 11/11/2011  . Influenza Whole 11/25/2005, 02/06/2009, 02/08/2010, 11/06/2010  . Pneumococcal Polysaccharide-23 10/27/2002  . Td 10/26/2001  . Tdap 03/04/2012    No Known Allergies  Medications: Patient's Medications  New Prescriptions   No medications on file  Previous Medications   ACETAMINOPHEN (TYLENOL) 325 MG TABLET    Take 650 mg by mouth 2  (two) times daily as needed. For pain   ALLOPURINOL (ZYLOPRIM) 300 MG TABLET    Take 300 mg by mouth daily.   ATENOLOL (TENORMIN) 50 MG TABLET    One twice daily   DOCUSATE SODIUM 100 MG CAPS    Take 100 mg by mouth 2 (two) times daily.   ENSURE (ENSURE)    Take 237 mLs by mouth daily.   FEEDING SUPPLEMENT (PRO-STAT SUGAR FREE 64) LIQD    Take 30 mLs by mouth daily.   FUROSEMIDE (LASIX) 40 MG TABLET    Two daily   ISOSORBIDE-HYDRALAZINE (BIDIL) 20-37.5 MG PER TABLET    Take 1 tablet by mouth 3 (three) times daily.   MINOXIDIL (LONITEN) 10 MG TABLET    One twice daily   MULTIPLE VITAMINS-MINERALS (CERTAVITE SENIOR/ANTIOXIDANT) TABS    Take 1 tablet by mouth daily.   POLYETHYLENE GLYCOL (MIRALAX / GLYCOLAX) PACKET    Take 17 g by mouth daily.    SKIN PROTECTANTS, MISC. (EUCERIN) CREAM    Apply 1 application topically as needed for dry skin.  Modified Medications   No medications on file  Discontinued Medications   No medications on file    Review of Systems  Eyes: Positive for visual disturbance (corrective lenses).  Gastrointestinal: Positive for constipation.  Musculoskeletal: Positive for joint swelling, arthralgias and gait problem.       Hx osteoporosis  All other systems reviewed and are negative.   Filed Vitals:   07/06/14 1336  BP: 140/94  Pulse: 80  Temp: 99.1 F (37.3 C)  TempSrc: Oral  Resp: 20  Height: 5\' 9"  (1.753 m)  Weight: 221 lb 9.6 oz (100.517 kg)  SpO2: 96%   Body mass index is 32.71 kg/(m^2).  Physical Exam  Constitutional: He is oriented to person, place, and time. He appears well-developed and well-nourished.  HENT:  Mouth/Throat: Oropharynx is clear and moist.  Eyes: Pupils are equal, round, and reactive to light. No scleral icterus.  Neck: Neck supple. No thyromegaly present.  Cardiovascular: Normal rate, regular rhythm and intact distal pulses.  Exam reveals no gallop and no friction rub.   Murmur (2/6 SEM) heard. (+) carotid bruit b/l; +1  pitting distal LE swelling  Pulmonary/Chest: Effort normal and breath sounds normal. He has no wheezes. He has  no rales. He exhibits no tenderness.  Abdominal: Soft. Bowel sounds are normal. He exhibits abdominal bruit and pulsatile midline mass. He exhibits no distension and no mass. There is no hepatomegaly. There is no tenderness. There is no rebound and no guarding.  Musculoskeletal: He exhibits edema and tenderness.  Uses walker to ambulate; shuffling gait  Lymphadenopathy:    He has no cervical adenopathy.  Neurological: He is alert and oriented to person, place, and time. He has normal reflexes.  Skin: Skin is warm and dry. No rash noted.  Psychiatric: He has a normal mood and affect. His behavior is normal. Judgment and thought content normal.     Labs reviewed: No visits with results within 3 Month(s) from this visit. Latest known visit with results is:  Appointment on 12/17/2012  Component Date Value Ref Range Status  . Sodium 12/17/2012 143  135 - 145 mEq/L Final  . Potassium 12/17/2012 5.3* 3.5 - 5.1 mEq/L Final  . Chloride 12/17/2012 109  96 - 112 mEq/L Final  . CO2 12/17/2012 26  19 - 32 mEq/L Final  . Glucose, Bld 12/17/2012 117* 70 - 99 mg/dL Final  . BUN 12/17/2012 72* 6 - 23 mg/dL Final  . Creatinine, Ser 12/17/2012 2.7* 0.4 - 1.5 mg/dL Final  . Calcium 12/17/2012 9.2  8.4 - 10.5 mg/dL Final  . GFR 12/17/2012 28.94* >60.00 mL/min Final  . WBC 12/17/2012 8.8  4.5 - 10.5 K/uL Final  . RBC 12/17/2012 3.08* 4.22 - 5.81 Mil/uL Final  . Hemoglobin 12/17/2012 10.0* 13.0 - 17.0 g/dL Final  . HCT 12/17/2012 29.6* 39.0 - 52.0 % Final  . MCV 12/17/2012 96.3  78.0 - 100.0 fl Final  . MCHC 12/17/2012 33.7  30.0 - 36.0 g/dL Final  . RDW 12/17/2012 14.3  11.5 - 14.6 % Final  . Platelets 12/17/2012 185.0  150.0 - 400.0 K/uL Final  . Neutrophils Relative % 12/17/2012 76.4  43.0 - 77.0 % Final  . Lymphocytes Relative 12/17/2012 12.7  12.0 - 46.0 % Final  . Monocytes Relative  12/17/2012 8.6  3.0 - 12.0 % Final  . Eosinophils Relative 12/17/2012 1.6  0.0 - 5.0 % Final  . Basophils Relative 12/17/2012 0.7  0.0 - 3.0 % Final  . Neutro Abs 12/17/2012 6.7  1.4 - 7.7 K/uL Final  . Lymphs Abs 12/17/2012 1.1  0.7 - 4.0 K/uL Final  . Monocytes Absolute 12/17/2012 0.8  0.1 - 1.0 K/uL Final  . Eosinophils Absolute 12/17/2012 0.1  0.0 - 0.7 K/uL Final  . Basophils Absolute 12/17/2012 0.1  0.0 - 0.1 K/uL Final    No results found.   Assessment/Plan   ICD-9-CM ICD-10-CM   1. Essential hypertension - stable; cont med 401.9 I10 CBC with Differential     TSH  2. EDEMA, CHRONIC - stable; cont med 782.3 R60.9 CMP  3. Chronic kidney disease, stage 4 (severe) - stable 585.4 N18.4 CMP  4. Abdominal aortic aneurysm - conservative mx; cont observation as he is not a surgical candidate 441.4 I71.4   5. Hyperlipidemia - stable 272.4 E78.5 Lipid Panel  6. Gait abnormality - cont PT 781.2 R26.9     --discussed advanced directives. Blank MOST form and DNR form given to pt to discuss with family.  --Continue current medications as ordered  --Have [facility] send copy of advance directives to our office. Discuss DNR and MOST form with family  --Follow up in 6 mos for CPE/medicare  --Will call with lab results  [place].  Perlie Gold  [facility] [address] [place], [place] [address] [phone] Cell (Monday-Friday 8 AM - 5 PM) [phone] After 5 PM and follow prompts

## 2014-07-06 NOTE — Patient Instructions (Addendum)
Continue current medications as ordered  Have [facility] send copy of advance directives to our office. Discuss DNR and MOST form with family  Follow up in 6 mos  Will call with lab results

## 2014-07-07 LAB — CBC WITH DIFFERENTIAL/PLATELET
BASOS ABS: 0 10*3/uL (ref 0.0–0.2)
Basos: 0 %
EOS (ABSOLUTE): 0.1 10*3/uL (ref 0.0–0.4)
Eos: 1 %
HEMATOCRIT: 31.8 % — AB (ref 37.5–51.0)
Hemoglobin: 10.4 g/dL — ABNORMAL LOW (ref 12.6–17.7)
Immature Grans (Abs): 0 10*3/uL (ref 0.0–0.1)
Immature Granulocytes: 0 %
LYMPHS ABS: 1 10*3/uL (ref 0.7–3.1)
Lymphs: 14 %
MCH: 30.2 pg (ref 26.6–33.0)
MCHC: 32.7 g/dL (ref 31.5–35.7)
MCV: 92 fL (ref 79–97)
MONOS ABS: 0.6 10*3/uL (ref 0.1–0.9)
Monocytes: 8 %
Neutrophils Absolute: 5.3 10*3/uL (ref 1.4–7.0)
Neutrophils: 77 %
Platelets: 170 10*3/uL (ref 150–379)
RBC: 3.44 x10E6/uL — ABNORMAL LOW (ref 4.14–5.80)
RDW: 14.7 % (ref 12.3–15.4)
WBC: 6.9 10*3/uL (ref 3.4–10.8)

## 2014-07-07 LAB — COMPREHENSIVE METABOLIC PANEL
A/G RATIO: 1.5 (ref 1.1–2.5)
ALT: 12 IU/L (ref 0–44)
AST: 20 IU/L (ref 0–40)
Albumin: 4.4 g/dL (ref 3.5–4.7)
Alkaline Phosphatase: 77 IU/L (ref 39–117)
BILIRUBIN TOTAL: 0.4 mg/dL (ref 0.0–1.2)
BUN/Creatinine Ratio: 25 — ABNORMAL HIGH (ref 10–22)
BUN: 45 mg/dL — AB (ref 8–27)
CALCIUM: 8.9 mg/dL (ref 8.6–10.2)
CO2: 25 mmol/L (ref 18–29)
Chloride: 99 mmol/L (ref 97–108)
Creatinine, Ser: 1.8 mg/dL — ABNORMAL HIGH (ref 0.76–1.27)
GFR calc non Af Amer: 33 mL/min/{1.73_m2} — ABNORMAL LOW (ref >59)
GFR, EST AFRICAN AMERICAN: 39 mL/min/{1.73_m2} — AB (ref >59)
GLUCOSE: 147 mg/dL — AB (ref 65–99)
Globulin, Total: 2.9 g/dL (ref 1.5–4.5)
POTASSIUM: 4.2 mmol/L (ref 3.5–5.2)
Sodium: 142 mmol/L (ref 134–144)
Total Protein: 7.3 g/dL (ref 6.0–8.5)

## 2014-07-07 LAB — LIPID PANEL
Chol/HDL Ratio: 4.7 ratio units (ref 0.0–5.0)
Cholesterol, Total: 189 mg/dL (ref 100–199)
HDL: 40 mg/dL (ref >39)
LDL CALC: 128 mg/dL — AB (ref 0–99)
TRIGLYCERIDES: 105 mg/dL (ref 0–149)
VLDL Cholesterol Cal: 21 mg/dL (ref 5–40)

## 2014-07-07 LAB — TSH: TSH: 2.74 u[IU]/mL (ref 0.450–4.500)

## 2014-09-05 ENCOUNTER — Telehealth: Payer: Self-pay | Admitting: Internal Medicine

## 2014-09-05 NOTE — Telephone Encounter (Signed)
Called and LMTCB x1 for [employer]

## 2014-09-06 NOTE — Telephone Encounter (Signed)
[facility] called [facility] and wanted to inform us that they have gotten this taken care of through pt pcp.Hillery Hunter

## 2014-09-06 NOTE — Telephone Encounter (Signed)
Called [facility] and left message with Layla for Melissa to call back when she is available.

## 2014-09-07 ENCOUNTER — Ambulatory Visit: Payer: Medicare Other | Admitting: Internal Medicine

## 2014-09-09 ENCOUNTER — Ambulatory Visit: Payer: Medicare Other | Admitting: Internal Medicine

## 2014-09-09 ENCOUNTER — Ambulatory Visit (INDEPENDENT_AMBULATORY_CARE_PROVIDER_SITE_OTHER): Payer: Medicare Other | Admitting: Internal Medicine

## 2014-09-09 ENCOUNTER — Encounter: Payer: Self-pay | Admitting: Internal Medicine

## 2014-09-09 VITALS — BP 150/88 | HR 73 | Temp 98.3°F | Resp 20 | Ht 69.0 in

## 2014-09-09 DIAGNOSIS — L729 Follicular cyst of the skin and subcutaneous tissue, unspecified: Secondary | ICD-10-CM

## 2014-09-09 DIAGNOSIS — L089 Local infection of the skin and subcutaneous tissue, unspecified: Secondary | ICD-10-CM

## 2014-09-09 NOTE — Progress Notes (Signed)
Patient ID: [name], male   DOB: 10/16/1926, 81 y.o.   MRN: [id number]    Location:    [facility]   Place of Service:   OFFICE  Chief Complaint  Patient presents with  . Acute Visit    Infection on arm    HPI:  81 yo male seen today for infection on left wrist. He has a bump on wrist chronically but last weekend, it began to drain and SNF cleaned, applied topical abx and wrapped it. No pain. No f/c. No numbness or tingling. No known trauma or insect bites. Brother present today.   He has a hx gout and takes allopurinol for gout prevention. No gout attacks.   His brother is present today  Past Medical History  Diagnosis Date  . Diverticulosis     divertic bleed presmed in  2010  . Gout   . Leg swelling     left  . Hyperlipidemia   . History of myocardial infarction   . Olecranon bursitis   . Renal failure   . HTN (hypertension)   . Anemia   . Healthcare maintenance   . Prostate cancer   . AAA (abdominal aortic aneurysm)     Past Surgical History  Procedure Laterality Date  . R ia aneurysmectomy  2000    scar is in RLQ  . Prostate cryoablation  2006  . Suprapubic catheter placement  2006  . Colonoscopy  2006    Patient Care Team: Gildardo Cranker, DO as PCP - General (Internal Medicine) Kathrynn Ducking, MD as Attending Physician (Neurology) Corliss Parish, MD as Consulting Physician (Nephrology)  History   Social History  . Marital Status: Single    Spouse Name: N/A  . Number of Children: N/A  . Years of Education: N/A   Occupational History  . retired    Social History Main Topics  . Smoking status: Never Smoker   . Smokeless tobacco: Never Used  . Alcohol Use: No  . Drug Use: Not on file  . Sexual Activity: Not on file   Other Topics Concern  . 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   Exercise, yes- strengthen and muscle exercises   Living Will- No    DNR- No    POA/HPOA- Yes            reports that he has never smoked. He has never used smokeless tobacco. He reports that he does not drink alcohol. His drug history is not on file.  No Known Allergies  Medications: Patient's Medications  New Prescriptions   No medications on file  Previous Medications   ACETAMINOPHEN (TYLENOL) 325 MG TABLET    Take 650 mg by mouth 2 (two) times daily as needed. For pain   ALLOPURINOL (ZYLOPRIM) 300 MG TABLET    Take 300 mg by mouth daily.   ATENOLOL (TENORMIN) 50 MG TABLET    One twice daily   DOCUSATE SODIUM 100 MG CAPS    Take 100 mg by mouth 2 (two) times daily.   ENSURE (ENSURE)    Take 237 mLs by mouth daily.   FEEDING SUPPLEMENT (PRO-STAT SUGAR FREE  64) LIQD    Take 30 mLs by mouth daily.   FUROSEMIDE (LASIX) 40 MG TABLET    Two daily   HYDRALAZINE (APRESOLINE) 25 MG TABLET    Take 25 mg by mouth 3 (three) times daily.   ISOSORBIDE DINITRATE (ISORDIL) 20 MG TABLET    Take 20 mg by mouth 3 (three) times daily.   MINOXIDIL (LONITEN) 10 MG TABLET    One twice daily   MULTIPLE VITAMINS-MINERALS (CERTAVITE SENIOR/ANTIOXIDANT) TABS    Take 1 tablet by mouth daily.   POLYETHYLENE GLYCOL (MIRALAX / GLYCOLAX) PACKET    Take 17 g by mouth daily.    SKIN PROTECTANTS, MISC. (EUCERIN) CREAM    Apply 1 application topically as needed for dry skin.  Modified Medications   No medications on file  Discontinued Medications   No medications on file    Review of Systems  Constitutional: Negative for fever and chills.  Musculoskeletal: Positive for joint swelling and arthralgias.  Skin: Positive for wound.  Neurological: Negative for weakness and numbness.    Filed Vitals:   09/09/14 0905  BP: 150/88  Pulse: 73  Temp: 98.3 F (36.8 C)  TempSrc:  Oral  Resp: 20  Height: 5\' 9"  (1.753 m)  SpO2: 97%   There is no weight on file to calculate BMI.  Physical Exam  Constitutional: He is oriented to person, place, and time. He appears well-developed and well-nourished. No distress.  Musculoskeletal: He exhibits edema.  Gait unsteady  Neurological: He is alert and oriented to person, place, and time.  Skin: Skin is warm and dry.     Psychiatric: He has a normal mood and affect. His behavior is normal. Thought content normal.     Labs reviewed: Office Visit on 07/06/2014  Component Date Value Ref Range Status  . WBC 07/06/2014 6.9  3.4 - 10.8 x10E3/uL Final  . RBC 07/06/2014 3.44* 4.14 - 5.80 x10E6/uL Final  . Hemoglobin 07/06/2014 10.4* 12.6 - 17.7 g/dL Final  . Hematocrit 07/06/2014 31.8* 37.5 - 51.0 % Final  . MCV 07/06/2014 92  79 - 97 fL Final  . MCH 07/06/2014 30.2  26.6 - 33.0 pg Final  . MCHC 07/06/2014 32.7  31.5 - 35.7 g/dL Final  . RDW 07/06/2014 14.7  12.3 - 15.4 % Final  . Platelets 07/06/2014 170  150 - 379 x10E3/uL Final  . Neutrophils 07/06/2014 77   Final  . Lymphs 07/06/2014 14   Final  . Monocytes 07/06/2014 8   Final  . Eos 07/06/2014 1   Final  . Basos 07/06/2014 0   Final  . Neutrophils Absolute 07/06/2014 5.3  1.4 - 7.0 x10E3/uL Final  . Lymphocytes Absolute 07/06/2014 1.0  0.7 - 3.1 x10E3/uL Final  . Monocytes Absolute 07/06/2014 0.6  0.1 - 0.9 x10E3/uL Final  . EOS (ABSOLUTE) 07/06/2014 0.1  0.0 - 0.4 x10E3/uL Final  . Basophils Absolute 07/06/2014 0.0  0.0 - 0.2 x10E3/uL Final  . Immature Granulocytes 07/06/2014 0   Final  . Immature Grans (Abs) 07/06/2014 0.0  0.0 - 0.1 x10E3/uL Final  . Glucose 07/06/2014 147* 65 - 99 mg/dL Final  . BUN 07/06/2014 45* 8 - 27 mg/dL Final  . Creatinine, Ser 07/06/2014 1.80* 0.76 - 1.27 mg/dL Final  . GFR calc non Af Amer 07/06/2014 33* >59 mL/min/1.73 Final  . GFR calc Af Amer 07/06/2014 39* >59 mL/min/1.73 Final  . BUN/Creatinine Ratio 07/06/2014 25* 10 - 22  Final  . Sodium 07/06/2014 142  134 -  144 mmol/L Final  . Potassium 07/06/2014 4.2  3.5 - 5.2 mmol/L Final  . Chloride 07/06/2014 99  97 - 108 mmol/L Final  . CO2 07/06/2014 25  18 - 29 mmol/L Final  . Calcium 07/06/2014 8.9  8.6 - 10.2 mg/dL Final  . Total Protein 07/06/2014 7.3  6.0 - 8.5 g/dL Final  . Albumin 07/06/2014 4.4  3.5 - 4.7 g/dL Final  . Globulin, Total 07/06/2014 2.9  1.5 - 4.5 g/dL Final  . Albumin/Globulin Ratio 07/06/2014 1.5  1.1 - 2.5 Final  . Bilirubin Total 07/06/2014 0.4  0.0 - 1.2 mg/dL Final  . Alkaline Phosphatase 07/06/2014 77  39 - 117 IU/L Final  . AST 07/06/2014 20  0 - 40 IU/L Final  . ALT 07/06/2014 12  0 - 44 IU/L Final  . TSH 07/06/2014 2.740  0.450 - 4.500 uIU/mL Final  . Cholesterol, Total 07/06/2014 189  100 - 199 mg/dL Final  . Triglycerides 07/06/2014 105  0 - 149 mg/dL Final  . HDL 07/06/2014 40  >39 mg/dL Final   Comment: According to ATP-III Guidelines, HDL-C >59 mg/dL is considered a negative risk factor for CHD.   Marland Kitchen VLDL Cholesterol Cal 07/06/2014 21  5 - 40 mg/dL Final  . LDL Calculated 07/06/2014 128* 0 - 99 mg/dL Final  . Chol/HDL Ratio 07/06/2014 4.7  0.0 - 5.0 ratio units Final   Comment:                                   T. Chol/HDL Ratio                                             Men  Women                               1/2 Avg.Risk  3.4    3.3                                   Avg.Risk  5.0    4.4                                2X Avg.Risk  9.6    7.1                                3X Avg.Risk 23.4   11.0     No results found.   Assessment/Plan   ICD-9-CM ICD-10-CM   1. Infected cyst of skin 706.2 L72.9    --Continue daily dressing changes by wound care  --Take doxycycline twice daily x 10 days. Avoid prolonged exposure to sunlight while on med  --Follow up in 2 weeks to reck site  [place] S. Perlie Gold  [facility] [address] [place], [place]  [address] [phone] Cell (Monday-Friday 8 AM - 5 PM) [phone] After 5 PM and follow prompts

## 2014-09-09 NOTE — Patient Instructions (Addendum)
Continue daily dressing changes by wound care  Take doxycycline twice daily x 10 days. Avoid prolonged exposure to sunlight while on med  Follow up in 2 weeks to reck site

## 2014-09-21 ENCOUNTER — Emergency Department (HOSPITAL_COMMUNITY): Payer: Medicare Other

## 2014-09-21 ENCOUNTER — Encounter (HOSPITAL_COMMUNITY): Payer: Self-pay | Admitting: Emergency Medicine

## 2014-09-21 ENCOUNTER — Ambulatory Visit (INDEPENDENT_AMBULATORY_CARE_PROVIDER_SITE_OTHER): Payer: Medicare Other | Admitting: Internal Medicine

## 2014-09-21 ENCOUNTER — Encounter: Payer: Self-pay | Admitting: Internal Medicine

## 2014-09-21 ENCOUNTER — Inpatient Hospital Stay (HOSPITAL_COMMUNITY)
Admission: EM | Admit: 2014-09-21 | Discharge: 2014-09-22 | DRG: 299 | Disposition: A | Discharge status: Home / Self Care | Payer: Medicare Other | Attending: Internal Medicine | Admitting: Internal Medicine

## 2014-09-21 VITALS — BP 140/80 | HR 70 | Temp 98.1°F | Resp 20 | Ht 69.0 in | Wt 213.4 lb

## 2014-09-21 DIAGNOSIS — Z515 Encounter for palliative care: Secondary | ICD-10-CM

## 2014-09-21 DIAGNOSIS — M109 Gout, unspecified: Secondary | ICD-10-CM | POA: Diagnosis present

## 2014-09-21 DIAGNOSIS — N189 Chronic kidney disease, unspecified: Secondary | ICD-10-CM | POA: Diagnosis present

## 2014-09-21 DIAGNOSIS — L089 Local infection of the skin and subcutaneous tissue, unspecified: Secondary | ICD-10-CM

## 2014-09-21 DIAGNOSIS — Z791 Long term (current) use of non-steroidal anti-inflammatories (NSAID): Secondary | ICD-10-CM

## 2014-09-21 DIAGNOSIS — I10 Essential (primary) hypertension: Secondary | ICD-10-CM | POA: Diagnosis present

## 2014-09-21 DIAGNOSIS — N183 Chronic kidney disease, stage 3 (moderate): Secondary | ICD-10-CM | POA: Diagnosis present

## 2014-09-21 DIAGNOSIS — Z79899 Other long term (current) drug therapy: Secondary | ICD-10-CM

## 2014-09-21 DIAGNOSIS — I729 Aneurysm of unspecified site: Secondary | ICD-10-CM | POA: Diagnosis present

## 2014-09-21 DIAGNOSIS — I723 Aneurysm of iliac artery: Principal | ICD-10-CM | POA: Diagnosis present

## 2014-09-21 DIAGNOSIS — L729 Follicular cyst of the skin and subcutaneous tissue, unspecified: Secondary | ICD-10-CM | POA: Diagnosis not present

## 2014-09-21 DIAGNOSIS — Z8546 Personal history of malignant neoplasm of prostate: Secondary | ICD-10-CM

## 2014-09-21 DIAGNOSIS — E785 Hyperlipidemia, unspecified: Secondary | ICD-10-CM | POA: Diagnosis present

## 2014-09-21 DIAGNOSIS — I252 Old myocardial infarction: Secondary | ICD-10-CM

## 2014-09-21 DIAGNOSIS — I714 Abdominal aortic aneurysm, without rupture, unspecified: Secondary | ICD-10-CM

## 2014-09-21 DIAGNOSIS — Z66 Do not resuscitate: Secondary | ICD-10-CM | POA: Diagnosis present

## 2014-09-21 DIAGNOSIS — I129 Hypertensive chronic kidney disease with stage 1 through stage 4 chronic kidney disease, or unspecified chronic kidney disease: Secondary | ICD-10-CM | POA: Diagnosis present

## 2014-09-21 DIAGNOSIS — R55 Syncope and collapse: Secondary | ICD-10-CM | POA: Diagnosis present

## 2014-09-21 DIAGNOSIS — I7772 Dissection of iliac artery: Secondary | ICD-10-CM | POA: Diagnosis present

## 2014-09-21 DIAGNOSIS — I959 Hypotension, unspecified: Secondary | ICD-10-CM | POA: Diagnosis present

## 2014-09-21 LAB — COMPREHENSIVE METABOLIC PANEL
ALT: 17 U/L (ref 17–63)
ANION GAP: 15 (ref 5–15)
AST: 40 U/L (ref 15–41)
Albumin: 3.9 g/dL (ref 3.5–5.0)
Alkaline Phosphatase: 74 U/L (ref 38–126)
BUN: 58 mg/dL — ABNORMAL HIGH (ref 6–20)
CO2: 24 mmol/L (ref 22–32)
Calcium: 9.1 mg/dL (ref 8.9–10.3)
Chloride: 105 mmol/L (ref 101–111)
Creatinine, Ser: 3.01 mg/dL — ABNORMAL HIGH (ref 0.61–1.24)
GFR calc Af Amer: 20 mL/min — ABNORMAL LOW (ref >60)
GFR calc non Af Amer: 17 mL/min — ABNORMAL LOW (ref >60)
Glucose, Bld: 138 mg/dL — ABNORMAL HIGH (ref 65–99)
Potassium: 4.3 mmol/L (ref 3.5–5.1)
Sodium: 144 mmol/L (ref 135–145)
Total Bilirubin: 0.7 mg/dL (ref 0.3–1.2)
Total Protein: 7.5 g/dL (ref 6.5–8.1)

## 2014-09-21 LAB — TROPONIN I: Troponin I: 0.06 ng/mL — ABNORMAL HIGH (ref <0.031)

## 2014-09-21 LAB — TYPE AND SCREEN
ABO/RH(D): O POS
Antibody Screen: NEGATIVE

## 2014-09-21 LAB — I-STAT CREATININE, ED: Creatinine, Ser: 2.9 mg/dL — ABNORMAL HIGH (ref 0.61–1.24)

## 2014-09-21 LAB — I-STAT CG4 LACTIC ACID, ED: Lactic Acid, Venous: 5.29 mmol/L (ref 0.5–2.0)

## 2014-09-21 LAB — LIPASE, BLOOD: LIPASE: 26 U/L (ref 22–51)

## 2014-09-21 MED ORDER — SODIUM CHLORIDE 0.9 % IV BOLUS (SEPSIS)
1000.0000 mL | INTRAVENOUS | Status: AC
  Administered 2014-09-21 (×2): 1000 mL via INTRAVENOUS

## 2014-09-21 MED ORDER — SODIUM CHLORIDE 0.9 % IV BOLUS (SEPSIS)
1000.0000 mL | Freq: Once | INTRAVENOUS | Status: AC
  Administered 2014-09-21: 1000 mL via INTRAVENOUS

## 2014-09-21 MED ORDER — VANCOMYCIN HCL IN DEXTROSE 1-5 GM/200ML-% IV SOLN
1000.0000 mg | INTRAVENOUS | Status: DC
Start: 2014-09-23 — End: 2014-09-22

## 2014-09-21 MED ORDER — PIPERACILLIN-TAZOBACTAM IN DEX 2-0.25 GM/50ML IV SOLN
2.2500 g | Freq: Four times a day (QID) | INTRAVENOUS | Status: DC
Start: 2014-09-22 — End: 2014-09-22
  Filled 2014-09-21 (×2): qty 50

## 2014-09-21 MED ORDER — PIPERACILLIN-TAZOBACTAM 3.375 G IVPB 30 MIN
3.3750 g | Freq: Once | INTRAVENOUS | Status: AC
  Administered 2014-09-21: 3.375 g via INTRAVENOUS
  Filled 2014-09-21: qty 50

## 2014-09-21 MED ORDER — VANCOMYCIN HCL IN DEXTROSE 1-5 GM/200ML-% IV SOLN
1000.0000 mg | Freq: Once | INTRAVENOUS | Status: AC
  Administered 2014-09-21: 1000 mg via INTRAVENOUS
  Filled 2014-09-21: qty 200

## 2014-09-21 MED ORDER — PIPERACILLIN-TAZOBACTAM IN DEX 2-0.25 GM/50ML IV SOLN
2.2500 g | Freq: Four times a day (QID) | INTRAVENOUS | Status: DC
  Filled 2014-09-21 (×3): qty 50

## 2014-09-21 MED ORDER — VANCOMYCIN HCL IN DEXTROSE 750-5 MG/150ML-% IV SOLN
750.0000 mg | Freq: Once | INTRAVENOUS | Status: DC
  Filled 2014-09-21: qty 150

## 2014-09-21 NOTE — Progress Notes (Signed)
Patient ID: [name], male   DOB: 08/29/1926, 81 y.o.   MRN: [id number]    Location:    [facility]    Place of Service:  OFFICE   Chief Complaint  Patient presents with  . Follow-up    left forearm cyst    HPI:  39 male seen today for f/u left elbow infected cyst. He completed 10 days doxy. He still has some drainage but has been keeping a bandaid over the wound. No f/c. No HA or dizziness. His daughter is present.  He has increased fatigue today due to extreme heat  BP/edema controlled on hydralazine, minoxidil, lasix and isordil.  No gout attacks on allopurinol  [facility] at [facility]  Past Medical History  Diagnosis Date  . Diverticulosis     divertic bleed presmed in  2010  . Gout   . Leg swelling     left  . Hyperlipidemia   . History of myocardial infarction   . Olecranon bursitis   . Renal failure   . HTN (hypertension)   . Anemia   . Healthcare maintenance   . Prostate cancer   . AAA (abdominal aortic aneurysm)     Past Surgical History  Procedure Laterality Date  . R ia aneurysmectomy  2000    scar is in RLQ  . Prostate cryoablation  2006  . Suprapubic catheter placement  2006  . Colonoscopy  2006    Patient Care Team: Gildardo Cranker, DO as PCP - General (Internal Medicine) Kathrynn Ducking, MD as Attending Physician (Neurology) Corliss Parish, MD as Consulting Physician (Nephrology)  History   Social History  . Marital Status: Single    Spouse Name: N/A  . Number of Children: N/A  . Years of Education: N/A   Occupational History  . retired    Social History Main Topics  . Smoking status: Never Smoker   . Smokeless tobacco: Never Used  . Alcohol Use: No  . Drug Use: Not on file  . Sexual Activity: Not on file   Other Topics Concern  . 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   Exercise, yes- strengthen and muscle exercises   Living Will- No    DNR- No    POA/HPOA- Yes            reports that he has never smoked. He has never used smokeless tobacco. He reports that he does not drink alcohol. His drug history is not on file.  No Known Allergies  Medications: Patient's Medications  New Prescriptions   No medications on file  Previous Medications   ACETAMINOPHEN (TYLENOL) 325 MG TABLET    Take 650 mg by mouth 2 (two) times daily as needed. For pain   ALLOPURINOL (ZYLOPRIM) 300 MG TABLET    Take 300 mg by mouth daily.   ATENOLOL (TENORMIN) 50 MG TABLET    One twice daily   DOCUSATE SODIUM 100 MG CAPS    Take 100 mg by mouth 2 (two) times daily.   ENSURE (ENSURE)    Take 237 mLs by mouth daily.   FEEDING SUPPLEMENT (PRO-STAT SUGAR FREE 64) LIQD  Take 30 mLs by mouth daily.   FUROSEMIDE (LASIX) 40 MG TABLET    Two daily   HYDRALAZINE (APRESOLINE) 25 MG TABLET    Take 25 mg by mouth 3 (three) times daily.   ISOSORBIDE DINITRATE (ISORDIL) 20 MG TABLET    Take 20 mg by mouth 3 (three) times daily.   MINOXIDIL (LONITEN) 10 MG TABLET    One twice daily   MULTIPLE VITAMINS-MINERALS (CERTAVITE SENIOR/ANTIOXIDANT) TABS    Take 1 tablet by mouth daily.   POLYETHYLENE GLYCOL (MIRALAX / GLYCOLAX) PACKET    Take 17 g by mouth daily.    SKIN PROTECTANTS, MISC. (EUCERIN) CREAM    Apply 1 application topically as needed for dry skin.  Modified Medications   No medications on file  Discontinued Medications   No medications on file    Review of Systems  Constitutional: Positive for fatigue. Negative for activity change.  Respiratory: Negative for cough and shortness of breath.   Cardiovascular: Positive for leg swelling. Negative for chest pain and palpitations.  Musculoskeletal: Positive for joint swelling, arthralgias and gait  problem.  Skin: Positive for rash and wound.  Neurological: Negative for weakness.  Psychiatric/Behavioral: Negative for confusion and agitation.    Filed Vitals:   09/21/14 1428  BP: 140/80  Pulse: 70  Temp: 98.1 F (36.7 C)  TempSrc: Oral  Resp: 20  Height: 5\' 9"  (1.753 m)  Weight: 213 lb 6.4 oz (96.798 kg)  SpO2: 93%   Body mass index is 31.5 kg/(m^2).  Physical Exam  Constitutional: He appears well-developed and well-nourished. No distress.  Cardiovascular: Regular rhythm and intact distal pulses.  Exam reveals no gallop and no friction rub.   Murmur (1/6 SEM) heard. +1 pitting LE edema. Radial and ulnar pulses palpable on left.   Pulmonary/Chest: Effort normal and breath sounds normal. No respiratory distress. He has no wheezes. He has no rales.  Musculoskeletal: He exhibits edema.  Gait unsteady and shuffling. Uses walker to ambulate  Neurological: He is alert.  Skin:  Distal lateral Left forearm cyst resolving with hyperpigmentation noted. Min serous drainage. No redness. Eschar forming. No increased warmth to touch. NT.     Labs reviewed: Office Visit on 07/06/2014  Component Date Value Ref Range Status  . WBC 07/06/2014 6.9  3.4 - 10.8 x10E3/uL Final  . RBC 07/06/2014 3.44* 4.14 - 5.80 x10E6/uL Final  . Hemoglobin 07/06/2014 10.4* 12.6 - 17.7 g/dL Final  . Hematocrit 07/06/2014 31.8* 37.5 - 51.0 % Final  . MCV 07/06/2014 92  79 - 97 fL Final  . MCH 07/06/2014 30.2  26.6 - 33.0 pg Final  . MCHC 07/06/2014 32.7  31.5 - 35.7 g/dL Final  . RDW 07/06/2014 14.7  12.3 - 15.4 % Final  . Platelets 07/06/2014 170  150 - 379 x10E3/uL Final  . Neutrophils 07/06/2014 77   Final  . Lymphs 07/06/2014 14   Final  . Monocytes 07/06/2014 8   Final  . Eos 07/06/2014 1   Final  . Basos 07/06/2014 0   Final  . Neutrophils Absolute 07/06/2014 5.3  1.4 - 7.0 x10E3/uL Final  . Lymphocytes Absolute 07/06/2014 1.0  0.7 - 3.1 x10E3/uL Final  . Monocytes Absolute 07/06/2014 0.6  0.1  - 0.9 x10E3/uL Final  . EOS (ABSOLUTE) 07/06/2014 0.1  0.0 - 0.4 x10E3/uL Final  . Basophils Absolute 07/06/2014 0.0  0.0 - 0.2 x10E3/uL Final  . Immature Granulocytes 07/06/2014 0   Final  . Immature Grans (Abs) 07/06/2014 0.0  0.0 - 0.1 x10E3/uL Final  . Glucose 07/06/2014 147* 65 - 99 mg/dL Final  . BUN 07/06/2014 45* 8 - 27 mg/dL Final  . Creatinine, Ser 07/06/2014 1.80* 0.76 - 1.27 mg/dL Final  . GFR calc non Af Amer 07/06/2014 33* >59 mL/min/1.73 Final  . GFR calc Af Amer 07/06/2014 39* >59 mL/min/1.73 Final  . BUN/Creatinine Ratio 07/06/2014 25* 10 - 22 Final  . Sodium 07/06/2014 142  134 - 144 mmol/L Final  . Potassium 07/06/2014 4.2  3.5 - 5.2 mmol/L Final  . Chloride 07/06/2014 99  97 - 108 mmol/L Final  . CO2 07/06/2014 25  18 - 29 mmol/L Final  . Calcium 07/06/2014 8.9  8.6 - 10.2 mg/dL Final  . Total Protein 07/06/2014 7.3  6.0 - 8.5 g/dL Final  . Albumin 07/06/2014 4.4  3.5 - 4.7 g/dL Final  . Globulin, Total 07/06/2014 2.9  1.5 - 4.5 g/dL Final  . Albumin/Globulin Ratio 07/06/2014 1.5  1.1 - 2.5 Final  . Bilirubin Total 07/06/2014 0.4  0.0 - 1.2 mg/dL Final  . Alkaline Phosphatase 07/06/2014 77  39 - 117 IU/L Final  . AST 07/06/2014 20  0 - 40 IU/L Final  . ALT 07/06/2014 12  0 - 44 IU/L Final  . TSH 07/06/2014 2.740  0.450 - 4.500 uIU/mL Final  . Cholesterol, Total 07/06/2014 189  100 - 199 mg/dL Final  . Triglycerides 07/06/2014 105  0 - 149 mg/dL Final  . HDL 07/06/2014 40  >39 mg/dL Final   Comment: According to ATP-III Guidelines, HDL-C >59 mg/dL is considered a negative risk factor for CHD.   Marland Kitchen VLDL Cholesterol Cal 07/06/2014 21  5 - 40 mg/dL Final  . LDL Calculated 07/06/2014 128* 0 - 99 mg/dL Final  . Chol/HDL Ratio 07/06/2014 4.7  0.0 - 5.0 ratio units Final   Comment:                                   T. Chol/HDL Ratio                                             Men  Women                               1/2 Avg.Risk  3.4    3.3                                    Avg.Risk  5.0    4.4                                2X Avg.Risk  9.6    7.1                                3X Avg.Risk 23.4   11.0     No results found.   Assessment/Plan   ICD-9-CM ICD-10-CM   1. Infected cyst of skin 706.2 L72.9    resolving  2. Essential hypertension - stable 401.9 I10    --Keep left forearm uncovered  to allow for complete healing  --May apply neosporin daily to left forearm area until scabs over then stop  --cont current meds as ordered  --Follow up as scheduled in November   Vaughn Beaumier S. Perlie Gold  [facility] [address] [place], [place] [address] [phone] Cell (Monday-Friday 8 AM - 5 PM) [phone] After 5 PM and follow prompts

## 2014-09-21 NOTE — Progress Notes (Addendum)
ANTIBIOTIC CONSULT NOTE - INITIAL  Pharmacy Consult for vancomycin/zosyn Indication: Sepsis  No Known Allergies  Patient Measurements: Height: 5\' 10"  (177.8 cm) Weight: 216 lb (97.977 kg) IBW/kg (Calculated) : 73  Vital Signs: Temp: 97.6 F (36.4 C) (07/27 2114) Temp Source: Oral (07/27 2114) BP: 105/74 mmHg (07/27 2125) Pulse Rate: 69 (07/27 2114) Intake/Output from previous day:   Intake/Output from this shift:    Labs:  Recent Labs  09/21/14 2159  CREATININE 2.90*   Estimated Creatinine Clearance: 21.5 mL/min (by C-G formula based on Cr of 2.9). No results for input(s): VANCOTROUGH, VANCOPEAK, VANCORANDOM, GENTTROUGH, GENTPEAK, GENTRANDOM, TOBRATROUGH, TOBRAPEAK, TOBRARND, AMIKACINPEAK, AMIKACINTROU, AMIKACIN in the last 72 hours.   Microbiology: No results found for this or any previous visit (from the past 720 hour(s)).  Medical History: Past Medical History  Diagnosis Date  . Diverticulosis     divertic bleed presmed in  2010  . Gout   . Leg swelling     left  . Hyperlipidemia   . History of myocardial infarction   . Olecranon bursitis   . Renal failure   . HTN (hypertension)   . Anemia   . Healthcare maintenance   . Prostate cancer   . AAA (abdominal aortic aneurysm)     Assessment: 81 yo male found unresponsive. Pharmacy has been consulted to dose vancomycin and zosyn for possible sepsis. SCr noted as 2.9 (last SCr was 1.8 06/2014) and CrCl ~ 20.  -Vancomycin 1000mg  and zosyn 3.375gm have been ordered in the ED  7/27 vanc>> 7/27 zosyn>>  7/27 blood x2 7/27 urine  Goal of Therapy:  Vancomycin trough level 15-20 mcg/ml  Plan:  -Zosyn 2.25gm IV q6h -Vancomycin 750mg  IV x1 (to complete a 1750mg  IV load) followed by 1000mg  IV q48hr -Will follow renal function, cultures and clinical progress  Hildred Laser, Pharm D 09/21/2014 10:28 PM

## 2014-09-21 NOTE — ED Notes (Signed)
Lab called and states CBC is clotted.  This RN will attempt with second IV

## 2014-09-21 NOTE — ED Provider Notes (Signed)
CSN: [id number]     Arrival date & time 09/21/14  2111 History   First MD Initiated Contact with Patient 09/21/14 2114     Chief Complaint  Patient presents with  . Loss of Consciousness     (Consider location/radiation/quality/duration/timing/severity/associated sxs/prior Treatment) HPI Comments: Patient is an 81 yo M PMHx significant for AAA (R IA aneurysmectomy 2000), HLD, HTN, Renal Failure, Anemia presenting to the ED after being found unresponsive at the [facility]. Patient had vomited all over himself and was not responsive and breathing 30+ times per minute per EMS. Patient has no complaints at this time and does not remember the events leading up to the LOC. No pain.    Past Medical History  Diagnosis Date  . Diverticulosis     divertic bleed presmed in  2010  . Gout   . Leg swelling     left  . Hyperlipidemia   . History of myocardial infarction   . Olecranon bursitis   . Renal failure   . HTN (hypertension)   . Anemia   . Healthcare maintenance   . Prostate cancer   . AAA (abdominal aortic aneurysm)    Past Surgical History  Procedure Laterality Date  . R ia aneurysmectomy  2000    scar is in RLQ  . Prostate cryoablation  2006  . Suprapubic catheter placement  2006  . Colonoscopy  2006   Family History  Problem Relation Age of Onset  . Hypertension Brother   . Pancreatic cancer Brother   . Stroke Father   . Stroke Mother   . CAD Mother   . Diabetes Brother   . Breast cancer Sister   . Heart disease Sister   . High blood pressure Sister   . Glaucoma Sister   . High blood pressure Sister   . High Cholesterol Sister   . High blood pressure Sister   . High Cholesterol Sister   . GER disease Sister    History  Substance Use Topics  . Smoking status: Never Smoker   . Smokeless tobacco: Never Used  . Alcohol Use: No    Review of Systems  Neurological: Positive for syncope.  All other systems reviewed and are negative.     Allergies  Review  of patient's allergies indicates no known allergies.  Home Medications   Prior to Admission medications   Medication Sig Start Date End Date Taking? Authorizing Provider  acetaminophen (TYLENOL) 325 MG tablet Take 650 mg by mouth 2 (two) times daily. For pain   Yes Historical Provider, MD  allopurinol (ZYLOPRIM) 300 MG tablet Take 300 mg by mouth daily.   Yes Historical Provider, MD  atenolol (TENORMIN) 50 MG tablet One twice daily Patient taking differently: Take 50 mg by mouth 2 (two) times daily.  12/18/12  Yes Tanda Rockers, MD  docusate sodium 100 MG CAPS Take 100 mg by mouth 2 (two) times daily. 03/19/12  Yes Samuella Cota, MD  ENSURE (ENSURE) Take 237 mLs by mouth daily.   Yes Historical Provider, MD  feeding supplement (PRO-STAT SUGAR FREE 64) LIQD Take 30 mLs by mouth daily.   Yes Historical Provider, MD  furosemide (LASIX) 40 MG tablet Two daily Patient taking differently: Take 40-80 mg by mouth 2 (two) times daily. Take 2 tablet by mouth every morning, 1 tablet by mouth  in the afternoon 07/13/12  Yes Tanda Rockers, MD  hydrALAZINE (APRESOLINE) 25 MG tablet Take 25 mg by mouth 3 (three) times  daily.   Yes Historical Provider, MD  isosorbide dinitrate (ISORDIL) 20 MG tablet Take 20 mg by mouth 3 (three) times daily.   Yes Historical Provider, MD  minoxidil (LONITEN) 10 MG tablet One twice daily Patient taking differently: Take 10 mg by mouth 2 (two) times daily.  07/13/12  Yes Tanda Rockers, MD  Multiple Vitamins-Minerals (CERTAVITE SENIOR/ANTIOXIDANT) TABS Take 1 tablet by mouth daily.   Yes Historical Provider, MD  polyethylene glycol (MIRALAX / GLYCOLAX) packet Take 17 g by mouth daily.    Yes Historical Provider, MD  Skin Protectants, Misc. (EUCERIN) cream Apply 1 application topically daily.    Yes Historical Provider, MD  doxycycline (VIBRA-TABS) 100 MG tablet Take 100 mg by mouth 2 (two) times daily.    Historical Provider, MD   BP 155/92 mmHg  Pulse 85  Temp(Src) 98.2  F (36.8 C) (Oral)  Resp 24  Ht 5\' 10"  (1.778 m)  Wt 218 lb 1.6 oz (98.93 kg)  BMI 31.29 kg/m2  SpO2 96% Physical Exam  Constitutional: He is oriented to person, place, and time. He appears well-developed and well-nourished. No distress. Nasal cannula in place.  HENT:  Head: Normocephalic and atraumatic.  Right Ear: External ear normal.  Left Ear: External ear normal.  Nose: Nose normal.  Mouth/Throat: Oropharynx is clear and moist. No oropharyngeal exudate.  Eyes: Conjunctivae and EOM are normal. Pupils are equal, round, and reactive to light.  Neck: Normal range of motion. Neck supple.  Cardiovascular: Normal rate, regular rhythm, normal heart sounds and intact distal pulses.   Pulmonary/Chest: Effort normal. No respiratory distress. He has rhonchi.  Abdominal: Soft. There is no tenderness.  Neurological: He is alert and oriented to person, place, and time. He has normal strength. No cranial nerve deficit. Gait normal. GCS eye subscore is 4. GCS verbal subscore is 5. GCS motor subscore is 6.  Sensation grossly intact.  No pronator drift.  Bilateral heel-knee-shin intact.  Skin: Skin is warm and dry. He is not diaphoretic.  Nursing note and vitals reviewed.   ED Course  Procedures (including critical care time) Medications  sodium chloride 0.9 % bolus 1,000 mL (0 mLs Intravenous Stopped 09/22/14 0150)  hydrALAZINE (APRESOLINE) tablet 25 mg (not administered)  isosorbide dinitrate (ISORDIL) tablet 20 mg (not administered)  atenolol (TENORMIN) tablet 50 mg (50 mg Oral Given 09/22/14 0324)  polyethylene glycol (MIRALAX / GLYCOLAX) packet 17 g (not administered)  minoxidil (LONITEN) tablet 10 mg (10 mg Oral Given 09/22/14 0324)  feeding supplement (PRO-STAT SUGAR FREE 64) liquid 30 mL (not administered)  allopurinol (ZYLOPRIM) tablet 300 mg (not administered)  docusate sodium (COLACE) capsule 100 mg (100 mg Oral Given 09/22/14 0324)  acetaminophen (TYLENOL) tablet 650 mg (not  administered)    Or  acetaminophen (TYLENOL) suppository 650 mg (not administered)  ondansetron (ZOFRAN) tablet 4 mg (not administered)    Or  ondansetron (ZOFRAN) injection 4 mg (not administered)  0.9 %  sodium chloride infusion ( Intravenous New Bag/Given 09/22/14 0324)  morphine 2 MG/ML injection 1 mg (not administered)  sodium chloride 0.9 % bolus 1,000 mL (0 mLs Intravenous Stopped 09/21/14 2224)  piperacillin-tazobactam (ZOSYN) IVPB 3.375 g (0 g Intravenous Stopped 09/21/14 2324)  vancomycin (VANCOCIN) IVPB 1000 mg/200 mL premix (0 mg Intravenous Stopped 09/22/14 0151)    Labs Review Labs Reviewed  COMPREHENSIVE METABOLIC PANEL - Abnormal; Notable for the following:    Glucose, Bld 138 (*)    BUN 58 (*)    Creatinine, Ser 3.01 (*)  GFR calc non Af Amer 17 (*)    GFR calc Af Amer 20 (*)    All other components within normal limits  TROPONIN I - Abnormal; Notable for the following:    Troponin I 0.06 (*)    All other components within normal limits  I-STAT CREATININE, ED - Abnormal; Notable for the following:    Creatinine, Ser 2.90 (*)    All other components within normal limits  I-STAT CG4 LACTIC ACID, ED - Abnormal; Notable for the following:    Lactic Acid, Venous 5.29 (*)    All other components within normal limits  CULTURE, BLOOD (ROUTINE X 2)  CULTURE, BLOOD (ROUTINE X 2)  LIPASE, BLOOD  CBC WITH DIFFERENTIAL/PLATELET  TYPE AND SCREEN    Imaging Review Ct Abdomen Pelvis Wo Contrast  09/22/2014   CLINICAL DATA:  Found unresponsive. Known abdominal aortic aneurysm. Tachypnea.  EXAM: CT ABDOMEN AND PELVIS WITHOUT CONTRAST  TECHNIQUE: Multidetector CT imaging of the abdomen and pelvis was performed following the standard protocol without IV contrast.  COMPARISON:  01/18/2013  FINDINGS: There is acute hemorrhage from the left internal iliac artery aneurysm, with hemorrhage dissecting along the extraperitoneal spaces of the pelvis, displacing the urinary bladder  anteriorly and superiorly. Hemorrhage dissects down the left inguinal canal, around the inguinal hernia sac. The hemorrhage also surrounds the left external iliac artery and vein. A very small volume of hemorrhage tracks up the anterior surface of the left psoas muscle but nearly all of the hemorrhage is contained within the extraperitoneal spaces of the pelvis. The left internal iliac artery aneurysm now measures 8.2 cm and previously measured 6.2 cm on 01/18/2013. The abdominal aortic aneurysm now measures 10.1 cm AP x 9.4 cm transverse and previously measured 7.3 x 6.8 cm.  There is bilateral hydronephrosis and hydroureter. The ureters appear to be extending directly into the area of hemorrhage and may be compromise by the hemorrhage and the marked urinary bladder displacement.  No other acute findings are evident in the abdomen or pelvis. There is a subtle 4 cm mass at the anterior aspect of the spleen which is new or enlarged.  There is unchanged benign appearing hypodensity in the inferior edge of the right hepatic lobe and an unchanged benign appearing cyst in the lateral edge of the left hepatic lobe. There are unremarkable appearances of the pancreas and adrenals. There is colonic diverticulosis.  IMPRESSION: 1. Acute hemorrhage from the left internal iliac artery aneurysm. The hemorrhage dissects along the extraperitoneal spaces of the pelvis with marked displacement of the urinary bladder. The hemorrhage dissects into the left inguinal canal around the hernia sac and also surrounds the left external iliac vasculature. Only a small amount of hemorrhage extends up out of the pelvis, along the anterior surface of the psoas muscle. 2. Marked bilateral hydronephrosis and hydroureter, probably due to compromise of the ureters from the hemorrhage displacing the urinary bladder. 3. Enlargement of the aneurysms since 01/18/2013. The left internal iliac artery aneurysm now measures 8.2 cm. The abdominal aortic  aneurysm measures 10.1 cm AP. 4. Probable 4 cm mass at the anterior aspect of the spleen, poorly seen and not characterized in the absence of intravenous contrast. 5. These results were called by telephone at the time of interpretation on 09/22/2014 at 12:01 am to Dr. Baron Sane , who verbally acknowledged these results.   Electronically Signed   By: Andreas Newport M.D.   On: 09/22/2014 00:01   Ct Chest Wo Contrast  09/22/2014   CLINICAL DATA:  Found unresponsive. Known abdominal aortic aneurysm. Tachypnea.  EXAM: CT ABDOMEN AND PELVIS WITHOUT CONTRAST  TECHNIQUE: Multidetector CT imaging of the abdomen and pelvis was performed following the standard protocol without IV contrast.  COMPARISON:  01/18/2013  FINDINGS: There is acute hemorrhage from the left internal iliac artery aneurysm, with hemorrhage dissecting along the extraperitoneal spaces of the pelvis, displacing the urinary bladder anteriorly and superiorly. Hemorrhage dissects down the left inguinal canal, around the inguinal hernia sac. The hemorrhage also surrounds the left external iliac artery and vein. A very small volume of hemorrhage tracks up the anterior surface of the left psoas muscle but nearly all of the hemorrhage is contained within the extraperitoneal spaces of the pelvis. The left internal iliac artery aneurysm now measures 8.2 cm and previously measured 6.2 cm on 01/18/2013. The abdominal aortic aneurysm now measures 10.1 cm AP x 9.4 cm transverse and previously measured 7.3 x 6.8 cm.  There is bilateral hydronephrosis and hydroureter. The ureters appear to be extending directly into the area of hemorrhage and may be compromise by the hemorrhage and the marked urinary bladder displacement.  No other acute findings are evident in the abdomen or pelvis. There is a subtle 4 cm mass at the anterior aspect of the spleen which is new or enlarged.  There is unchanged benign appearing hypodensity in the inferior edge of the right  hepatic lobe and an unchanged benign appearing cyst in the lateral edge of the left hepatic lobe. There are unremarkable appearances of the pancreas and adrenals. There is colonic diverticulosis.  IMPRESSION: 1. Acute hemorrhage from the left internal iliac artery aneurysm. The hemorrhage dissects along the extraperitoneal spaces of the pelvis with marked displacement of the urinary bladder. The hemorrhage dissects into the left inguinal canal around the hernia sac and also surrounds the left external iliac vasculature. Only a small amount of hemorrhage extends up out of the pelvis, along the anterior surface of the psoas muscle. 2. Marked bilateral hydronephrosis and hydroureter, probably due to compromise of the ureters from the hemorrhage displacing the urinary bladder. 3. Enlargement of the aneurysms since 01/18/2013. The left internal iliac artery aneurysm now measures 8.2 cm. The abdominal aortic aneurysm measures 10.1 cm AP. 4. Probable 4 cm mass at the anterior aspect of the spleen, poorly seen and not characterized in the absence of intravenous contrast. 5. These results were called by telephone at the time of interpretation on 09/22/2014 at 12:01 am to Dr. Baron Sane , who verbally acknowledged these results.   Electronically Signed   By: Andreas Newport M.D.   On: 09/22/2014 00:01   Dg Chest Port 1 View  09/21/2014   CLINICAL DATA:  Loss of consciousness.  EXAM: PORTABLE CHEST - 1 VIEW  COMPARISON:  07/13/2012  FINDINGS: The heart size is enlarged. There is a tortuous aorta. Aortic atherosclerosis noted. No pleural effusion or edema. There is no airspace consolidation identified.  IMPRESSION: 1. No acute findings. 2. Cardiac enlargement. 3. Aortic atherosclerosis.   Electronically Signed   By: Kerby Moors M.D.   On: 09/21/2014 22:29     EKG Interpretation   Date/Time:  Wednesday September 21 2014 21:14:27 EDT Ventricular Rate:  70 PR Interval:  50 QRS Duration: 106 QT Interval:   446 QTC Calculation: 481 R Axis:   -35 Text Interpretation:  Sinus rhythm Short PR interval Probable left atrial  enlargement Left axis deviation Abnormal T, consider ischemia, lateral  leads ST elevation,  consider inferior injury Artifact in lead(s) I III aVR  aVL V1 V2 V3 similar to prior EKG Confirmed by BELFI  MD, MELANIE ([id number])  on 09/21/2014 10:08:34 PM       Troponin minimally elevated above normal limit, no CP.   Antibiotics discontinued as syncopal episode likely secondary to aneurysm rupture.  MDM   Final diagnoses:  Syncope and collapse  Ruptured aneurysm of internal iliac artery    Filed Vitals:   09/22/14 0221  BP: 155/92  Pulse: 85  Temp: 98.2 F (36.8 C)  Resp: 24   I have reviewed nursing notes, vital signs, and all lab and all imaging results as noted above.  Patient presenting to the ED after found unresponsive at [facility]. On arrival on 4L [place], but AAOx3 without neurofocal deficits. Abdomen soft, non-tender, non-distended. No acute findings.  Hypotension resolved after IVF.  Lactic acid elevated will give broad spectrum Abx for coverage.  CT angio chest, abdomen and pelvis obtained given history of AAA and syncopal event this evening.  CT scans reviewed.  Patient with worsening AAA and ruptured left internal iliac artery aneurysm. Continues to deny any chest or abdominal pain. CT scan results discussed with patient and brother. Patient understands the severity of rupture and his poor prognosis. He continues to desire to be a DNR/DNI. Case discussed with Dr. Kellie Simmering. Patient is not a surgical candidate and patient does not want any emergent intervention performed. Patient would like to be admitted to the hospital for observation. Will admit patient to Hospitalist.   Patient d/w with Dr. Tamera Punt, agrees with plan.    Baron Sane, PA-C 09/22/14 [place], PA-C 09/22/14 [id number]  Malvin Johns, MD 09/22/14 [phone]

## 2014-09-21 NOTE — ED Notes (Signed)
Called pharmacist, patient is to receive total dose of vancomycin 1750mg . Also reported zosyn has finished, and requested reshedule of 0000 dose. Pharmacist acknowledges.

## 2014-09-21 NOTE — Patient Instructions (Signed)
Keep left forearm uncovered  May apply neosporin daily to left forearm area until scabs over then stop  Follow up as scheduled in November

## 2014-09-21 NOTE — ED Notes (Signed)
Per EMS:  Pt found unresponse by staff at the [facility]  Pt had vomited all over himself and was not responsive and breathing 30+ times per minute.  Fire arrived and attempted to sternal rub the pt without response.  More alert with EMS.  Pt given 200CC of NaCl and placed on 4L [place].  Pt responsive in the room at this time, but not completely oriented.  EMS sts unsure if hx of dementia, but hx of renal failure known.  Paperwork from facility at bedside.

## 2014-09-21 NOTE — ED Notes (Signed)
PA at bedside.

## 2014-09-22 ENCOUNTER — Encounter (HOSPITAL_COMMUNITY): Payer: Self-pay | Admitting: Internal Medicine

## 2014-09-22 DIAGNOSIS — M109 Gout, unspecified: Secondary | ICD-10-CM | POA: Diagnosis present

## 2014-09-22 DIAGNOSIS — I723 Aneurysm of iliac artery: Secondary | ICD-10-CM

## 2014-09-22 DIAGNOSIS — I7772 Dissection of iliac artery: Secondary | ICD-10-CM | POA: Diagnosis present

## 2014-09-22 DIAGNOSIS — I714 Abdominal aortic aneurysm, without rupture, unspecified: Secondary | ICD-10-CM

## 2014-09-22 DIAGNOSIS — Z515 Encounter for palliative care: Secondary | ICD-10-CM | POA: Diagnosis not present

## 2014-09-22 DIAGNOSIS — I729 Aneurysm of unspecified site: Secondary | ICD-10-CM | POA: Diagnosis not present

## 2014-09-22 DIAGNOSIS — I959 Hypotension, unspecified: Secondary | ICD-10-CM | POA: Diagnosis present

## 2014-09-22 DIAGNOSIS — I252 Old myocardial infarction: Secondary | ICD-10-CM | POA: Diagnosis not present

## 2014-09-22 DIAGNOSIS — Z79899 Other long term (current) drug therapy: Secondary | ICD-10-CM | POA: Diagnosis not present

## 2014-09-22 DIAGNOSIS — N189 Chronic kidney disease, unspecified: Secondary | ICD-10-CM

## 2014-09-22 DIAGNOSIS — R55 Syncope and collapse: Secondary | ICD-10-CM | POA: Diagnosis present

## 2014-09-22 DIAGNOSIS — I129 Hypertensive chronic kidney disease with stage 1 through stage 4 chronic kidney disease, or unspecified chronic kidney disease: Secondary | ICD-10-CM | POA: Diagnosis present

## 2014-09-22 DIAGNOSIS — Z66 Do not resuscitate: Secondary | ICD-10-CM | POA: Diagnosis present

## 2014-09-22 DIAGNOSIS — I10 Essential (primary) hypertension: Secondary | ICD-10-CM

## 2014-09-22 DIAGNOSIS — Z791 Long term (current) use of non-steroidal anti-inflammatories (NSAID): Secondary | ICD-10-CM | POA: Diagnosis not present

## 2014-09-22 DIAGNOSIS — E785 Hyperlipidemia, unspecified: Secondary | ICD-10-CM | POA: Diagnosis present

## 2014-09-22 DIAGNOSIS — Z8546 Personal history of malignant neoplasm of prostate: Secondary | ICD-10-CM | POA: Diagnosis not present

## 2014-09-22 DIAGNOSIS — N183 Chronic kidney disease, stage 3 (moderate): Secondary | ICD-10-CM | POA: Diagnosis present

## 2014-09-22 MED ORDER — HYDRALAZINE HCL 25 MG PO TABS
25.0000 mg | ORAL_TABLET | Freq: Three times a day (TID) | ORAL | Status: DC
  Administered 2014-09-22 (×2): 25 mg via ORAL
  Filled 2014-09-22 (×2): qty 1

## 2014-09-22 MED ORDER — ATENOLOL 50 MG PO TABS
50.0000 mg | ORAL_TABLET | Freq: Two times a day (BID) | ORAL | Status: DC
  Administered 2014-09-22 (×2): 50 mg via ORAL
  Filled 2014-09-22 (×2): qty 1

## 2014-09-22 MED ORDER — ONDANSETRON HCL 4 MG PO TABS: 4.0000 mg | ORAL_TABLET | Freq: Four times a day (QID) | ORAL | Status: DC | PRN

## 2014-09-22 MED ORDER — DOCUSATE SODIUM 100 MG PO CAPS
100.0000 mg | ORAL_CAPSULE | Freq: Two times a day (BID) | ORAL | Status: DC
  Administered 2014-09-22 (×2): 100 mg via ORAL
  Filled 2014-09-22 (×2): qty 1

## 2014-09-22 MED ORDER — OXYCODONE-ACETAMINOPHEN 5-325 MG PO TABS: 1.0000 | ORAL_TABLET | ORAL | Status: AC | PRN

## 2014-09-22 MED ORDER — MINOXIDIL 10 MG PO TABS
10.0000 mg | ORAL_TABLET | Freq: Two times a day (BID) | ORAL | Status: DC
  Administered 2014-09-22 (×2): 10 mg via ORAL
  Filled 2014-09-22 (×3): qty 1

## 2014-09-22 MED ORDER — PRO-STAT SUGAR FREE PO LIQD
30.0000 mL | Freq: Every day | ORAL | Status: DC
  Administered 2014-09-22: 30 mL via ORAL
  Filled 2014-09-22: qty 30

## 2014-09-22 MED ORDER — ISOSORBIDE DINITRATE 10 MG PO TABS
20.0000 mg | ORAL_TABLET | Freq: Three times a day (TID) | ORAL | Status: DC
  Administered 2014-09-22: 20 mg via ORAL
  Filled 2014-09-22 (×2): qty 2

## 2014-09-22 MED ORDER — SODIUM CHLORIDE 0.9 % IV SOLN
INTRAVENOUS | Status: DC
  Administered 2014-09-22: 03:00:00 via INTRAVENOUS

## 2014-09-22 MED ORDER — MORPHINE SULFATE 2 MG/ML IJ SOLN: 1.0000 mg | INTRAMUSCULAR | Status: DC | PRN

## 2014-09-22 MED ORDER — ACETAMINOPHEN 325 MG PO TABS: 650.0000 mg | ORAL_TABLET | Freq: Four times a day (QID) | ORAL | Status: DC | PRN

## 2014-09-22 MED ORDER — POLYETHYLENE GLYCOL 3350 17 G PO PACK
17.0000 g | PACK | Freq: Every day | ORAL | Status: DC
  Administered 2014-09-22: 17 g via ORAL
  Filled 2014-09-22: qty 1

## 2014-09-22 MED ORDER — ONDANSETRON HCL 4 MG/2ML IJ SOLN: 4.0000 mg | Freq: Four times a day (QID) | INTRAMUSCULAR | Status: DC | PRN

## 2014-09-22 MED ORDER — ALLOPURINOL 300 MG PO TABS
300.0000 mg | ORAL_TABLET | Freq: Every day | ORAL | Status: DC
  Administered 2014-09-22: 300 mg via ORAL
  Filled 2014-09-22: qty 1

## 2014-09-22 MED ORDER — DOXYCYCLINE HYCLATE 100 MG PO TABS: 100.0000 mg | ORAL_TABLET | Freq: Two times a day (BID) | ORAL | Status: DC

## 2014-09-22 MED ORDER — ACETAMINOPHEN 650 MG RE SUPP: 650.0000 mg | Freq: Four times a day (QID) | RECTAL | Status: DC | PRN

## 2014-09-22 NOTE — Clinical Social Work Note (Signed)
Clinical Social Work Assessment  Patient Details  Name: [name] MRN: [id number] Date of Birth: 05-19-1926  Date of referral:  09/21/14               Reason for consult:  Facility Placement                Permission sought to share information with:  Facility Sport and exercise psychologist (Patient's brother, Elmon Shader at the bedside) Permission granted to share information::  Yes, Verbal Permission Granted  Name::        Agency::  [facility]  Relationship::     Contact Information:  [phone]  Housing/Transportation Living arrangements for the past 2 months:  [place] of Information:  Patient Patient Interpreter Needed:  None Criminal Activity/Legal Involvement Pertinent to Current Situation/Hospitalization:  Yes Significant Relationships:  Siblings (Brother Dylann Gallier [phone] (c)) Lives with:  Facility Resident Do you feel safe going back to the place where you live?  Yes Need for family participation in patient care:  Yes (Comment)  Care giving concerns:  Patient now comfort care   Social Worker assessment / plan:  CSW talked with patient and brother, who was at the bedside regarding discharge planning and residential hospice mentioned. Mr. Lawes was in bed, sitting up, alert, oriented and engaged. Patient informed CSW that he wants to return to [facility] as that is his home and he wants to die there. CSW assured patient that contact will be made with facility regarding his return there.  Employment status:  Retired Health visitor, Managed Care PT Recommendations:  Not assessed at this time Information / Referral to community resources:  Other (Comment Required) (Not needed or requested at this time)  Patient/Family's Response to care: Patient/family in agreement with comfort care measures.  Patient/Family's Understanding of and Emotional Response to Diagnosis, Current Treatment, and Prognosis:  Patient and  family involved in the decision for comfort care and patient wants to return to the Assisted Living facility.  Emotional Assessment Appearance:  Appears stated age Attitude/Demeanor/Rapport:  Other (Appropriate) Affect (typically observed):  Appropriate Orientation:  Oriented to Self, Oriented to Place, Oriented to  Time, Oriented to Situation Alcohol / Substance use:  Never Used (Patient report that he has never smoked and does not drink or use drugs.) Psych involvement (Current and /or in the community):  No (Comment)  Discharge Needs  Concerns to be addressed:  Discharge Planning Concerns (Patient returning to [facility] and will have Hospice services.) Readmission within the last 30 days:  No Current discharge risk:  None Barriers to Discharge:  No Barriers Identified   Sable Feil, LCSW 09/22/2014, 2:17 PM

## 2014-09-22 NOTE — ED Notes (Signed)
Plan of care discussed with Delsa Bern, patient is now a DNR. No 4th bolus needed, antibiotics currently infusing, no new ones ordered. Primary RN aware.

## 2014-09-22 NOTE — H&P (Signed)
[facility] History and Physical  [name] RXV:[id number] DOB: March 29, 1926 DOA: 09/21/2014  Referring physician: ER physician. PCP: Gildardo Cranker, DO  Specialists: Dr. Melvyn Novas.  Chief Complaint: Loss of consciousness.  HPI: [name] is a 81 y.o. male and history of abdominal aortic aneurysm, hypertension, chronic kidney disease was brought to the ER from nursing home after patient was found to be unresponsive. In the ER initially patient was found to be hypotensive and fluid bolus was given and CT abdomen and pelvis shows large abdominal artery aneurysm with left internal iliac artery aneurysm rupture. Dr. Kellie Simmering on call vascular surgeon was consulted and at this time since patient will not be a candidate for surgery patient has been admitted for comfort measures. This may be a terminal event which patient and patient's brother who is at the bedside is aware.   Review of Systems: As presented in the history of presenting illness, rest negative.  Past Medical History  Diagnosis Date  . Diverticulosis     divertic bleed presmed in  2010  . Gout   . Leg swelling     left  . Hyperlipidemia   . History of myocardial infarction   . Olecranon bursitis   . Renal failure   . HTN (hypertension)   . Anemia   . Healthcare maintenance   . Prostate cancer   . AAA (abdominal aortic aneurysm)    Past Surgical History  Procedure Laterality Date  . R ia aneurysmectomy  2000    scar is in RLQ  . Prostate cryoablation  2006  . Suprapubic catheter placement  2006  . Colonoscopy  2006   Social History:  reports that he has never smoked. He has never used smokeless tobacco. He reports that he does not drink alcohol. His drug history is not on file. Where does patient live nursing home. Can patient participate in ADLs? No.  No Known Allergies  Family History:  Family History  Problem Relation Age of Onset  . Hypertension Brother   . Pancreatic cancer Brother   . Stroke  Father   . Stroke Mother   . CAD Mother   . Diabetes Brother   . Breast cancer Sister   . Heart disease Sister   . High blood pressure Sister   . Glaucoma Sister   . High blood pressure Sister   . High Cholesterol Sister   . High blood pressure Sister   . High Cholesterol Sister   . GER disease Sister       Prior to Admission medications   Medication Sig Start Date End Date Taking? Authorizing Provider  acetaminophen (TYLENOL) 325 MG tablet Take 650 mg by mouth 2 (two) times daily. For pain   Yes Historical Provider, MD  allopurinol (ZYLOPRIM) 300 MG tablet Take 300 mg by mouth daily.   Yes Historical Provider, MD  atenolol (TENORMIN) 50 MG tablet One twice daily Patient taking differently: Take 50 mg by mouth 2 (two) times daily.  12/18/12  Yes Tanda Rockers, MD  docusate sodium 100 MG CAPS Take 100 mg by mouth 2 (two) times daily. 03/19/12  Yes Samuella Cota, MD  ENSURE (ENSURE) Take 237 mLs by mouth daily.   Yes Historical Provider, MD  feeding supplement (PRO-STAT SUGAR FREE 64) LIQD Take 30 mLs by mouth daily.   Yes Historical Provider, MD  furosemide (LASIX) 40 MG tablet Two daily Patient taking differently: Take 40-80 mg by mouth 2 (two) times daily. Take 2 tablet  by mouth every morning, 1 tablet by mouth  in the afternoon 07/13/12  Yes Tanda Rockers, MD  hydrALAZINE (APRESOLINE) 25 MG tablet Take 25 mg by mouth 3 (three) times daily.   Yes Historical Provider, MD  isosorbide dinitrate (ISORDIL) 20 MG tablet Take 20 mg by mouth 3 (three) times daily.   Yes Historical Provider, MD  minoxidil (LONITEN) 10 MG tablet One twice daily Patient taking differently: Take 10 mg by mouth 2 (two) times daily.  07/13/12  Yes Tanda Rockers, MD  Multiple Vitamins-Minerals (CERTAVITE SENIOR/ANTIOXIDANT) TABS Take 1 tablet by mouth daily.   Yes Historical Provider, MD  polyethylene glycol (MIRALAX / GLYCOLAX) packet Take 17 g by mouth daily.    Yes Historical Provider, MD  Skin Protectants,  Misc. (EUCERIN) cream Apply 1 application topically daily.    Yes Historical Provider, MD  doxycycline (VIBRA-TABS) 100 MG tablet Take 100 mg by mouth 2 (two) times daily.    Historical Provider, MD    Physical Exam: Filed Vitals:   09/22/14 0100 09/22/14 0115 09/22/14 0145 09/22/14 0221  BP: 155/101 168/101 171/142 155/92  Pulse: 84 84 84 85  Temp:    98.2 F (36.8 C)  TempSrc:    Oral  Resp: 30 31 32 24  Height:      Weight:    98.93 kg (218 lb 1.6 oz)  SpO2: 90% 93% 95% 96%     General:  Moderately well-developed nourished.  Eyes: Anicteric. No pallor.  ENT: No discharge from the ears eyes nose and mouth.  Neck: No mass felt.  Cardiovascular: S1 and S2 heard.  Respiratory: No rhonchi or crepitations.  Abdomen: Soft nontender bowel sounds present.  Skin: No rash.  Musculoskeletal: No edema.  Psychiatric: Patient looks confused.  Neurologic: Patient is confused but follows commands.  Labs on Admission:  Basic Metabolic Panel:  Recent Labs Lab 09/21/14 2147 09/21/14 2159  NA 144  --   K 4.3  --   CL 105  --   CO2 24  --   GLUCOSE 138*  --   BUN 58*  --   CREATININE 3.01* 2.90*  CALCIUM 9.1  --    Liver Function Tests:  Recent Labs Lab 09/21/14 2147  AST 40  ALT 17  ALKPHOS 74  BILITOT 0.7  PROT 7.5  ALBUMIN 3.9    Recent Labs Lab 09/21/14 2147  LIPASE 26   No results for input(s): AMMONIA in the last 168 hours. CBC: No results for input(s): WBC, NEUTROABS, HGB, HCT, MCV, PLT in the last 168 hours. Cardiac Enzymes:  Recent Labs Lab 09/21/14 2147  TROPONINI 0.06*    BNP (last 3 results) No results for input(s): BNP in the last 8760 hours.  ProBNP (last 3 results) No results for input(s): PROBNP in the last 8760 hours.  CBG: No results for input(s): GLUCAP in the last 168 hours.  Radiological Exams on Admission: Ct Abdomen Pelvis Wo Contrast  09/22/2014   CLINICAL DATA:  Found unresponsive. Known abdominal aortic aneurysm.  Tachypnea.  EXAM: CT ABDOMEN AND PELVIS WITHOUT CONTRAST  TECHNIQUE: Multidetector CT imaging of the abdomen and pelvis was performed following the standard protocol without IV contrast.  COMPARISON:  01/18/2013  FINDINGS: There is acute hemorrhage from the left internal iliac artery aneurysm, with hemorrhage dissecting along the extraperitoneal spaces of the pelvis, displacing the urinary bladder anteriorly and superiorly. Hemorrhage dissects down the left inguinal canal, around the inguinal hernia sac. The hemorrhage also surrounds the  left external iliac artery and vein. A very small volume of hemorrhage tracks up the anterior surface of the left psoas muscle but nearly all of the hemorrhage is contained within the extraperitoneal spaces of the pelvis. The left internal iliac artery aneurysm now measures 8.2 cm and previously measured 6.2 cm on 01/18/2013. The abdominal aortic aneurysm now measures 10.1 cm AP x 9.4 cm transverse and previously measured 7.3 x 6.8 cm.  There is bilateral hydronephrosis and hydroureter. The ureters appear to be extending directly into the area of hemorrhage and may be compromise by the hemorrhage and the marked urinary bladder displacement.  No other acute findings are evident in the abdomen or pelvis. There is a subtle 4 cm mass at the anterior aspect of the spleen which is new or enlarged.  There is unchanged benign appearing hypodensity in the inferior edge of the right hepatic lobe and an unchanged benign appearing cyst in the lateral edge of the left hepatic lobe. There are unremarkable appearances of the pancreas and adrenals. There is colonic diverticulosis.  IMPRESSION: 1. Acute hemorrhage from the left internal iliac artery aneurysm. The hemorrhage dissects along the extraperitoneal spaces of the pelvis with marked displacement of the urinary bladder. The hemorrhage dissects into the left inguinal canal around the hernia sac and also surrounds the left external iliac  vasculature. Only a small amount of hemorrhage extends up out of the pelvis, along the anterior surface of the psoas muscle. 2. Marked bilateral hydronephrosis and hydroureter, probably due to compromise of the ureters from the hemorrhage displacing the urinary bladder. 3. Enlargement of the aneurysms since 01/18/2013. The left internal iliac artery aneurysm now measures 8.2 cm. The abdominal aortic aneurysm measures 10.1 cm AP. 4. Probable 4 cm mass at the anterior aspect of the spleen, poorly seen and not characterized in the absence of intravenous contrast. 5. These results were called by telephone at the time of interpretation on 09/22/2014 at 12:01 am to Dr. Baron Sane , who verbally acknowledged these results.   Electronically Signed   By: Andreas Newport M.D.   On: 09/22/2014 00:01   Ct Chest Wo Contrast  09/22/2014   CLINICAL DATA:  Found unresponsive. Known abdominal aortic aneurysm. Tachypnea.  EXAM: CT ABDOMEN AND PELVIS WITHOUT CONTRAST  TECHNIQUE: Multidetector CT imaging of the abdomen and pelvis was performed following the standard protocol without IV contrast.  COMPARISON:  01/18/2013  FINDINGS: There is acute hemorrhage from the left internal iliac artery aneurysm, with hemorrhage dissecting along the extraperitoneal spaces of the pelvis, displacing the urinary bladder anteriorly and superiorly. Hemorrhage dissects down the left inguinal canal, around the inguinal hernia sac. The hemorrhage also surrounds the left external iliac artery and vein. A very small volume of hemorrhage tracks up the anterior surface of the left psoas muscle but nearly all of the hemorrhage is contained within the extraperitoneal spaces of the pelvis. The left internal iliac artery aneurysm now measures 8.2 cm and previously measured 6.2 cm on 01/18/2013. The abdominal aortic aneurysm now measures 10.1 cm AP x 9.4 cm transverse and previously measured 7.3 x 6.8 cm.  There is bilateral hydronephrosis and  hydroureter. The ureters appear to be extending directly into the area of hemorrhage and may be compromise by the hemorrhage and the marked urinary bladder displacement.  No other acute findings are evident in the abdomen or pelvis. There is a subtle 4 cm mass at the anterior aspect of the spleen which is new or enlarged.  There  is unchanged benign appearing hypodensity in the inferior edge of the right hepatic lobe and an unchanged benign appearing cyst in the lateral edge of the left hepatic lobe. There are unremarkable appearances of the pancreas and adrenals. There is colonic diverticulosis.  IMPRESSION: 1. Acute hemorrhage from the left internal iliac artery aneurysm. The hemorrhage dissects along the extraperitoneal spaces of the pelvis with marked displacement of the urinary bladder. The hemorrhage dissects into the left inguinal canal around the hernia sac and also surrounds the left external iliac vasculature. Only a small amount of hemorrhage extends up out of the pelvis, along the anterior surface of the psoas muscle. 2. Marked bilateral hydronephrosis and hydroureter, probably due to compromise of the ureters from the hemorrhage displacing the urinary bladder. 3. Enlargement of the aneurysms since 01/18/2013. The left internal iliac artery aneurysm now measures 8.2 cm. The abdominal aortic aneurysm measures 10.1 cm AP. 4. Probable 4 cm mass at the anterior aspect of the spleen, poorly seen and not characterized in the absence of intravenous contrast. 5. These results were called by telephone at the time of interpretation on 09/22/2014 at 12:01 am to Dr. Baron Sane , who verbally acknowledged these results.   Electronically Signed   By: Andreas Newport M.D.   On: 09/22/2014 00:01   Dg Chest Port 1 View  09/21/2014   CLINICAL DATA:  Loss of consciousness.  EXAM: PORTABLE CHEST - 1 VIEW  COMPARISON:  07/13/2012  FINDINGS: The heart size is enlarged. There is a tortuous aorta. Aortic  atherosclerosis noted. No pleural effusion or edema. There is no airspace consolidation identified.  IMPRESSION: 1. No acute findings. 2. Cardiac enlargement. 3. Aortic atherosclerosis.   Electronically Signed   By: Kerby Moors M.D.   On: 09/21/2014 22:29    Assessment/Plan Active Problems:   Essential hypertension   Chronic kidney disease   Ruptured aneurysm of internal iliac artery   AAA (abdominal aortic aneurysm)   Ruptured aneurysm of artery   1. Ruptured left internal iliac artery aneurysm. 2. Hypertension presently on presentation was hypotensive. 3. Chronic kidney disease stage III. 4. Abdominal aortic aneurysm.  Plan - at this time patient and patient's brother has opted for comfort measures and they are well aware this may be a terminal event. Patient is a DO NOT RESUSCITATE as per the patient wishes and hospice has been consulted. I have placed patient on when necessary IV morphine for pain relief.   DVT Prophylaxis SCDs.  Code Status: DO NOT RESUSCITATE.  Family Communication: Discussed patient's brother at the bedside.  Disposition Plan: Admit to inpatient.    [name] N. [facility] Pager [phone].  If 7PM-7AM, please contact night-coverage [email or web address] Password [facility] 09/22/2014, 2:57 AM

## 2014-09-22 NOTE — Discharge Summary (Signed)
Physician Discharge Summary  [name] VHQ:[id number] DOB: [birth year] DOA: 09/21/2014  PCP: Gildardo Cranker, DO  Admit date: 09/21/2014 Discharge date: 09/22/2014  Time spent: 40 minutes  Recommendations for Outpatient Follow-up:  Patient will be discharged to [facility].  Patient should be followed by hospice as well as palliative care.  Patient will need to follow up with primary care provider as needed.  Patient should continue medications as prescribed.  Patient should follow a regular diet.   Discharge Diagnoses:  Active Problems:   Essential hypertension   Chronic kidney disease   Ruptured aneurysm of internal iliac artery   AAA (abdominal aortic aneurysm)   Ruptured aneurysm of artery   Syncope and collapse   Encounter for palliative care   Discharge Condition: Stable  Diet recommendation: Regular  Filed Weights   09/21/14 2114 09/22/14 0221  Weight: 97.977 kg (216 lb) 98.93 kg (218 lb 1.6 oz)    History of present illness:  On 09/22/2014 by Dr. Alvera Novel is a 81 y.o. male and history of abdominal aortic aneurysm, hypertension, chronic kidney disease was brought to the ER from nursing home after patient was found to be unresponsive. In the ER initially patient was found to be hypotensive and fluid bolus was given and CT abdomen and pelvis shows large abdominal artery aneurysm with left internal iliac artery aneurysm rupture. Dr. Kellie Simmering on call vascular surgeon was consulted and at this time since patient will not be a candidate for surgery patient has been admitted for comfort measures. This may be a terminal event which patient and patient's brother who is at the bedside is aware.  Hospital Course:  Patient was admitted earlier this morning and was found to have a ruptured left internal iliac artery aneurysm. Vascular surgery was consulted and patient was not noted to be a surgical candidate. Patient and his brother understood  this, and patient wished to return back to his assisted-living community with hospice. He is now comfort care and DO NOT RESUSCITATE. Continue pain control as needed. Palliative care was consulted during hospitalization. Hospice as well as palliative care should continue [facility].    Patient was also found to be slightly hypotensive upon admission however this has resolved. He does have chronic kidney disease stage III. Patient should continue his medications as prescribed.  Procedures: None  Consultations: Vascular Surgery, Dr. Kellie Simmering  Discharge Exam: Filed Vitals:   09/22/14 1103  BP: 147/89  Pulse: 95  Temp:   Resp:      General: Well developed, well nourished, NAD, appears stated age  HEENT: NCAT,  mucous membranes moist.  Cardiovascular: S1 S2 auscultated, no rubs, murmurs or gallops. Regular rate and rhythm.  Respiratory: Clear to auscultation bilaterally with equal chest rise  Abdomen: Soft, nontender, distended, + bowel sounds  Extremities: warm dry without cyanosis clubbing or edema  Neuro: AAOx3, nonfocal  Psych: Normal affect and demeanor with intact judgement and insight  Discharge Instructions      Discharge Instructions    Discharge instructions    Complete by:  As directed   Patient will be discharged to [facility].  Patient should be followed by hospice as well as palliative care.  Patient will need to follow up with primary care provider as needed.  Patient should continue medications as prescribed.  Patient should follow a regular diet.            Medication List  STOP taking these medications        doxycycline 100 MG tablet  Commonly known as:  VIBRA-TABS      TAKE these medications        acetaminophen 325 MG tablet  Commonly known as:  TYLENOL  Take 650 mg by mouth 2 (two) times daily. For pain     allopurinol 300 MG tablet  Commonly known as:  ZYLOPRIM    Take 300 mg by mouth daily.     atenolol 50 MG tablet  Commonly known as:  TENORMIN  One twice daily     CERTAVITE SENIOR/ANTIOXIDANT Tabs  Take 1 tablet by mouth daily.     DSS 100 MG Caps  Take 100 mg by mouth 2 (two) times daily.     ENSURE  Take 237 mLs by mouth daily.     eucerin cream  Apply 1 application topically daily.     feeding supplement (PRO-STAT SUGAR FREE 64) Liqd  Take 30 mLs by mouth daily.     furosemide 40 MG tablet  Commonly known as:  LASIX  Two daily     hydrALAZINE 25 MG tablet  Commonly known as:  APRESOLINE  Take 25 mg by mouth 3 (three) times daily.     isosorbide dinitrate 20 MG tablet  Commonly known as:  ISORDIL  Take 20 mg by mouth 3 (three) times daily.     minoxidil 10 MG tablet  Commonly known as:  LONITEN  One twice daily     oxyCODONE-acetaminophen 5-325 MG per tablet  Commonly known as:  ROXICET  Take 1 tablet by mouth every 4 (four) hours as needed for moderate pain or severe pain.     polyethylene glycol packet  Commonly known as:  MIRALAX / GLYCOLAX  Take 17 g by mouth daily.       No Known Allergies Follow-up Information    Follow up with Gildardo Cranker, DO.   Specialty:  Internal Medicine   Why:  As needed   Contact information:   [place] [id number] [phone]        The results of significant diagnostics from this hospitalization (including imaging, microbiology, ancillary and laboratory) are listed below for reference.    Significant Diagnostic Studies: Ct Abdomen Pelvis Wo Contrast  09/22/2014   CLINICAL DATA:  Found unresponsive. Known abdominal aortic aneurysm. Tachypnea.  EXAM: CT ABDOMEN AND PELVIS WITHOUT CONTRAST  TECHNIQUE: Multidetector CT imaging of the abdomen and pelvis was performed following the standard protocol without IV contrast.  COMPARISON:  01/18/2013  FINDINGS: There is acute hemorrhage from the left internal iliac artery aneurysm, with hemorrhage dissecting along  the extraperitoneal spaces of the pelvis, displacing the urinary bladder anteriorly and superiorly. Hemorrhage dissects down the left inguinal canal, around the inguinal hernia sac. The hemorrhage also surrounds the left external iliac artery and vein. A very small volume of hemorrhage tracks up the anterior surface of the left psoas muscle but nearly all of the hemorrhage is contained within the extraperitoneal spaces of the pelvis. The left internal iliac artery aneurysm now measures 8.2 cm and previously measured 6.2 cm on 01/18/2013. The abdominal aortic aneurysm now measures 10.1 cm AP x 9.4 cm transverse and previously measured 7.3 x 6.8 cm.  There is bilateral hydronephrosis and hydroureter. The ureters appear to be extending directly into the area of hemorrhage and may be compromise by the hemorrhage and the marked urinary bladder displacement.  No other acute findings are  evident in the abdomen or pelvis. There is a subtle 4 cm mass at the anterior aspect of the spleen which is new or enlarged.  There is unchanged benign appearing hypodensity in the inferior edge of the right hepatic lobe and an unchanged benign appearing cyst in the lateral edge of the left hepatic lobe. There are unremarkable appearances of the pancreas and adrenals. There is colonic diverticulosis.  IMPRESSION: 1. Acute hemorrhage from the left internal iliac artery aneurysm. The hemorrhage dissects along the extraperitoneal spaces of the pelvis with marked displacement of the urinary bladder. The hemorrhage dissects into the left inguinal canal around the hernia sac and also surrounds the left external iliac vasculature. Only a small amount of hemorrhage extends up out of the pelvis, along the anterior surface of the psoas muscle. 2. Marked bilateral hydronephrosis and hydroureter, probably due to compromise of the ureters from the hemorrhage displacing the urinary bladder. 3. Enlargement of the aneurysms since 01/18/2013. The left  internal iliac artery aneurysm now measures 8.2 cm. The abdominal aortic aneurysm measures 10.1 cm AP. 4. Probable 4 cm mass at the anterior aspect of the spleen, poorly seen and not characterized in the absence of intravenous contrast. 5. These results were called by telephone at the time of interpretation on 09/22/2014 at 12:01 am to Dr. Baron Sane , who verbally acknowledged these results.   Electronically Signed   By: Andreas Newport M.D.   On: 09/22/2014 00:01   Ct Chest Wo Contrast  09/22/2014   CLINICAL DATA:  Found unresponsive. Known abdominal aortic aneurysm. Tachypnea.  EXAM: CT ABDOMEN AND PELVIS WITHOUT CONTRAST  TECHNIQUE: Multidetector CT imaging of the abdomen and pelvis was performed following the standard protocol without IV contrast.  COMPARISON:  01/18/2013  FINDINGS: There is acute hemorrhage from the left internal iliac artery aneurysm, with hemorrhage dissecting along the extraperitoneal spaces of the pelvis, displacing the urinary bladder anteriorly and superiorly. Hemorrhage dissects down the left inguinal canal, around the inguinal hernia sac. The hemorrhage also surrounds the left external iliac artery and vein. A very small volume of hemorrhage tracks up the anterior surface of the left psoas muscle but nearly all of the hemorrhage is contained within the extraperitoneal spaces of the pelvis. The left internal iliac artery aneurysm now measures 8.2 cm and previously measured 6.2 cm on 01/18/2013. The abdominal aortic aneurysm now measures 10.1 cm AP x 9.4 cm transverse and previously measured 7.3 x 6.8 cm.  There is bilateral hydronephrosis and hydroureter. The ureters appear to be extending directly into the area of hemorrhage and may be compromise by the hemorrhage and the marked urinary bladder displacement.  No other acute findings are evident in the abdomen or pelvis. There is a subtle 4 cm mass at the anterior aspect of the spleen which is new or enlarged.  There is  unchanged benign appearing hypodensity in the inferior edge of the right hepatic lobe and an unchanged benign appearing cyst in the lateral edge of the left hepatic lobe. There are unremarkable appearances of the pancreas and adrenals. There is colonic diverticulosis.  IMPRESSION: 1. Acute hemorrhage from the left internal iliac artery aneurysm. The hemorrhage dissects along the extraperitoneal spaces of the pelvis with marked displacement of the urinary bladder. The hemorrhage dissects into the left inguinal canal around the hernia sac and also surrounds the left external iliac vasculature. Only a small amount of hemorrhage extends up out of the pelvis, along the anterior surface of the psoas muscle. 2.  Marked bilateral hydronephrosis and hydroureter, probably due to compromise of the ureters from the hemorrhage displacing the urinary bladder. 3. Enlargement of the aneurysms since 01/18/2013. The left internal iliac artery aneurysm now measures 8.2 cm. The abdominal aortic aneurysm measures 10.1 cm AP. 4. Probable 4 cm mass at the anterior aspect of the spleen, poorly seen and not characterized in the absence of intravenous contrast. 5. These results were called by telephone at the time of interpretation on 09/22/2014 at 12:01 am to Dr. Baron Sane , who verbally acknowledged these results.   Electronically Signed   By: Andreas Newport M.D.   On: 09/22/2014 00:01   Dg Chest Port 1 View  09/21/2014   CLINICAL DATA:  Loss of consciousness.  EXAM: PORTABLE CHEST - 1 VIEW  COMPARISON:  07/13/2012  FINDINGS: The heart size is enlarged. There is a tortuous aorta. Aortic atherosclerosis noted. No pleural effusion or edema. There is no airspace consolidation identified.  IMPRESSION: 1. No acute findings. 2. Cardiac enlargement. 3. Aortic atherosclerosis.   Electronically Signed   By: Kerby Moors M.D.   On: 09/21/2014 22:29    Microbiology: Recent Results (from the past 240 hour(s))  Culture, blood  (routine x 2)     Status: None (Preliminary result)   Collection Time: 09/21/14 10:06 PM  Result Value Ref Range Status   Specimen Description BLOOD LEFT WRIST  Final   Special Requests BOTTLES DRAWN AEROBIC ONLY 3 CC  Final   Culture NO GROWTH < 24 HOURS  Final   Report Status PENDING  Incomplete  Culture, blood (routine x 2)     Status: None (Preliminary result)   Collection Time: 09/21/14 10:11 PM  Result Value Ref Range Status   Specimen Description BLOOD LEFT HAND  Final   Special Requests BOTTLES DRAWN AEROBIC ONLY 2 CC  Final   Culture NO GROWTH < 24 HOURS  Final   Report Status PENDING  Incomplete     Labs: Basic Metabolic Panel:  Recent Labs Lab 09/21/14 2147 09/21/14 2159  NA 144  --   K 4.3  --   CL 105  --   CO2 24  --   GLUCOSE 138*  --   BUN 58*  --   CREATININE 3.01* 2.90*  CALCIUM 9.1  --    Liver Function Tests:  Recent Labs Lab 09/21/14 2147  AST 40  ALT 17  ALKPHOS 74  BILITOT 0.7  PROT 7.5  ALBUMIN 3.9    Recent Labs Lab 09/21/14 2147  LIPASE 26   No results for input(s): AMMONIA in the last 168 hours. CBC: No results for input(s): WBC, NEUTROABS, HGB, HCT, MCV, PLT in the last 168 hours. Cardiac Enzymes:  Recent Labs Lab 09/21/14 2147  TROPONINI 0.06*   BNP: BNP (last 3 results) No results for input(s): BNP in the last 8760 hours.  ProBNP (last 3 results) No results for input(s): PROBNP in the last 8760 hours.  CBG: No results for input(s): GLUCAP in the last 168 hours.     SignedCristal Ford  [facility] 09/22/2014, 12:53 PM

## 2014-09-22 NOTE — Consult Note (Signed)
Consultation Note Date: 09/22/2014   Patient Name: [name]  DOB: 12-11-1926  MRN: [id number]  Age / Sex: 81 y.o., male   PCP: Dillon Cranker, DO Referring Physician: Cristal Ford, DO  Reason for Consultation: Establishing goals of care  Palliative Care Assessment and Plan Summary of Established Goals of Care and Medical Treatment Preferences   [name] is a 81 y.o. male and history of abdominal aortic aneurysm, hypertension, chronic kidney disease was brought to the ER from nursing home after patient was found to be unresponsive. In the ER initially patient was found to be hypotensive and fluid bolus was given and CT abdomen and pelvis shows large abdominal artery aneurysm with left internal iliac artery aneurysm rupture. Dr. Kellie Simmering on call vascular surgeon was consulted and at this time since patient will not be a candidate for surgery patient has been admitted for comfort measures. This may be a terminal event which patient and patient's brother who is at the bedside is aware.   A palliative consult has been placed for further discussions and for hospice arrangements. There is a hospice consult pending. The patient is sitting up in bed, he is attempting to feed himself breakfast. He is in no distress, he is awake alert. Does answer few questions appropriately.   Discussed with patient's brother at the bedside, patient's code status is DNR DNI, plans are for the patient to continue comfort measures and for hospice consult. Patient has PRN IV Morphine available, al though, currently, he is without any symptoms.   Contacts/Participants in Discussion: Primary Decision Maker: Brother [name] at [phone]   HCPOA: yes   Brother [name].   Code Status/Advance Care Planning:  DNR DNI  Symptom Management:   Use Morphine IV PRN  Palliative Prophylaxis: yes, patient has Zofran, miralax and colace  Additional Recommendations (Limitations, Scope,  Preferences):  Hospice consult.  Psycho-social/Spiritual:   Support System: brother and several other siblings. Patient has never married, doesn't have children.  Desire for further Chaplaincy support:yes  Prognosis: Hours - Days  Discharge Planning:  Hospice facility   Domains of Care: - Physical:  No abdominal pain currently - Psychological: in no distress - Social: never married, no kids. Has 7 siblings - Spiritual: christian faith, patient has been supported by Clinical biochemist.  - Cultural: as above.  - Imminently dying: no but prognosis is guarded given ruptured L internal iliac artery aneurysm.  - Ethical/Legal: DNR DNI. Brother [name] is Psychiatrist.   Values: comfort care at end of life  Life limiting illness: ruptured iliac artery aneurysm.      Chief Complaint/History of Present Illness: unresponsive  Primary Diagnoses  Present on Admission:  . Ruptured aneurysm of internal iliac artery . Chronic kidney disease . Essential hypertension . Ruptured aneurysm of artery  Palliative Review of Systems: Completed.   I have reviewed the medical record, interviewed the patient and family, and examined the patient. The following aspects are pertinent.  Past Medical History  Diagnosis Date  . Diverticulosis     divertic bleed presmed in  2010  . Gout   . Leg swelling     left  . Hyperlipidemia   . History of myocardial infarction   . Olecranon bursitis   . Renal failure   . HTN (hypertension)   . Anemia   . Healthcare maintenance   . Prostate cancer   . AAA (abdominal aortic aneurysm)    History   Social History  . Marital Status:  Single    Spouse Name: N/A  . Number of Children: N/A  . Years of Education: N/A   Occupational History  . retired    Social History Main Topics  . Smoking status: Never Smoker   . Smokeless tobacco: Never Used  . Alcohol Use: No  . Drug Use: Not on file  . Sexual Activity: Not on file   Other Topics Concern  . 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 [employer] Strategy [employer].   Exercise, yes- strengthen and muscle exercises   Living Will- No    DNR- No    POA/HPOA- Yes          Family History  Problem Relation Age of Onset  . Hypertension Brother   . Pancreatic cancer Brother   . Stroke Father   . Stroke Mother   . CAD Mother   . Diabetes Brother   . Breast cancer Sister   . Heart disease Sister   . High blood pressure Sister   . Glaucoma Sister   . High blood pressure Sister   . High Cholesterol Sister   . High blood pressure Sister   . High Cholesterol Sister   . GER disease Sister    Scheduled Meds: . allopurinol  300 mg Oral Daily  . atenolol  50 mg Oral BID  . docusate sodium  100 mg Oral BID  . feeding supplement (PRO-STAT SUGAR FREE 64)  30 mL Oral Daily  . hydrALAZINE  25 mg Oral 3 times per day  . isosorbide dinitrate  20 mg Oral TID WC  . minoxidil  10 mg Oral BID  . polyethylene glycol  17 g Oral Daily   Continuous Infusions: . sodium chloride 50 mL/hr at 09/22/14 0324   PRN Meds:.acetaminophen **OR** acetaminophen, morphine injection, ondansetron **OR** ondansetron (ZOFRAN) IV Medications Prior to Admission:  Prior to Admission medications   Medication Sig Start Date End Date Taking? Authorizing Provider  acetaminophen (TYLENOL) 325 MG tablet Take 650 mg by mouth 2 (two) times daily. For pain   Yes Historical Provider, MD  allopurinol (ZYLOPRIM) 300 MG tablet Take 300 mg by mouth daily.   Yes Historical Provider, MD  atenolol (TENORMIN) 50 MG tablet One twice daily Patient taking differently: Take 50 mg by mouth 2 (two) times daily.  12/18/12  Yes Tanda Rockers, MD  docusate sodium 100 MG CAPS Take 100 mg by  mouth 2 (two) times daily. 03/19/12  Yes Samuella Cota, MD  ENSURE (ENSURE) Take 237 mLs by mouth daily.   Yes Historical Provider, MD  feeding supplement (PRO-STAT SUGAR FREE 64) LIQD Take 30 mLs by mouth daily.   Yes Historical Provider, MD  furosemide (LASIX) 40 MG tablet Two daily Patient taking differently: Take 40-80 mg by mouth 2 (two) times daily. Take 2 tablet by mouth every morning, 1 tablet by mouth  in the afternoon 07/13/12  Yes Tanda Rockers, MD  hydrALAZINE (APRESOLINE) 25 MG tablet Take 25 mg by mouth 3 (three) times daily.   Yes Historical Provider, MD  isosorbide dinitrate (ISORDIL) 20 MG tablet Take 20 mg by mouth 3 (three) times daily.   Yes Historical Provider,  MD  minoxidil (LONITEN) 10 MG tablet One twice daily Patient taking differently: Take 10 mg by mouth 2 (two) times daily.  07/13/12  Yes Tanda Rockers, MD  Multiple Vitamins-Minerals (CERTAVITE SENIOR/ANTIOXIDANT) TABS Take 1 tablet by mouth daily.   Yes Historical Provider, MD  polyethylene glycol (MIRALAX / GLYCOLAX) packet Take 17 g by mouth daily.    Yes Historical Provider, MD  Skin Protectants, Misc. (EUCERIN) cream Apply 1 application topically daily.    Yes Historical Provider, MD  doxycycline (VIBRA-TABS) 100 MG tablet Take 100 mg by mouth 2 (two) times daily.    Historical Provider, MD   No Known Allergies CBC:    Component Value Date/Time   WBC 6.9 07/06/2014 1450   WBC 8.8 12/17/2012 1218   HGB 10.0* 12/17/2012 1218   HCT 31.8* 07/06/2014 1450   HCT 29.6* 12/17/2012 1218   PLT 185.0 12/17/2012 1218   MCV 96.3 12/17/2012 1218   NEUTROABS 5.3 07/06/2014 1450   NEUTROABS 6.7 12/17/2012 1218   LYMPHSABS 1.0 07/06/2014 1450   LYMPHSABS 1.1 12/17/2012 1218   MONOABS 0.8 12/17/2012 1218   EOSABS 0.1 12/17/2012 1218   BASOSABS 0.0 07/06/2014 1450   BASOSABS 0.1 12/17/2012 1218   Comprehensive Metabolic Panel:    Component Value Date/Time   NA 144 09/21/2014 2147   NA 142 07/06/2014 1450   K  4.3 09/21/2014 2147   CL 105 09/21/2014 2147   CO2 24 09/21/2014 2147   BUN 58* 09/21/2014 2147   BUN 45* 07/06/2014 1450   CREATININE 2.90* 09/21/2014 2159   GLUCOSE 138* 09/21/2014 2147   GLUCOSE 147* 07/06/2014 1450   CALCIUM 9.1 09/21/2014 2147   AST 40 09/21/2014 2147   ALT 17 09/21/2014 2147   ALKPHOS 74 09/21/2014 2147   BILITOT 0.7 09/21/2014 2147   BILITOT 0.4 07/06/2014 1450   PROT 7.5 09/21/2014 2147   PROT 7.3 07/06/2014 1450   ALBUMIN 3.9 09/21/2014 2147    Physical Exam: Vital Signs: BP 108/63 mmHg  Pulse 85  Temp(Src) 98.4 F (36.9 C) (Oral)  Resp 18  Ht 5\' 10"  (1.778 m)  Wt 98.93 kg (218 lb 1.6 oz)  BMI 31.29 kg/m2  SpO2 94% SpO2: SpO2: 94 % O2 Device: O2 Device: Nasal Cannula O2 Flow Rate: O2 Flow Rate (L/min): 2 L/min Intake/output summary:  Intake/Output Summary (Last 24 hours) at 09/22/14 8242 Last data filed at 09/22/14 0915  Gross per 24 hour  Intake   2050 ml  Output      0 ml  Net   2050 ml   LBM: Last BM Date: 09/22/14 Baseline Weight: Weight: 97.977 kg (216 lb) Most recent weight: Weight: 98.93 kg (218 lb 1.6 oz)  Exam Findings:  Elderly appearing AA gentleman, well developed well nourished Clear lungs S1 S2 Abdomen non tender mild distension Awake alert no distress, feeding self breakfast, follows commands.                        Palliative Performance Scale: 40% Additional Data Reviewed: Recent Labs     09/21/14  2147  09/21/14  2159  NA  144   --   BUN  58*   --   CREATININE  3.01*  2.90*     Time In: 0900 Time Out: 0955 Time Total: 55 min Greater than 50%  of this time was spent counseling and coordinating care related to the above assessment and plan.  Signed by: Loistine Chance, MD  [place]    Loistine Chance, MD  09/22/2014, 9:54 AM  Please contact Palliative Medicine Team phone at [phone] for questions and concerns.

## 2014-09-22 NOTE — Progress Notes (Signed)
Report received from ED RN. Patient to be brought to the floor shortly.   Shelbie Hutching, RN, BSN

## 2014-09-22 NOTE — Discharge Instructions (Signed)
Abdominal Aortic Aneurysm An aneurysm is a weakened or damaged part of an artery wall that bulges from the normal force of blood pumping through the body. An abdominal aortic aneurysm is an aneurysm that occurs in the lower part of the aorta, the main artery of the body.  The major concern with an abdominal aortic aneurysm is that it can enlarge and burst (rupture) or blood can flow between the layers of the wall of the aorta through a tear (aorticdissection). Both of these conditions can cause bleeding inside the body and can be life threatening unless diagnosed and treated promptly. CAUSES  The exact cause of an abdominal aortic aneurysm is unknown. Some contributing factors are:   A hardening of the arteries caused by the buildup of fat and other substances in the lining of a blood vessel (arteriosclerosis).  Inflammation of the walls of an artery (arteritis).   Connective tissue diseases, such as Marfan syndrome.   Abdominal trauma.   An infection, such as syphilis or staphylococcus, in the wall of the aorta (infectious aortitis) caused by bacteria. RISK FACTORS  Risk factors that contribute to an abdominal aortic aneurysm may include:  Age older than 60 years.   High blood pressure (hypertension).  Male gender.  Ethnicity (white race).  Obesity.  Family history of aneurysm (first degree relatives only).  Tobacco use. PREVENTION  The following healthy lifestyle habits may help decrease your risk of abdominal aortic aneurysm:  Quitting smoking. Smoking can raise your blood pressure and cause arteriosclerosis.  Limiting or avoiding alcohol.  Keeping your blood pressure, blood sugar level, and cholesterol levels within normal limits.  Decreasing your salt intake. In somepeople, too much salt can raise blood pressure and increase your risk of abdominal aortic aneurysm.  Eating a diet low in saturated fats and cholesterol.  Increasing your fiber intake by including  whole grains, vegetables, and fruits in your diet. Eating these foods may help lower blood pressure.  Maintaining a healthy weight.  Staying physically active and exercising regularly. SYMPTOMS  The symptoms of abdominal aortic aneurysm may vary depending on the size and rate of growth of the aneurysm.Most grow slowly and do not have any symptoms. When symptoms do occur, they may include:  Pain (abdomen, side, lower back, or groin). The pain may vary in intensity. A sudden onset of severe pain may indicate that the aneurysm has ruptured.  Feeling full after eating only small amounts of food.  Nausea or vomiting or both.  Feeling a pulsating lump in the abdomen.  Feeling faint or passing out. DIAGNOSIS  Since most unruptured abdominal aortic aneurysms have no symptoms, they are often discovered during diagnostic exams for other conditions. An aneurysm may be found during the following procedures:  Ultrasonography (A one-time screening for abdominal aortic aneurysm by ultrasonography is also recommended for all men aged 65-75 years who have ever smoked).  X-ray exams.  A computed tomography (CT).  Magnetic resonance imaging (MRI).  Angiography or arteriography. TREATMENT  Treatment of an abdominal aortic aneurysm depends on the size of your aneurysm, your age, and risk factors for rupture. Medication to control blood pressure and pain may be used to manage aneurysms smaller than 6 cm. Regular monitoring for enlargement may be recommended by your caregiver if:  The aneurysm is 3-4 cm in size (an annual ultrasonography may be recommended).  The aneurysm is 4-4.5 cm in size (an ultrasonography every 6 months may be recommended).  The aneurysm is larger than 4.5 cm in   size (your caregiver may ask that you be examined by a vascular surgeon). If your aneurysm is larger than 6 cm, surgical repair may be recommended. There are two main methods for repair of an aneurysm:   Endovascular  repair (a minimally invasive surgery). This is done most often.  Open repair. This method is used if an endovascular repair is not possible. Document Released: 11/21/2004 Document Revised: 06/08/2012 Document Reviewed: 03/13/2012 Exit[employer] Patient Information 2015 [employer]. This information is not intended to replace advice given to you by your health care provider. Make sure you discuss any questions you have with your health care provider.  

## 2014-09-22 NOTE — ED Notes (Signed)
Chaplin in to see the patient and family.

## 2014-09-22 NOTE — Consult Note (Signed)
Vascular Surgery Consultation  Reason for Consult: Ruptured left internal iliac artery aneurysm  HPI: [name] is a 81 y.o. male who presents for evaluation of ruptured left internal iliac artery aneurysm. This patient was found to be unresponsive at the nursing facility and was brought to the emergency department. Evaluation revealed blood pressure 86/60. IV fluid boluses improve the blood pressure to greater than 100. CT scan was obtained which revealed a large-10 cm abdominal aortic aneurysm which extended above the renal arteries. There was also an 8 cm left internal iliac artery aneurysm which had ruptured. Patient denied any abdominal or back pain. Patient was evaluated by Dr. Sherren Mocha early in November 2014. Patient is only a candidate for open repair not a stent graft repair candidate. Patient did not desire surgery and surgery was not recommended by Dr. early. It was decided to discontinue follow-up of these aneurysms. He states that he is ready to "go" when the aneurysm ruptures. He is not instrument and treatment.   Past Medical History  Diagnosis Date  . Diverticulosis     divertic bleed presmed in  2010  . Gout   . Leg swelling     left  . Hyperlipidemia   . History of myocardial infarction   . Olecranon bursitis   . Renal failure   . HTN (hypertension)   . Anemia   . Healthcare maintenance   . Prostate cancer   . AAA (abdominal aortic aneurysm)    Past Surgical History  Procedure Laterality Date  . R ia aneurysmectomy  2000    scar is in RLQ  . Prostate cryoablation  2006  . Suprapubic catheter placement  2006  . Colonoscopy  2006   History   Social History  . Marital Status: Single    Spouse Name: N/A  . Number of Children: N/A  . Years of Education: N/A   Occupational History  . retired    Social History Main Topics  . Smoking status: Never Smoker   . Smokeless tobacco: Never Used  . Alcohol Use: No  . Drug Use: Not on file  . Sexual Activity: Not  on file   Other Topics Concern  . 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 [employer].   Exercise, yes- strengthen and muscle exercises   Living Will- No    DNR- No    POA/HPOA- Yes          Family History  Problem Relation Age of Onset  . Hypertension Brother   . Pancreatic cancer Brother   . Stroke Father   . Stroke Mother   . CAD Mother   . Diabetes Brother   . Breast cancer Sister   . Heart disease Sister   . High blood pressure Sister   . Glaucoma Sister   . High blood pressure Sister   . High Cholesterol Sister   . High blood pressure Sister   . High Cholesterol Sister   . GER disease Sister    No Known Allergies Prior to Admission medications   Medication Sig Start Date End Date Taking? Authorizing Provider  acetaminophen (TYLENOL) 325 MG tablet Take 650 mg by mouth 2 (  two) times daily. For pain   Yes Historical Provider, MD  allopurinol (ZYLOPRIM) 300 MG tablet Take 300 mg by mouth daily.   Yes Historical Provider, MD  atenolol (TENORMIN) 50 MG tablet One twice daily Patient taking differently: Take 50 mg by mouth 2 (two) times daily.  12/18/12  Yes Tanda Rockers, MD  docusate sodium 100 MG CAPS Take 100 mg by mouth 2 (two) times daily. 03/19/12  Yes Samuella Cota, MD  ENSURE (ENSURE) Take 237 mLs by mouth daily.   Yes Historical Provider, MD  feeding supplement (PRO-STAT SUGAR FREE 64) LIQD Take 30 mLs by mouth daily.   Yes Historical Provider, MD  furosemide (LASIX) 40 MG tablet Two daily Patient taking differently: Take 40-80 mg by mouth 2 (two) times daily. Take 2 tablet by mouth every morning, 1 tablet by mouth  in the afternoon 07/13/12  Yes Tanda Rockers, MD  hydrALAZINE (APRESOLINE)  25 MG tablet Take 25 mg by mouth 3 (three) times daily.   Yes Historical Provider, MD  isosorbide dinitrate (ISORDIL) 20 MG tablet Take 20 mg by mouth 3 (three) times daily.   Yes Historical Provider, MD  minoxidil (LONITEN) 10 MG tablet One twice daily Patient taking differently: Take 10 mg by mouth 2 (two) times daily.  07/13/12  Yes Tanda Rockers, MD  Multiple Vitamins-Minerals (CERTAVITE SENIOR/ANTIOXIDANT) TABS Take 1 tablet by mouth daily.   Yes Historical Provider, MD  polyethylene glycol (MIRALAX / GLYCOLAX) packet Take 17 g by mouth daily.    Yes Historical Provider, MD  Skin Protectants, Misc. (EUCERIN) cream Apply 1 application topically daily.    Yes Historical Provider, MD  doxycycline (VIBRA-TABS) 100 MG tablet Take 100 mg by mouth 2 (two) times daily.    Historical Provider, MD     Positive ROS: Unavailable  All other systems have been reviewed and were otherwise negative with the exception of those mentioned in the HPI and as above.  Physical Exam: Filed Vitals:   09/22/14 0015  BP: 177/110  Pulse: 80  Temp:   Resp: 27    General: Alert, no acute distress HEENT: Normal for age Cardiovascular: Regular rate and rhythm. Carotid pulses 2+, no bruits audible Respiratory: Clear to auscultation. No cyanosis, no use of accessory musculature GI: No organomegaly, abdomen is soft and non-tender-diffuse abdominal distention Skin: No lesions in the area of chief complaint Neurologic: Sensation intact distally Psychiatric: Patient is competent for consent with normal mood and affect Musculoskeletal: No obvious deformities Extremities: 2+ femoral pulses palpable bilaterally. Both feet adequately perfused.    Imaging reviewed: CT scan of the chest abdomen and pelvis was reviewed as was the interpretation by the radiologist. The diameter of the aorta at the level of the renal arteries is approximately 4 cm. The infrarenal portion is 10 cm in diameter and the internal iliac  aneurysm on the left is 8 cm in diameter. This has ruptured and there is retroperitoneal blood in the pelvis.   Assessment/Plan:  Patient not an operative candidate for very large left internal iliac aneurysm and abdominal aortic aneurysm with suprarenal component. Has had discussion in the past by Dr. Sherren Mocha early and patient was discharged from follow-up and he does not desire any surgical treatment. I discussed this with him and his brother today and he continues to have that feeling which is appropriate. He is not an operative candidate and would not survive open repair. Would recommend comfort care for this patient. He will likely not  survive this event.   Tinnie Gens, MD 09/22/2014 12:45 AM

## 2014-09-22 NOTE — Progress Notes (Signed)
Report called to [facility]. PTAR to be called by CSW.  Joellen Jersey, RN.

## 2014-09-22 NOTE — Clinical Social Work Note (Signed)
[name] is being discharged back to [facility] today. Hospice Services will be arranged by ALF staff and this instruction was placed in the d/c summary. Nursing Director, Dana Allan contacted and informed of discharge and d/c paperwork transmitted to facility. Patient/family advised of discharge and ambulance transport arranged.   [name], MSW, LCSW Licensed Clinical Social Worker [place] [phone]

## 2014-09-22 NOTE — Progress Notes (Signed)
Admission note:  Arrival Method: Pt arrived to unit on stretcher with nurse tech Mental Orientation: Alert and oriented x 4 with occasional confusion Telemetry: N/A Assessment: Completed, see doc flowsheets Skin: Skin dry and intact. Unable to assess patient's back due to patient not being able to turn without causing discomfort and patient is comfort care.  IV: Right hand and forearm IV's, saline locked. Site is clean, dry and intact.  Pain: Pt states no pain at this time, but educated to notify RN if any pain should occur.  Tubes: N/A Safety Measures: Bed in lowest position, call light within reach, non-slip socks placed.  Admission Screening: Unable to complete admission at this time. Pt sleeping comfortably and no family present at this time to answer questions. Will attempt again once patient is awake.  6700 Orientation: Patient has been oriented to the unit, staff and to the room. Patient is lying comfortably in bed with no needs stated at this time. Orders have been reviewed and implemented. Call light within reach, will continue to monitor.  Shelbie Hutching, RN, BSN

## 2014-09-22 NOTE — Progress Notes (Signed)
   09/22/14 0800  Clinical Encounter Type  Visited With Patient;Family  Visit Type Initial;Patient actively dying  Referral From Nurse  Consult/Referral To Chaplain  Spiritual Encounters  Spiritual Needs Prayer;Emotional  RN suggested that pt needed prayer; [facility] responded and met with pt and brother; pt understands situation;

## 2014-09-23 ENCOUNTER — Telehealth: Payer: Self-pay | Admitting: *Deleted

## 2014-09-23 NOTE — Telephone Encounter (Signed)
[name] called back and stated that she had the Hospice Provider order. Disregard.

## 2014-09-23 NOTE — Telephone Encounter (Signed)
Meredith with Hospice called and stated that patient has been admitted to Hospice today and they need order for a pain medication and anxiety medication. Please Advise.

## 2014-09-24 NOTE — Progress Notes (Signed)
Charged Nurse received positive lab result,traced the patient to [facility],spoke to nurse ,Davina Poke [place] fax number [phone], to send the report into their facility,but we tried to three time ,all were unsuccessful.Called back the facilities and gave [place] new fax number [phone].Will try to send the positive lab result thru by fax.

## 2014-09-24 NOTE — Progress Notes (Signed)
Positive lab result send thru this fax no. [phone] able to get thru,called Carey-the receptionist,instructed her to watch for it and gave to the nurse.Leave our phone number for call back verification.

## 2014-09-26 LAB — CULTURE, BLOOD (ROUTINE X 2): Culture: NO GROWTH

## 2014-09-27 LAB — CULTURE, BLOOD (ROUTINE X 2)

## 2014-09-29 ENCOUNTER — Telehealth: Payer: Self-pay | Admitting: *Deleted

## 2014-09-29 NOTE — Telephone Encounter (Signed)
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 He has been talking and activity has increased, so things are looking up stated the hospice nurse.

## 2014-10-27 DEATH — deceased

## 2014-12-06 ENCOUNTER — Telehealth: Payer: Self-pay | Admitting: *Deleted

## 2014-12-06 NOTE — Telephone Encounter (Signed)
[name] from [facility] dropped off DNR forms. We wasn't sure why since patient expired. Called [facility] [phone] and spoke with Sharee Pimple and she stated that they have a signed copy of a DNR in patient's file to discard the blank ones we had. Discarded.

## 2014-12-28 ENCOUNTER — Encounter: Payer: Medicare Other | Admitting: Internal Medicine
